# Patient Record
Sex: Female | Born: 2000
Health system: Southern US, Community
[De-identification: ages and names within clinical notes are randomized; demographics above are authoritative.]

## PROBLEM LIST (undated history)

## (undated) DIAGNOSIS — E119 Type 2 diabetes mellitus without complications: Secondary | ICD-10-CM

## (undated) DIAGNOSIS — E079 Disorder of thyroid, unspecified: Secondary | ICD-10-CM

## (undated) DIAGNOSIS — R011 Cardiac murmur, unspecified: Secondary | ICD-10-CM

## (undated) DIAGNOSIS — D649 Anemia, unspecified: Secondary | ICD-10-CM

## (undated) HISTORY — DX: Cardiac murmur, unspecified: R01.1

## (undated) HISTORY — DX: Type 2 diabetes mellitus without complications: E11.9

## (undated) HISTORY — DX: Disorder of thyroid, unspecified: E07.9

## (undated) HISTORY — DX: Anemia, unspecified: D64.9

---
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
## 2018-01-18 ENCOUNTER — Other Ambulatory Visit: Payer: Self-pay | Admitting: Internal Medicine

## 2018-01-18 MED FILL — FIASP 100 UNIT/ML SOLN: 100 | 28 days supply | Qty: 20 | Fill #11

## 2018-01-20 MED FILL — TRI FEMYNOR 28 TABLET: 0.18/0.215/ | 84 days supply | Qty: 84 | Fill #0

## 2018-02-02 DIAGNOSIS — E1065 Type 1 diabetes mellitus with hyperglycemia: Secondary | ICD-10-CM | POA: Diagnosis not present

## 2018-02-02 DIAGNOSIS — E1042 Type 1 diabetes mellitus with diabetic polyneuropathy: Secondary | ICD-10-CM | POA: Diagnosis not present

## 2018-02-16 DIAGNOSIS — E1065 Type 1 diabetes mellitus with hyperglycemia: Secondary | ICD-10-CM | POA: Diagnosis not present

## 2018-02-16 DIAGNOSIS — E1042 Type 1 diabetes mellitus with diabetic polyneuropathy: Secondary | ICD-10-CM | POA: Diagnosis not present

## 2018-02-16 MED FILL — FIASP 100 UNIT/ML SOLN: 100 | 28 days supply | Qty: 20 | Fill #12

## 2018-03-14 ENCOUNTER — Encounter: Payer: Self-pay | Admitting: Internal Medicine

## 2018-03-14 ENCOUNTER — Ambulatory Visit (INDEPENDENT_AMBULATORY_CARE_PROVIDER_SITE_OTHER): Payer: 59 | Admitting: Internal Medicine

## 2018-03-14 ENCOUNTER — Encounter

## 2018-03-14 VITALS — BP 118/60 | HR 68 | Ht 63.0 in | Wt 170.0 lb

## 2018-03-14 DIAGNOSIS — E039 Hypothyroidism, unspecified: Secondary | ICD-10-CM

## 2018-03-14 DIAGNOSIS — E1042 Type 1 diabetes mellitus with diabetic polyneuropathy: Secondary | ICD-10-CM

## 2018-03-14 LAB — TSH: TSH: 5.29 u[IU]/mL — AB (ref 0.40–5.00)

## 2018-03-14 LAB — POCT GLYCOSYLATED HEMOGLOBIN (HGB A1C): Hemoglobin A1C: 8 % — AB (ref 4.0–5.6)

## 2018-03-14 LAB — T4, FREE: Free T4: 0.95 ng/dL (ref 0.60–1.60)

## 2018-03-14 MED ORDER — GLUCAGON 3 MG/DOSE NA POWD: 3.0000 mg | Freq: Once | NASAL | 11 refills | Status: DC | PRN

## 2018-03-14 MED FILL — BAQSIMI TWO PACK 3 MG/DOSE: 3 | 30 days supply | Qty: 2 | Fill #0

## 2018-03-14 NOTE — Progress Notes (Signed)
Patient ID: [name], female   DOB: 2000-04-29, 18 y.o.   MRN: [id number]   HPI: Melissa Reese is a 18 y.o.-year-old female, referred by her PCP, Dr. Juleen Reese, for management of DM1  Dx 2003, uncontrolled, with complications (PN). Prev. seen at [facility], but has not followed with them for a longer period of time. Last visit with me 11 months ago.     She is now in the second year of college and living out of campus.  Schedule is still very busy, but at least she now has time to do her own grocery and occasionally cook meals.  She is on an insulin pump - Started at 18 years old - OneTouch ping - started 01/2015 - Currently on 670G Medtronic + CGM - started 09/2016 (band aid to keep the CGM in place: Simplatch). She had a crck in her pump - new pump 01/2018.  She was off the CGM before and over the holidays, now back on the CGM starting 03/2018.   She uses Humalog in the pump.  DM1:  Last hemoglobin A1c was: Lab Results  Component Value Date   HGBA1C 8.7 (H) 08/24/2017   HGBA1C 8.9 04/09/2017   HGBA1C 8.4 12/22/2016  Prev.  02/20/2016: HbA1c 7.2% 11/21/2015: HbA1c 7.4% 08/21/2015: HbA1c 7.7% 04/24/2015: HbA1c 7.8%  Pump settings: - basal rates: 12 am: 1.4 units/h 3 am: 1.3 6 am: 1.3 11 am: 1.1 5 pm: 1.15 9 pm: 1.25 - ICR:   12 am: 6  5:30 am: 6  10 am: 6  3 pm: 5 - target: I recommended to change these at last visit but she did not use the recommended targets as she forgot to enter them in the new pump: 12 am: 130-150  5:30 am: 115-135 9 pm: 130-150 - ISF:   12 am: 45  5:30: 40  9 pm: 45 - Insulin on Board: 4h - bolus wizard: on TDD from basal insulin: 51% >> 77% (30 units TDD from bolus insulin: 49% >> 23% (9 units) TDD only 40 units now, but she will need to use much more than this if she restarts bolusing consistently - extended bolusing: not using - changes infusion site: q 4 days, however, she only changes the insulin at approximately 7  days  Pt checks her sugars 1.5 times a day and they are:   CGM parameters:  - Average from CGM: 162+/-61 >> 190+/-70 >> 174+/-72 - Average from manual BG checks: 210+/-87 >> 276+/-106 >> 197+/-86  Time in range:  - very low (40-50): 0% >> 0% >> 1% - low (50-70): 4% >> 1% >> 2% - normal range (70-180): 63% >> 49% >> 51% - high sugars (180-250): 23% >> 30% >> 32% - very high sugars (250-400): 10% >> 20% >> 14%  - in auto mode: 13% >> 25% >> 0% - in manual mode: 87% >> 75% >> 100%  Lowest sugar: 55 at night >> 40s >> 40s. She has hypoglycemia awareness at 80. + prev. hypoglycemia visit to ED -but years ago.  She does have a non-expired glucagon kit at home. She used this when younger - 2x, not recently. Highest sugar: 377 >> >400 >> low 300s.  No prev.DKA admissions.  - No CKD, last BUN/creatinine:  Lab Results  Component Value Date   BUN 7 08/24/2017   BUN 9 09/07/2016   CREATININE 0.84 08/24/2017   CREATININE 0.98 09/07/2016  Not on an ACEI/ARB. - No HL; last set of  lipids: Lab Results  Component Value Date   CHOL 210 (H) 08/24/2017   HDL 52.20 08/24/2017   LDLCALC 128 (H) 08/24/2017   TRIG 152.0 (H) 08/24/2017   CHOLHDL 4 08/24/2017  08/21/2015: 145/148/45/76 Not on a statin. - last eye exam was in 07/2017: No DR - + numbness and tingling in her feet.  Off Neurontin.  Hypothyroidism:  Pt is on levothyroxine 150 mcg daily, taken: - in am - fasting - at least 30 min from b'fast - no Ca, Fe, MVI, PPIs - not on Biotin  Latest TSH normal: Lab Results  Component Value Date   TSH 4.16 10/11/2017   TSH 21.48 (H) 08/24/2017   TSH 0.813 04/09/2017   TSH 0.20 (L) 12/22/2016   TSH 44.95 Repeated and verified X2. (H) 09/17/2016   TSH 33.00 (H) 09/07/2016  11/21/2015: TSH 6.397 08/21/2015: TSH 41.268 04/24/2015: TSH 14.788 08/16/2014: TSH 0.407  Pt denies: - feeling nodules in neck - hoarseness - dysphagia - choking - SOB with lying down  Patient started  college in August 2018.  ROS: Constitutional: no weight gain/no weight loss, + fatigue, no subjective hyperthermia, no subjective hypothermia Eyes: no blurry vision, no xerophthalmia ENT: no sore throat, + see HPI Cardiovascular: no CP/no SOB/no palpitations/no leg swelling Respiratory: no cough/no SOB/no wheezing Gastrointestinal: no N/no V/no D/no C/no acid reflux Musculoskeletal: no muscle aches/no joint aches Skin: no rashes, no hair loss Neurological: no tremors/+ numbness/+ tingling/no dizziness  I reviewed pt's medications, allergies, PMH, social hx, family hx, and changes were documented in the history of present illness. Otherwise, unchanged from my initial visit note.  Past Medical History:  Diagnosis Date  . Diabetes mellitus without complication ([place])   . Thyroid disease     Social History   Social History  . Marital status: Single    Spouse name: N/A  . Number of children: 0   Occupational History  . student   Social History Main Topics  . Smoking status: Never Smoker  . Smokeless tobacco: Never Used  . Alcohol use No  . Drug use: No   Current Outpatient Medications on File Prior to Visit  Medication Sig Dispense Refill  . azelastine (ASTELIN) 0.1 % nasal spray     . FIASP 100 UNIT/ML injection Use up to 70 units per day in pump 120 mL 11  . glucose blood (CONTOUR NEXT TEST) test strip Use as instructed to check sugar 4 times daily 400 each 5  . insulin lispro (HUMALOG) 100 UNIT/ML injection Use up to 90 units a day in the pump 30 mL 1  . levothyroxine (SYNTHROID, LEVOTHROID) 150 MCG tablet TAKE 1 TABLET (150 MCG TOTAL) BY MOUTH DAILY. 30 tablet 2  . TRI FEMYNOR 0.18/0.215/0.25 MG-35 MCG tablet Take 1 tablet by mouth daily.  99   No current facility-administered medications on file prior to visit.    No Known Allergies Family History  Problem Relation Age of Onset  . Allergies Father    PE: BP 118/60   Pulse 68   Ht '5\' 3"'  (1.6 m) Comment: measured   Wt 170 lb (77.1 kg)   SpO2 97%   BMI 30.11 kg/m  Wt Readings from Last 3 Encounters:  03/14/18 170 lb (77.1 kg) (92 %, Z= 1.38)*  08/24/17 170 lb 6.4 oz (77.3 kg) (92 %, Z= 1.42)*  04/09/17 164 lb (74.4 kg) (90 %, Z= 1.30)*   * Growth percentiles are based on CDC (Girls, 2-20 Years) data.   Constitutional:  overweight, in NAD Eyes: PERRLA, EOMI, no exophthalmos ENT: moist mucous membranes, no thyromegaly, no cervical lymphadenopathy Cardiovascular: RRR, No MRG Respiratory: CTA B Gastrointestinal: abdomen soft, NT, ND, BS+ Musculoskeletal: no deformities, strength intact in all 4 Skin: moist, warm, no rashes Neurological: no tremor with outstretched hands, DTR normal in all 4  ASSESSMENT: 1. DM1, uncontrolled, with complications - PN  2. Hypothyroidism  PLAN:  1. Patient with longstanding, uncontrolled, type 1 diabetes, on insulin pump therapy, returning after long absence.  She is in college in [place] (approximately 30 minutes away from here) and she feels that she is doing a little better with her meals compared to last visit.  She is usually using low carb meals and she is barely bolusing with him.  We discussed that if she eats a high-protein diet, she will need to enter approximately 50% of the protein amount is carbs.  We reviewed together her pump and CGM downloads (which was scanned) and I demonstrated to her how high her sugars get after a meal if she does not bolus for them.  She has days in which she does not bolus at all and if she does, she only does few units, without using the bolus wizard.  I explained that before every meal she will need the number of carbs, the sugar, and the bolus started at 15 minutes before each meal, ideally.  She will try to do so. -Another possible problem is that she is changing her insulin in the reservoir approximately once a week and I advised her that this is not acceptable as the insulin may be great.  We discussed about how much insulin  to begin the pump if she plans to change the reservoir every 3 days.  -I again advised her to lower the targets as I suggested at last visit but she did not do yet. -It is difficult for me to evaluate her insulin to carb ratio since she is not bolusing correctly with meals. -We will continue the same basal rates for now. -Ideally she will stay in the auto mode.  As of now, she is 100% in the manual mode.  She agrees with this and will try to connected to the auto mode. - We discussed about changes to his insulin regimen, as follows:  Patient Instructions  Please continue - basal rates: 12 am: 1.4 units/h 3 am: 1.3 6 am: 1.3 11 am: 1.1 5 pm: 1.15 9 pm: 1.25 - ICR:   12 am: 6  5:30 am: 6  10 am: 6  3 pm: 5 - target:  12 am: 130-150 >> 130-130 5:30 am: 115-135 >> 115-115 9 pm: 130-150 >> 130-130 - ISF:   12 am: 45  5:30: 40  9 pm: 45 - Insulin on Board: 4h - bolus wizard: on  Please do the following approximately 15 minutes before every meal: - Enter carbs (C) - Enter sugars (S) - Start insulin bolus (I)  If you have a low carb, high protein meal, may need to enter 50% protein quantity as carbs.  - today, HbA1c is 8% (better) - continue checking sugars at different times of the day - check 4x a day, rotating checks - advised for yearly eye exams >> she is UTD -Sent a glucagon prescription to her pharmacy.  We will try the intranasal glucagon. - Return to clinic in 4 mo with sugar log    2. Hypothyroidism Patient with poorly controlled longstanding hypothyroidism, on levothyroxine therapy.  She had very  fluctuating TFTs in the past, likely secondary to lack of compliance with the levothyroxine dose. - latest thyroid labs reviewed with pt >> normal, but with a TSH of the upper limit of normal in 09/2017 - she continues on LT4 150 mcg daily - pt feels good on this dose, but does feel fatigue and she would like the test rechecked today. - we discussed about taking the  thyroid hormone every day, with water, >30 minutes before breakfast, separated by >4 hours from acid reflux medications, calcium, iron, multivitamins. Pt. is taking it correctly. - will check thyroid tests today: TSH and fT4 - If labs are abnormal, she will need to return for repeat TFTs in 1.5 months  - time spent with the patient: 40 min, of which >50% was spent in reviewing her pump and CGM downloads, discussing her hypo- and hyper-glycemic episodes, reviewing previous labs and pump settings and developing a plan to avoid hypo- and hyper-glycemia.  We also addressed her hypothyroidism.  Component     Latest Ref Rng & Units 03/14/2018  TSH     0.40 - 5.00 uIU/mL 5.29 (H)  T4,Free(Direct)     0.60 - 1.60 ng/dL 0.95   TSH slightly high >> will increase LT4 to 175 mcg daily. Needs repeat TFTs in 1.5 mo.  Philemon Kingdom, MD PhD [facility]

## 2018-03-14 NOTE — Patient Instructions (Addendum)
Please continue - basal rates: 12 am: 1.4 units/h 3 am: 1.3 6 am: 1.3 11 am: 1.1 5 pm: 1.15 9 pm: 1.25 - ICR:   12 am: 6  5:30 am: 6  10 am: 6  3 pm: 5 - target:  12 am: 130-150 >> 130-130 5:30 am: 115-135 >> 115-115 9 pm: 130-150 >> 130-130 - ISF:   12 am: 45  5:30: 40  9 pm: 45 - Insulin on Board: 4h - bolus wizard: on  Please do the following approximately 15 minutes before every meal: - Enter carbs (C) - Enter sugars (S) - Start insulin bolus (I)  If you have a low carb, high protein meal, may need to enter 50% protein quantity as carbs.

## 2018-03-15 MED ORDER — LEVOTHYROXINE SODIUM 175 MCG PO TABS: 175.0000 ug | ORAL_TABLET | Freq: Every day | ORAL | 3 refills | Status: DC

## 2018-03-15 MED FILL — LEVOTHYROXINE 175 MCG TABLE: 175 | 45 days supply | Qty: 45 | Fill #0

## 2018-03-15 MED FILL — FIASP 100 UNIT/ML SOLN: 100 | 28 days supply | Qty: 20 | Fill #13

## 2018-04-14 ENCOUNTER — Other Ambulatory Visit: Payer: Self-pay | Admitting: Internal Medicine

## 2018-04-15 ENCOUNTER — Telehealth: Payer: Self-pay | Admitting: Internal Medicine

## 2018-04-15 ENCOUNTER — Telehealth: Payer: Self-pay

## 2018-04-15 ENCOUNTER — Ambulatory Visit (INDEPENDENT_AMBULATORY_CARE_PROVIDER_SITE_OTHER): Payer: Self-pay | Admitting: Nurse Practitioner

## 2018-04-15 VITALS — BP 116/72 | HR 58 | Temp 98.6°F | Resp 20 | Wt 166.0 lb

## 2018-04-15 DIAGNOSIS — B9689 Other specified bacterial agents as the cause of diseases classified elsewhere: Secondary | ICD-10-CM

## 2018-04-15 DIAGNOSIS — J019 Acute sinusitis, unspecified: Secondary | ICD-10-CM

## 2018-04-15 MED ORDER — AMOXICILLIN-POT CLAVULANATE 875-125 MG PO TABS: 1.0000 | ORAL_TABLET | Freq: Two times a day (BID) | ORAL | 0 refills | Status: AC

## 2018-04-15 MED ORDER — IPRATROPIUM BROMIDE 0.03 % NA SOLN: 2.0000 | Freq: Two times a day (BID) | NASAL | 12 refills | Status: DC

## 2018-04-15 MED FILL — FIASP 100 UNIT/ML SOLN: 100 | 28 days supply | Qty: 20 | Fill #0

## 2018-04-15 MED FILL — IPRATROPIUM 0.03% SPRAY: 0.03 | 43 days supply | Qty: 30 | Fill #0

## 2018-04-15 MED FILL — AMOX-CLAV 875-125 MG TABLET: 875-125 | 7 days supply | Qty: 14 | Fill #0

## 2018-04-15 NOTE — Progress Notes (Signed)
Subjective:    Patient ID: Melissa Reese, female    DOB: 2000/09/24, 18 y.o.   MRN: [id number]  The patient is a 18 year old female who presents today for complaints of headache, cough, nasal congestion, and sinus pressure.  Patient informs her symptoms have been going on for about 7 days.  The patient denies fever, ear pain, ear drainage, productive cough, abdominal pain, nausea or vomiting.  Patient also complains of postnasal drip.  The patient does have a long history of type 1 diabetes, since the age of 18, which has been uncontrolled.  The patient informs that this morning she did have some hypoglycemic episodes with her blood sugars running in the 60s.  Patient states that she had to eat to get her blood sugars up, and her blood sugars currently 257 in the office.  Patient also has a history of hypothyroidism for which she takes Synthroid for. Patient's synthroid dose was also adjusted in January, 2020. The patient's last A1c in January, 2020 was 8.  Patient informs that when she gets sick her blood sugars normally tend to trend low instead of trending high compared to most diabetics.  States her blood sugars have been trending low over the past week.  The patient is in college, and stays off campus.  Patient states several of her cheerleader friends have been sick with same or similar symptoms.    Sinusitis  This is a new problem. The current episode started 1 to 4 weeks ago. The problem has been gradually worsening since onset. There has been no fever. Her pain is at a severity of 6/10. The pain is moderate. Associated symptoms include congestion, coughing, headaches, sinus pressure and a sore throat ( worsens with cough, "feels like a tickle in the back of my throat"). Pertinent negatives include no chills, ear pain, hoarse voice or shortness of breath. Treatments tried: Robitussin cough medicine at bedtime. The treatment provided mild relief.   Past Medical History:  Diagnosis Date  .  Diabetes mellitus without complication ([facility])   . Thyroid disease     Review of Systems  Constitutional: Positive for fatigue. Negative for activity change, appetite change, chills and fever.  HENT: Positive for congestion, postnasal drip, rhinorrhea, sinus pressure, sinus pain and sore throat ( worsens with cough, "feels like a tickle in the back of my throat"). Negative for ear pain and hoarse voice.   Eyes: Negative.   Respiratory: Positive for cough. Negative for shortness of breath and wheezing.   Cardiovascular: Negative.   Gastrointestinal: Negative.   Skin: Negative.   Allergic/Immunologic: Negative.   Neurological: Positive for headaches. Negative for dizziness, tremors, seizures, facial asymmetry, weakness, light-headedness and numbness.       Objective:   Physical Exam Vitals signs reviewed.  Constitutional:      General: She is not in acute distress. HENT:     Head: Normocephalic.     Right Ear: Tympanic membrane, ear canal and external ear normal.     Left Ear: Tympanic membrane, ear canal and external ear normal.     Nose: Mucosal edema and congestion (moderate) present. No rhinorrhea.     Right Turbinates: Enlarged and swollen.     Left Turbinates: Enlarged and swollen.     Right Sinus: Maxillary sinus tenderness and frontal sinus tenderness present.     Left Sinus: Maxillary sinus tenderness and frontal sinus tenderness present.     Mouth/Throat:     Lips: Pink.     Mouth: Mucous  membranes are moist.     Pharynx: Posterior oropharyngeal erythema present. No pharyngeal swelling, oropharyngeal exudate or uvula swelling.     Tonsils: No tonsillar exudate or tonsillar abscesses. Swelling: 0 on the right. 0 on the left.  Eyes:     Pupils: Pupils are equal, round, and reactive to light.  Neurological:     Mental Status: She is alert.       Assessment & Plan:   Exam findings, diagnosis etiology and medication use and indications reviewed with patient. Follow- Up  and discharge instructions provided. No emergent/urgent issues found on exam.  Based on the patient's clinical presentation, symptoms, physical assessment, and contributing past medical history, I am going to go ahead and place the patient on antibiotics for her symptoms.  Patient symptoms are likely bacterial, but I would like to cover the patient as she is immunocompromised and do not want to risk any co-infection or secondary infection..  The patient has moderate sinus tenderness and gradual worsening of symptoms. Patient will continue Robitussin for her cough and will utilize symptomatic treatment to include Ibuprofen or Tylenol for pain, fever or general discomfort, increase fluids, get plenty of rest, nasal spray and normal saline nasal spray for congestion.  I have asked the patient to also monitor her blood sugars closely for hypo/hyperglycemic episodes.  If patient does not improve, I would like to her to follow up with her PCP or endocrinologist accordingly.   Patient education was provided. Patient verbalized understanding of information provided and agrees with plan of care (POC), all questions answered. The patient is advised to call or return to clinic if condition does not see an improvement in symptoms, or to seek the care of the closest emergency department if condition worsens with the above plan.   1. Acute bacterial sinusitis  - amoxicillin-clavulanate (AUGMENTIN) 875-125 MG tablet; Take 1 tablet by mouth 2 (two) times daily for 7 days.  Dispense: 14 tablet; Refill: 0 - ipratropium (ATROVENT) 0.03 % nasal spray; Place 2 sprays into both nostrils every 12 (twelve) hours.  Dispense: 30 mL; Refill: 12 -Take medication as prescribed. -Ibuprofen or Tylenol for pain, fever, or general discomfort. -Increase fluids. -Sleep elevated on at least 2 pillows at bedtime to help with cough. -Use a humidifier or vaporizer when at home and during sleep. -May use a teaspoon of honey or over-the-counter  cough drops to help with cough. -May use normal saline nasal spray to help with nasal congestion throughout the day. -Monitor your blood glucose closely for hypo/hyperglycemia.  Follow up with PCP or endocrinologist if symptoms worsen or do not improve.  -Follow-up with your PCP or endocrinologist if symptoms do not improve.

## 2018-04-15 NOTE — Telephone Encounter (Signed)
Notified patient and her father of message from Dr. Elvera Lennox, they both expressed understanding and agreement. No further questions.

## 2018-04-15 NOTE — Telephone Encounter (Signed)
Called and left voicemail for pt's father to return our call.

## 2018-04-15 NOTE — Telephone Encounter (Signed)
Dad has returned the call from [place].    Please call [phone]

## 2018-04-15 NOTE — Telephone Encounter (Signed)
error 

## 2018-04-15 NOTE — Telephone Encounter (Signed)
Called pt's father back after I missed his call, and he stated that he was with the pt now and she is doing better. Pt stated that she has not set up her account yet so she can upload her readings, but she is coming to her PCP in [place] today because she has a "severe" cold. Pt's father stated that he would come here first and allow staff in the office to download her pump for Dr. Elvera Lennox to see the readings. Pt was also scheduled for 04/19/2018 at 0930 to see Dr. Elvera Lennox.

## 2018-04-15 NOTE — Telephone Encounter (Signed)
Can she upload her pump? I would like to look at the patterns before the weekend?  Any possible culprits or particular patternsshe can identify? If she cannot download >> are sugars dropping before or after meals or at night?   If she cannot upload, I would like to see her and download her pump here - 9:30 on Tue? - be here earlier (latest: 9:10 to download her pump)

## 2018-04-15 NOTE — Telephone Encounter (Signed)
Per Dr. Elvera Lennox:  Patient needs to change ALL insulin to carb ratio to 1:7  LM for patient's father to return my call.

## 2018-04-15 NOTE — Telephone Encounter (Signed)
Pt's father stated that his daughter's blood sugars have been running very low the last day or so. He stated that last night, her blood sugars were consistently running 35-40 throughout the night.  This morning and currently, her blood sugars have been running anywhere between 35-65. Her blood sugar now is 65 and the patient is currently drinking Orange Juice.  Pt's father stated that prior to calling, the patient was incoherent and vomiting, but currently experiencing no vomiting as of this time.  Pt's father was advised that calling EMS would be the best option at this time for the patient. Pt's father denied needing EMS at this current time and stated that his daughter was 30 minutes away at college, and he was on his way to her at this present time.  Pt's father did question about the recall on the Medtronic 670 insulin pump, which the patient is currently using. Pt's father stated that he would immediately check the plastic piece that was recalled on the insulin pump and call us and then [employer]. Pt's father questioned whether he should bring the pt here, and he was advised that there is little we can do for her with blood sugars at this level, and EMS would be better equipped to handle her condition as it is currently.  Pt's father verbalized understanding. He was then advised that if he was uncertain of anything at all, and if the pt's condition worsens, if BS drops again, if vomiting resumes, or if he or the pt is concerned in any way further, immediately call EMS at 911 and have the pt taken to the hospital. Pt's father verbalized understanding of this, and once he is with the pt, he will call and update.

## 2018-04-15 NOTE — Telephone Encounter (Signed)
OK, Thank you, Anette Riedel!

## 2018-04-15 NOTE — Patient Instructions (Addendum)
Sinusitis, Adult -Take medication as prescribed. -Ibuprofen or Tylenol for pain, fever, or general discomfort. -Increase fluids. -Sleep elevated on at least 2 pillows at bedtime to help with cough. -Use a humidifier or vaporizer when at home and during sleep. -May use a teaspoon of honey or over-the-counter cough drops to help with cough. -May use normal saline nasal spray to help with nasal congestion throughout the day. -Monitor your blood glucose closely for hypo/hyperglycemia.  Follow up with PCP or endocrinologist if symptoms worsen or do not improve.  -Follow-up with your PCP or endocrinologist if symptoms do not improve.    Sinusitis is inflammation of your sinuses. Sinuses are hollow spaces in the bones around your face. Your sinuses are located:  Around your eyes.  In the middle of your forehead.  Behind your nose.  In your cheekbones. Mucus normally drains out of your sinuses. When your nasal tissues become inflamed or swollen, mucus can become trapped or blocked. This allows bacteria, viruses, and fungi to grow, which leads to infection. Most infections of the sinuses are caused by a virus. Sinusitis can develop quickly. It can last for up to 4 weeks (acute) or for more than 12 weeks (chronic). Sinusitis often develops after a cold. What are the causes? This condition is caused by anything that creates swelling in the sinuses or stops mucus from draining. This includes:  Allergies.  Asthma.  Infection from bacteria or viruses.  Deformities or blockages in your nose or sinuses.  Abnormal growths in the nose (nasal polyps).  Pollutants, such as chemicals or irritants in the air.  Infection from fungi (rare). What increases the risk? You are more likely to develop this condition if you:  Have a weak body defense system (immune system).  Do a lot of swimming or diving.  Overuse nasal sprays.  Smoke. What are the signs or symptoms? The main symptoms of this  condition are pain and a feeling of pressure around the affected sinuses. Other symptoms include:  Stuffy nose or congestion.  Thick drainage from your nose.  Swelling and warmth over the affected sinuses.  Headache.  Upper toothache.  A cough that may get worse at night.  Extra mucus that collects in the throat or the back of the nose (postnasal drip).  Decreased sense of smell and taste.  Fatigue.  A fever.  Sore throat.  Bad breath. How is this diagnosed? This condition is diagnosed based on:  Your symptoms.  Your medical history.  A physical exam.  Tests to find out if your condition is acute or chronic. This may include: ? Checking your nose for nasal polyps. ? Viewing your sinuses using a device that has a light (endoscope). ? Testing for allergies or bacteria. ? Imaging tests, such as an MRI or CT scan. In rare cases, a bone biopsy may be done to rule out more serious types of fungal sinus disease. How is this treated? Treatment for sinusitis depends on the cause and whether your condition is chronic or acute.  If caused by a virus, your symptoms should go away on their own within 10 days. You may be given medicines to relieve symptoms. They include: ? Medicines that shrink swollen nasal passages (topical intranasal decongestants). ? Medicines that treat allergies (antihistamines). ? A spray that eases inflammation of the nostrils (topical intranasal corticosteroids). ? Rinses that help get rid of thick mucus in your nose (nasal saline washes).  If caused by bacteria, your health care provider may recommend waiting  to see if your symptoms improve. Most bacterial infections will get better without antibiotic medicine. You may be given antibiotics if you have: ? A severe infection. ? A weak immune system.  If caused by narrow nasal passages or nasal polyps, you may need to have surgery. Follow these instructions at home: Medicines  Take, use, or apply  over-the-counter and prescription medicines only as told by your health care provider. These may include nasal sprays.  If you were prescribed an antibiotic medicine, take it as told by your health care provider. Do not stop taking the antibiotic even if you start to feel better. Hydrate and humidify   Drink enough fluid to keep your urine pale yellow. Staying hydrated will help to thin your mucus.  Use a cool mist humidifier to keep the humidity level in your home above 50%.  Inhale steam for 10-15 minutes, 3-4 times a day, or as told by your health care provider. You can do this in the bathroom while a hot shower is running.  Limit your exposure to cool or dry air. Rest  Rest as much as possible.  Sleep with your head raised (elevated).  Make sure you get enough sleep each night. General instructions   Apply a warm, moist washcloth to your face 3-4 times a day or as told by your health care provider. This will help with discomfort.  Wash your hands often with soap and water to reduce your exposure to germs. If soap and water are not available, use hand sanitizer.  Do not smoke. Avoid being around people who are smoking (secondhand smoke).  Keep all follow-up visits as told by your health care provider. This is important. Contact a health care provider if:  You have a fever.  Your symptoms get worse.  Your symptoms do not improve within 10 days. Get help right away if:  You have a severe headache.  You have persistent vomiting.  You have severe pain or swelling around your face or eyes.  You have vision problems.  You develop confusion.  Your neck is stiff.  You have trouble breathing. Summary  Sinusitis is soreness and inflammation of your sinuses. Sinuses are hollow spaces in the bones around your face.  This condition is caused by nasal tissues that become inflamed or swollen. The swelling traps or blocks the flow of mucus. This allows bacteria, viruses,  and fungi to grow, which leads to infection.  If you were prescribed an antibiotic medicine, take it as told by your health care provider. Do not stop taking the antibiotic even if you start to feel better.  Keep all follow-up visits as told by your health care provider. This is important. This information is not intended to replace advice given to you by your health care provider. Make sure you discuss any questions you have with your health care provider. Document Released: 02/16/2005 Document Revised: 07/19/2017 Document Reviewed: 07/19/2017 Elsevier Interactive Patient Education  2019 [employer].

## 2018-04-18 MED FILL — TRI FEMYNOR 28 TABLET: 0.18/0.215/ | 84 days supply | Qty: 84 | Fill #0

## 2018-04-19 ENCOUNTER — Ambulatory Visit: Payer: 59 | Admitting: Internal Medicine

## 2018-04-25 DIAGNOSIS — E1042 Type 1 diabetes mellitus with diabetic polyneuropathy: Secondary | ICD-10-CM | POA: Diagnosis not present

## 2018-04-25 DIAGNOSIS — E1065 Type 1 diabetes mellitus with hyperglycemia: Secondary | ICD-10-CM | POA: Diagnosis not present

## 2018-05-02 MED FILL — LEVOTHYROXINE 175 MCG TABLE: 175 | 45 days supply | Qty: 45 | Fill #1 | Status: TO

## 2018-05-11 ENCOUNTER — Other Ambulatory Visit: Payer: Self-pay

## 2018-05-11 ENCOUNTER — Ambulatory Visit (INDEPENDENT_AMBULATORY_CARE_PROVIDER_SITE_OTHER): Payer: Self-pay | Admitting: Nurse Practitioner

## 2018-05-11 ENCOUNTER — Ambulatory Visit: Payer: Self-pay

## 2018-05-11 VITALS — BP 110/70 | HR 100 | Temp 99.0°F | Resp 17 | Wt 164.2 lb

## 2018-05-11 DIAGNOSIS — R6889 Other general symptoms and signs: Secondary | ICD-10-CM

## 2018-05-11 MED ORDER — FLUTICASONE PROPIONATE 50 MCG/ACT NA SUSP: 2.0000 | Freq: Every day | NASAL | 0 refills | Status: DC

## 2018-05-11 MED ORDER — OSELTAMIVIR PHOSPHATE 75 MG PO CAPS: 75.0000 mg | ORAL_CAPSULE | Freq: Two times a day (BID) | ORAL | 0 refills | Status: AC

## 2018-05-11 MED FILL — FLUTICASONE PROP 50 MCG SPR: 50 | 30 days supply | Qty: 16 | Fill #0

## 2018-05-11 MED FILL — OSELTAMIVIR PHOSPHATE 75 MG: 75 | 5 days supply | Qty: 10 | Fill #0

## 2018-05-11 NOTE — Progress Notes (Signed)
MRN: [id number] DOB: Oct 06, 2000  Subjective:   Melissa Reese is a 18 y.o. female presenting for chief complaint of Cough (YESTERDAY); Sore Throat (YESTERDAY); Headache (YESTERDAY); Generalized Body Aches (YESTERDAY); Fatigue (YESTERDAY); Chills (YESTERDAY); Nasal Congestion (YESTERDAY); and Nausea (YESTERDAY) .  Reports 1 day history of sinus headache, sinus congestion , sore throat, dry cough and myalgia, chills, fatigue, malaise and nausea. Has tried Aleve for relief. Denies itchy watery eyes, red eyes, ear fullness, ear drainage, inability to swallow, voice change, dry cough, productive cough, wheezing, shortness of breath and chest tightness, night sweats, weight loss, abdominal pain and diarrhea. 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  Denies have a history of seasonal allergies, but does admit to recurrence of allergy symptoms, denies history of asthma. Patient has had a flu shot this season. Denies smoking. Denies any other aggravating or relieving factors, no other questions or concerns. Patient has a history of Type 1 diabetes, which is uncontrolled.  Patient informs her blood sugars have been "running in the 100s" since her symptoms started.  Review of Systems  Constitutional: Positive for chills, fever and malaise/fatigue.  HENT: Positive for congestion, sinus pain and sore throat. Negative for ear discharge and ear pain.   Eyes: Negative.   Respiratory: Negative.   Cardiovascular: Negative.   Musculoskeletal: Positive for myalgias.  Skin: Negative.   Neurological: Positive for headaches. Negative for dizziness, tingling, tremors, sensory change, speech change and focal weakness.  Endo/Heme/Allergies: Negative for environmental allergies.    Melissa Reese has a current medication list which includes the following prescription(s): fiasp, glucagon, glucose blood, insulin lispro, levothyroxine, tri femynor, azelastine, and ipratropium. Also has No Known  Allergies.  Melissa Reese  has a past medical history of Diabetes mellitus without complication (HCC) and Thyroid disease. Also  has no past surgical history on file.   Objective:   Vitals: BP 110/70 (BP Location: Right Arm, Patient Position: Sitting, Cuff Size: Normal)   Pulse 100   Temp 99 F (37.2 C) (Oral)   Resp 17   Wt 164 lb 3.2 oz (74.5 kg)   SpO2 98%   BMI 29.09 kg/m   Physical Exam Vitals signs reviewed.  Constitutional:      General: She is not in acute distress. HENT:     Head: Normocephalic.     Right Ear: Tympanic membrane and ear canal normal. No middle ear effusion.     Left Ear: Tympanic membrane and ear canal normal.  No middle ear effusion.     Nose: Congestion (moderate) and rhinorrhea (clear nasal drainage) present.     Mouth/Throat:     Mouth: Mucous membranes are moist.     Pharynx: Uvula midline. Posterior oropharyngeal erythema present. No pharyngeal swelling, oropharyngeal exudate or uvula swelling.  Eyes:     Pupils: Pupils are equal, round, and reactive to light.  Neck:     Musculoskeletal: Normal range of motion and neck supple.  Cardiovascular:     Rate and Rhythm: Normal rate and regular rhythm.     Pulses: Normal pulses.     Heart sounds: Normal heart sounds.  Pulmonary:     Effort: Pulmonary effort is normal. No respiratory distress.     Breath sounds: Normal breath sounds. No stridor. No wheezing, rhonchi or rales.  Abdominal:     General: Bowel sounds are normal.     Palpations: Abdomen is soft.     Tenderness: There is no abdominal tenderness.  Lymphadenopathy:     Cervical: No  cervical adenopathy.  Skin:    General: Skin is warm and dry.     Capillary Refill: Capillary refill takes less than 2 seconds.     Findings: No rash.  Neurological:     General: No focal deficit present.     Mental Status: She is alert and oriented to person, place, and time.     Cranial Nerves: No cranial nerve deficit.  Psychiatric:        Mood and Affect:  Mood normal.        Behavior: Behavior normal.    Assessment and Plan :   Exam findings, diagnosis etiology and medication use and indications reviewed with patient. Follow- Up and discharge instructions provided. No emergent/urgent issues found on exam. Based on the patient's presenting symptoms, physical assessment and clinical presentation, patient's findings are consistent with influenza-like symptoms.  I am going to treat the patient with Tamiflu given her history of uncontrolled DM and thyroid disease as she is "high-risk".  Patient will also be given Flonase for her nasal congestion.  Patient was instructed to get plenty of rest and increase fluids, use Ibuprofen or Tylenol for pain, fever or general discomfort. The patient is well-appearing, is in no acute distress, vitals are stable. Patient education was provided. Patient verbalized understanding of information provided and agrees with plan of care (POC), all questions answered. The patient is advised to call or return to clinic if condition does not see an improvement in symptoms, or to seek the care of the closest emergency department if condition worsens with the above plan.   1. Influenza-like symptoms  - oseltamivir (TAMIFLU) 75 MG capsule; Take 1 capsule (75 mg total) by mouth 2 (two) times daily for 5 days.  Dispense: 10 capsule; Refill: 0 - fluticasone (FLONASE) 50 MCG/ACT nasal spray; Place 2 sprays into both nostrils daily for 10 days.  Dispense: 16 g; Refill: 0 -Take medication as prescribed. -Ibuprofen or Tylenol for pain, fever, or general discomfort. -Increase fluids. -Get plenty of rest. -Sleep elevated on at least 2 pillows at bedtime to help with cough if needed. -Use a humidifier or vaporizer when at home and during sleep to help with nasal congestion. -May use a teaspoon of honey or over-the-counter cough drops to help with cough and sore throat. -May use normal saline nasal spray to help with nasal congestion  throughout the day. -May return to class on Monday, May 16, 2018. -Follow-up if symptoms do not improve.

## 2018-05-11 NOTE — Patient Instructions (Signed)
Influenza-Like Symptoms, Adult -Take medication as prescribed. -Ibuprofen or Tylenol for pain, fever, or general discomfort. -Increase fluids. -Get plenty of rest. -Sleep elevated on at least 2 pillows at bedtime to help with cough if needed. -Use a humidifier or vaporizer when at home and during sleep to help with nasal congestion. -May use a teaspoon of honey or over-the-counter cough drops to help with cough and sore throat. -May use normal saline nasal spray to help with nasal congestion throughout the day. -May return to class on Monday, May 16, 2018. -Follow-up if symptoms do not improve.   Influenza, more commonly known as "the flu," is a viral infection that mainly affects the respiratory tract. The respiratory tract includes organs that help you breathe, such as the lungs, nose, and throat. The flu causes many symptoms similar to the common cold along with high fever and body aches. The flu spreads easily from person to person (is contagious). Getting a flu shot (influenza vaccination) every year is the best way to prevent the flu. What are the causes? This condition is caused by the influenza virus. You can get the virus by:  Breathing in droplets that are in the air from an infected person's cough or sneeze.  Touching something that has been exposed to the virus (has been contaminated) and then touching your mouth, nose, or eyes. What increases the risk? The following factors may make you more likely to get the flu:  Not washing or sanitizing your hands often.  Having close contact with many people during cold and flu season.  Touching your mouth, eyes, or nose without first washing or sanitizing your hands.  Not getting a yearly (annual) flu shot. You may have a higher risk for the flu, including serious problems such as a lung infection (pneumonia), if you:  Are older than 65.  Are pregnant.  Have a weakened disease-fighting system (immune system). You may have a  weakened immune system if you: ? Have HIV or AIDS. ? Are undergoing chemotherapy. ? Are taking medicines that reduce (suppress) the activity of your immune system.  Have a long-term (chronic) illness, such as heart disease, kidney disease, diabetes, or lung disease.  Have a liver disorder.  Are severely overweight (morbidly obese).  Have anemia. This is a condition that affects your red blood cells.  Have asthma. What are the signs or symptoms? Symptoms of this condition usually begin suddenly and last 4-14 days. They may include:  Fever and chills.  Headaches, body aches, or muscle aches.  Sore throat.  Cough.  Runny or stuffy (congested) nose.  Chest discomfort.  Poor appetite.  Weakness or fatigue.  Dizziness.  Nausea or vomiting. How is this diagnosed? This condition may be diagnosed based on:  Your symptoms and medical history.  A physical exam.  Swabbing your nose or throat and testing the fluid for the influenza virus. How is this treated? If the flu is diagnosed early, you can be treated with medicine that can help reduce how severe the illness is and how long it lasts (antiviral medicine). This may be given by mouth (orally) or through an IV. Taking care of yourself at home can help relieve symptoms. Your health care provider may recommend:  Taking over-the-counter medicines.  Drinking plenty of fluids. In many cases, the flu goes away on its own. If you have severe symptoms or complications, you may be treated in a hospital. Follow these instructions at home: Activity  Rest as needed and get plenty of  sleep.  Stay home from work or school as told by your health care provider. Unless you are visiting your health care provider, avoid leaving home until your fever has been gone for 24 hours without taking medicine. Eating and drinking  Take an oral rehydration solution (ORS). This is a drink that is sold at pharmacies and retail stores.  Drink  enough fluid to keep your urine pale yellow.  Drink clear fluids in small amounts as you are able. Clear fluids include water, ice chips, diluted fruit juice, and low-calorie sports drinks.  Eat bland, easy-to-digest foods in small amounts as you are able. These foods include bananas, applesauce, rice, lean meats, toast, and crackers.  Avoid drinking fluids that contain a lot of sugar or caffeine, such as energy drinks, regular sports drinks, and soda.  Avoid alcohol.  Avoid spicy or fatty foods. General instructions      Take over-the-counter and prescription medicines only as told by your health care provider.  Use a cool mist humidifier to add humidity to the air in your home. This can make it easier to breathe.  Cover your mouth and nose when you cough or sneeze.  Wash your hands with soap and water often, especially after you cough or sneeze. If soap and water are not available, use alcohol-based hand sanitizer.  Keep all follow-up visits as told by your health care provider. This is important. How is this prevented?   Get an annual flu shot. You may get the flu shot in late summer, fall, or winter. Ask your health care provider when you should get your flu shot.  Avoid contact with people who are sick during cold and flu season. This is generally fall and winter. Contact a health care provider if:  You develop new symptoms.  You have: ? Chest pain. ? Diarrhea. ? A fever.  Your cough gets worse.  You produce more mucus.  You feel nauseous or you vomit. Get help right away if:  You develop shortness of breath or difficulty breathing.  Your skin or nails turn a bluish color.  You have severe pain or stiffness in your neck.  You develop a sudden headache or sudden pain in your face or ear.  You cannot eat or drink without vomiting. Summary  Influenza, more commonly known as "the flu," is a viral infection that primarily affects your respiratory tract.   Symptoms of the flu usually begin suddenly and last 4-14 days.  Getting an annual flu shot is the best way to prevent getting the flu.  Stay home from work or school as told by your health care provider. Unless you are visiting your health care provider, avoid leaving home until your fever has been gone for 24 hours without taking medicine.  Keep all follow-up visits as told by your health care provider. This is important. This information is not intended to replace advice given to you by your health care provider. Make sure you discuss any questions you have with your health care provider. Document Released: 02/14/2000 Document Revised: 08/04/2017 Document Reviewed: 08/04/2017 Elsevier Interactive Patient Education  2019 [employer].

## 2018-05-12 ENCOUNTER — Ambulatory Visit: Payer: 59 | Admitting: Physician Assistant

## 2018-05-12 DIAGNOSIS — E1065 Type 1 diabetes mellitus with hyperglycemia: Secondary | ICD-10-CM | POA: Diagnosis not present

## 2018-05-12 DIAGNOSIS — E1042 Type 1 diabetes mellitus with diabetic polyneuropathy: Secondary | ICD-10-CM | POA: Diagnosis not present

## 2018-05-13 ENCOUNTER — Telehealth: Payer: Self-pay

## 2018-05-13 NOTE — Telephone Encounter (Signed)
Called and spoke with pt and she states she is feeling better.

## 2018-05-20 MED FILL — FIASP 100 UNIT/ML SOLN: 100 | 28 days supply | Qty: 20 | Fill #1 | Status: TO

## 2018-07-06 MED FILL — LEVOTHYROXINE 175 MCG TAB: 175 | 45 days supply | Qty: 45 | Fill #0

## 2018-07-06 MED FILL — FIASP 100 UNIT/ML SOLN: 100 | 28 days supply | Qty: 20 | Fill #0

## 2018-07-06 MED FILL — TRI FEMYNOR 28 TABLET: 0.18/0.215/ | 28 days supply | Qty: 28 | Fill #0

## 2018-07-15 DIAGNOSIS — E1042 Type 1 diabetes mellitus with diabetic polyneuropathy: Secondary | ICD-10-CM | POA: Diagnosis not present

## 2018-07-15 DIAGNOSIS — E1065 Type 1 diabetes mellitus with hyperglycemia: Secondary | ICD-10-CM | POA: Diagnosis not present

## 2018-07-18 ENCOUNTER — Other Ambulatory Visit: Payer: Self-pay

## 2018-07-19 ENCOUNTER — Ambulatory Visit (INDEPENDENT_AMBULATORY_CARE_PROVIDER_SITE_OTHER): Payer: 59 | Admitting: Internal Medicine

## 2018-07-19 ENCOUNTER — Encounter: Payer: Self-pay | Admitting: Internal Medicine

## 2018-07-19 VITALS — BP 108/70 | HR 85 | Temp 99.0°F | Ht 63.0 in | Wt 159.0 lb

## 2018-07-19 DIAGNOSIS — E1042 Type 1 diabetes mellitus with diabetic polyneuropathy: Secondary | ICD-10-CM

## 2018-07-19 DIAGNOSIS — E039 Hypothyroidism, unspecified: Secondary | ICD-10-CM | POA: Diagnosis not present

## 2018-07-19 LAB — COMPLETE METABOLIC PANEL WITH GFR
AG Ratio: 1.5 (calc) (ref 1.0–2.5)
ALT: 9 U/L (ref 6–29)
AST: 14 U/L (ref 10–30)
Albumin: 4.4 g/dL (ref 3.6–5.1)
Alkaline phosphatase (APISO): 71 U/L (ref 31–125)
BUN: 7 mg/dL (ref 7–25)
CO2: 28 mmol/L (ref 20–32)
Calcium: 9.8 mg/dL (ref 8.6–10.2)
Chloride: 102 mmol/L (ref 98–110)
Creat: 0.83 mg/dL (ref 0.50–1.10)
GFR, Est African American: 118 mL/min/{1.73_m2} (ref >=60)
GFR, Est Non African American: 102 mL/min/{1.73_m2} (ref >=60)
Globulin: 3 g/dL (calc) (ref 1.9–3.7)
Glucose, Bld: 133 mg/dL — ABNORMAL HIGH (ref 65–99)
Potassium: 3.9 mmol/L (ref 3.5–5.3)
Sodium: 137 mmol/L (ref 135–146)
Total Bilirubin: 0.6 mg/dL (ref 0.2–1.2)
Total Protein: 7.4 g/dL (ref 6.1–8.1)

## 2018-07-19 LAB — POCT GLYCOSYLATED HEMOGLOBIN (HGB A1C): Hemoglobin A1C: 8 % — AB (ref 4.0–5.6)

## 2018-07-19 NOTE — Patient Instructions (Addendum)
Please continue - basal rates: 12 am: 1.4 units/h 3 am: 1.3 6 am: 1.3 11 am: 1.1 5 pm: 1.15 9 pm: 1.25 - ICR:   12 am: 1.7 >> 6  5:30 am: 1.7 >> 6  10 am: 1.7 >> 6  3 pm: 1.7 >> 6 - target:  12 am: 130-150 >> 130-130 5:30 am: 115-135 >> 115-115  9 pm: 130-150 >> 130-130 - ISF:   12 am: 45  5:30: 40  9 pm: 45 - Insulin on Board: 4h - bolus wizard: on  Please do the following approximately 15 minutes before every meal: - Enter carbs (C) - Enter sugars (S) - Start insulin bolus (I)  If you have a low carb, high protein meal, may need to enter 50% protein quantity as carbs.

## 2018-07-19 NOTE — Progress Notes (Addendum)
Patient ID: Melissa Reese, female   DOB: 2000-11-27, 18 y.o.   MRN: [id number]   HPI: Melissa Reese is a 18 y.o.-year-old female, initially referred by her PCP, Dr. Juleen China, returning for follow-up for DM1  Dx 2003, uncontrolled, with complications (PN). Prev. seen at [facility], but has not followed with them for a longer period of time. Last visit with me 4 months ago.    She is in college and was really busy before the coronavirus pandemic.  Now at home.  She contacted me 04/2018 for low blood sugars during an upper respiratory infection and not eating well.  At that time, I advised her to increase her insulin to carb ratio from 1:6 to 1:7.  However, she had problems with blood sugars continue to drop significantly and she can now only introduce few carbs into the pump to avoid dropping her sugars.  She is on insulin pump: - Started at 18 years old - OneTouch ping - started 01/2015 - Currently on 670G [employer] + CGM - started 09/2016 (band aid to keep the CGM in place: Simplatch). She had a crck in her pump - new pump 01/2018.  She was off the CGM before and over the holidays, now back on the CGM starting 03/2018.  She did not use this consistently since last visit: Being mostly in the manual mode.  She uses Humalog in the pump  DM1:  Last hemoglobin A1c was: Lab Results  Component Value Date   HGBA1C 8.0 (A) 03/14/2018   HGBA1C 8.7 (H) 08/24/2017   HGBA1C 8.9 04/09/2017  Prev.  02/20/2016: HbA1c 7.2% 11/21/2015: HbA1c 7.4% 08/21/2015: HbA1c 7.7% 04/24/2015: HbA1c 7.8%  Pump settings: - basal rates: 12 am: 1.4 units/h 3 am: 1.3 6 am: 1.3 11 am: 1.1 5 pm: 1.15 9 pm: 1.25 - ICR:   12 am: 6 >> 1.7  5:30 am: 6 >> 1.7  10 am: 6 >> 1.7  3 pm: 5 >> 1.7 - target:  12 am: 130-150  (she did not change these) 5:30 am: 115-135 9 pm: 130-150 - ISF:   12 am: 45  5:30: 40  9 pm: 45 - Insulin on Board: 4h - bolus wizard: on  TDD from basal insulin: 51% >>  77% (30 units) >> 72% (29 units) TDD from bolus insulin: 49% >> 23% (9 units) >> 28% (11 units) TDD 40 units - extended bolusing: not using - changes infusion site: q 4 days, however, she only changes the insulin at approximately 7 days  Pt checks her sugars 1.5 times a day and they are:   CGM parameters:  - Average from CGM: 162+/-61 >> 190+/-70 >> 174+/-72 >> 149+/-42 - Average from manual BG checks: 210+/-87 >> 276+/-106 >> 197+/-86 >> 202+/-106  Time in range:  - very low (40-50): 0% >> 0% >> 1% >> 0% - low (50-70): 4% >> 1% >> 2% >> 3% - normal range (70-180): 63% >> 49% >> 51% >> 74% - high sugars (180-250): 23% >> 30% >> 32% >> 22% - very high sugars (250-400): 10% >> 20% >> 14% >> 1%  - in auto mode: 13% >> 25% >> 0% >> 5% - in manual mode: 87% >> 75% >> 100% >> 95%  Lowest sugar: 55 at night >> 40s >> 40s >> LO. She has hypoglycemia awareness at 78. + prev. hypoglycemia visit to ED -but years ago.  She has intranasal glucagon at home. She used it once since last OV. Highest  sugar: 377 >> >400 >> low 300s >> >400.  No previous DKA admissions.  -No CKD, last BUN/creatinine:  Lab Results  Component Value Date   BUN 7 08/24/2017   BUN 9 09/07/2016   CREATININE 0.84 08/24/2017   CREATININE 0.98 09/07/2016  Not on an ACE inhibitor/ARB. -+ HL; last set of lipids: Lab Results  Component Value Date   CHOL 210 (H) 08/24/2017   HDL 52.20 08/24/2017   LDLCALC 128 (H) 08/24/2017   TRIG 152.0 (H) 08/24/2017   CHOLHDL 4 08/24/2017  08/21/2015: 145/148/45/76 She is not on a statin. - last eye exam was in 07/2017: No DR - + numbness and tingling in her feet.  Off Neurontin.  Hypothyroidism:  Pt is on levothyroxine 175 mcg daily (increased after last check), taken: - in am - fasting - at least 30 min from b'fast - no Ca, Fe, MVI, PPIs - not on Biotin  Latest TSH was slightly high: Lab Results  Component Value Date   TSH 5.29 (H) 03/14/2018   TSH 4.16 10/11/2017    TSH 21.48 (H) 08/24/2017   TSH 0.813 04/09/2017   TSH 0.20 (L) 12/22/2016   TSH 44.95 Repeated and verified X2. (H) 09/17/2016   TSH 33.00 (H) 09/07/2016  11/21/2015: TSH 6.397 08/21/2015: TSH 41.268 04/24/2015: TSH 14.788 08/16/2014: TSH 0.407  Pt denies: - feeling nodules in neck - hoarseness - dysphagia - choking - SOB with lying down  Patient started college in August 2018.  ROS: Constitutional: no weight gain/+ weight loss, no fatigue, no subjective hyperthermia, no subjective hypothermia Eyes: no blurry vision, no xerophthalmia ENT: no sore throat, + see HPI Cardiovascular: no CP/no SOB/no palpitations/no leg swelling Respiratory: no cough/no SOB/no wheezing Gastrointestinal: no N/no V/no D/no C/no acid reflux Musculoskeletal: no muscle aches/no joint aches Skin: no rashes, no hair loss Neurological: no tremors/no numbness/no tingling/no dizziness  I reviewed pt's medications, allergies, PMH, social hx, family hx, and changes were documented in the history of present illness. Otherwise, unchanged from my initial visit note.  Past Medical History:  Diagnosis Date  . Diabetes mellitus without complication ([place])   . Thyroid disease     Social History   Social History  . Marital status: Single    Spouse name: N/A  . Number of children: 0   Occupational History  . student   Social History Main Topics  . Smoking status: Never Smoker  . Smokeless tobacco: Never Used  . Alcohol use No  . Drug use: No   Current Outpatient Medications on File Prior to Visit  Medication Sig Dispense Refill  . azelastine (ASTELIN) 0.1 % nasal spray     . FIASP 100 UNIT/ML SOLN USE UP TO 70 UNITS PER DAY IN PUMP 120 mL 11  . Glucagon (BAQSIMI ONE PACK) 3 MG/DOSE POWD Place 3 mg into the nose once as needed for up to 1 dose. Please replace with Glucagon im 1 kit if the intranasal form not covered 1 each 11  . glucose blood (CONTOUR NEXT TEST) test strip Use as instructed to check  sugar 4 times daily 400 each 5  . insulin lispro (HUMALOG) 100 UNIT/ML injection Use up to 90 units a day in the pump 30 mL 1  . ipratropium (ATROVENT) 0.03 % nasal spray Place 2 sprays into both nostrils every 12 (twelve) hours. 30 mL 12  . levothyroxine (SYNTHROID, LEVOTHROID) 175 MCG tablet Take 1 tablet (175 mcg total) by mouth daily before breakfast. 45 tablet 3  .  TRI FEMYNOR 0.18/0.215/0.25 MG-35 MCG tablet Take 1 tablet by mouth daily.  99  . fluticasone (FLONASE) 50 MCG/ACT nasal spray Place 2 sprays into both nostrils daily for 10 days. 16 g 0   No current facility-administered medications on file prior to visit.    No Known Allergies Family History  Problem Relation Age of Onset  . Allergies Father    PE: BP 108/70   Pulse 85   Temp 99 F (37.2 C)   Ht '5\' 3"'  (1.6 m)   Wt 159 lb (72.1 kg)   SpO2 99%   BMI 28.17 kg/m  Wt Readings from Last 3 Encounters:  07/19/18 159 lb (72.1 kg)  05/11/18 164 lb 3.2 oz (74.5 kg) (89 %, Z= 1.23)*  04/15/18 166 lb (75.3 kg) (90 %, Z= 1.28)*   * Growth percentiles are based on CDC (Girls, 2-20 Years) data.   Constitutional: overweight, in NAD Eyes: PERRLA, EOMI, no exophthalmos ENT: moist mucous membranes, no thyromegaly, no cervical lymphadenopathy Cardiovascular: RRR, No MRG Respiratory: CTA B Gastrointestinal: abdomen soft, NT, ND, BS+ Musculoskeletal: no deformities, strength intact in all 4 Skin: moist, warm, no rashes Neurological: no tremor with outstretched hands, DTR normal in all 4  ASSESSMENT: 1. DM1, uncontrolled, with complications - PN  2. Hypothyroidism  PLAN:  1. Patient with longstanding, uncontrolled, type 1 diabetes, on insulin pump therapy she is usually in college in [place] (approximately 30 minutes away from here) but now at home due to the coronavirus pandemic.  She is usually eating a low-carb diet and we discussed at last visit to enter approximately 50% of the protein amount as carbs.  At last visit,  we decreased her CBG targets.  At that time, she was not changing her insulin in the reservoir frequently, only approximately once a week and I advised her to do so frequently.  She was also not bolusing consistently, and even having days in which she did not bolus at all.  At that time, it was difficult for me to evaluate her insulin to carb ratios since she was not bolusing correctly with meals. -At this visit, reviewing the pump downloads, she is still 95% in the manual mode.  We discussed about the importance of keeping the sensor on and staying in the auto mode as much as possible  -Per review of her CGM data, she stays in the normal range 74%, but she did not use the sensor much in the last 2 weeks and she has hyperglycemia sporadically, as checked per fingersticks.  I believe that this is due to not entering enough carbs into the pump. -She is entering a very low amount of carbs per day so subsequently she is bolusing very low amounts of insulin.  Upon questioning, she has been afraid to bolus based on what the bolus wizard is telling her due to the significant drops that she developed ever since we changed her settings in 04/2018.  At that time, I advised her to increase the insulin to carb ratio from 1:6 to 1:7, so I was surprised to find out that she was dropping her sugars even more after this.  However, reviewing her pump, I noticed that she did not enter the change correctly, instead of entering 7 in the space for the ICR, she entered 1.7!  Therefore, she was getting almost 4 times for insulin than recommended.  At this visit, we discussed how to enter the insulin to carb ratio correctly and I supervised her entering the  correct amount into the pump.  We will go back to the previous ICR of 6.  I advised her from now on to try to introduce the correct amount of carbs into the pump and let me know if the sugars do not improve or if she continues to drop her sugars. -She did not decrease the CBG  targets from last visit since she had so many lows and we can do this now. -I do not feel that she needs other changes in her pump settings for now. -I advised her to:  Patient Instructions  Please continue - basal rates: 12 am: 1.4 units/h 3 am: 1.3 6 am: 1.3 11 am: 1.1 5 pm: 1.15 9 pm: 1.25 - ICR:   12 am: 1.7 >> 6  5:30 am: 1.7 >> 6  10 am: 1.7 >> 6  3 pm: 1.7 >> 6 - target:  12 am: 130-150 >> 130-130 5:30 am: 115-135 >> 115-115  9 pm: 130-150 >> 130-130 - ISF:   12 am: 45  5:30: 40  9 pm: 45 - Insulin on Board: 4h - bolus wizard: on  Please do the following approximately 15 minutes before every meal: - Enter carbs (C) - Enter sugars (S) - Start insulin bolus (I)  If you have a low carb, high protein meal, may need to enter 50% protein quantity as carbs.  - today, HbA1c is 8% (stable) - continue checking sugars at different times of the day - check 4x a day, rotating checks - advised for yearly eye exams >> she is UTD - We will check her annual labs today - Return to clinic in 3 mo with sugar log     2. Hypothyroidism -Patient with poorly controlled, longstanding, hypothyroidism, on levothyroxine therapy.  She had very fluctuating TFTs in the past, likely secondary to lack of compliance with her levothyroxine doses. - latest thyroid labs reviewed with pt >> normal in 09/2017, but TSH slightly elevated, at 5.29, in 03/2018. - We increased her levothyroxine dose to 175 mcg daily - pt feels good on this dose. - we discussed about taking the thyroid hormone every day, with water, >30 minutes before breakfast, separated by >4 hours from acid reflux medications, calcium, iron, multivitamins. Pt. is taking it correctly. - will check thyroid tests today: TSH and fT4 - If labs are abnormal, she will need to return for repeat TFTs in 1.5 months  - time spent with the patient: 40 min, of which >50% was spent in reviewing her pump and CGM downloads, discussing her hypo- and  hyper-glycemic episodes, reviewing previous labs and pump settings and developing a plan to avoid hypo- and hyper-glycemia.   Component     Latest Ref Rng & Units 07/19/2018  Glucose     65 - 99 mg/dL 133 (H)  BUN     7 - 25 mg/dL 7  Creatinine     0.50 - 1.10 mg/dL 0.83  GFR, Est Non African American     > OR = 60 mL/min/1.19m 102  GFR, Est African American     > OR = 60 mL/min/1.781m118  BUN/Creatinine Ratio     6 - 22 (calc) NOT APPLICABLE  Sodium     13116 146 mmol/L 137  Potassium     3.5 - 5.3 mmol/L 3.9  Chloride     98 - 110 mmol/L 102  CO2     20 - 32 mmol/L 28  Calcium     8.6 -  10.2 mg/dL 9.8  Total Protein     6.1 - 8.1 g/dL 7.4  Albumin MSPROF     3.6 - 5.1 g/dL 4.4  Globulin     1.9 - 3.7 g/dL (calc) 3.0  AG Ratio     1.0 - 2.5 (calc) 1.5  Total Bilirubin     0.2 - 1.2 mg/dL 0.6  Alkaline phosphatase (APISO)     31 - 125 U/L 71  AST     10 - 30 U/L 14  ALT     6 - 29 U/L 9  Cholesterol     0 - 200 mg/dL 149  Triglycerides     0.0 - 149.0 mg/dL 159.0 (H)  HDL Cholesterol     >39.00 mg/dL 45.70  VLDL     0.0 - 40.0 mg/dL 31.8  LDL (calc)     0 - 99 mg/dL 72  Total CHOL/HDL Ratio      3  NonHDL      103.57  Microalb, Ur     0.0 - 1.9 mg/dL 2.3 (H)  Creatinine,U     mg/dL 270.5  MICROALB/CREAT RATIO     0.0 - 30.0 mg/g 0.9  Hemoglobin A1C     4.0 - 5.6 % 8.0 (A)  TSH     0.35 - 5.50 uIU/mL 0.47  T4,Free(Direct)     0.60 - 1.60 ng/dL 1.38   Labs at goal with exception of higher HbA1c, triglycerides, and glucose.  Philemon Kingdom, MD PhD [facility] Endocrinology

## 2018-07-20 LAB — LIPID PANEL
Cholesterol: 149 mg/dL (ref 0–200)
HDL: 45.7 mg/dL (ref >39.00)
LDL Cholesterol: 72 mg/dL (ref 0–99)
NonHDL: 103.57
Total CHOL/HDL Ratio: 3
Triglycerides: 159 mg/dL — ABNORMAL HIGH (ref 0.0–149.0)
VLDL: 31.8 mg/dL (ref 0.0–40.0)

## 2018-07-20 LAB — MICROALBUMIN / CREATININE URINE RATIO
Creatinine,U: 270.5 mg/dL
Microalb Creat Ratio: 0.9 mg/g (ref 0.0–30.0)
Microalb, Ur: 2.3 mg/dL — ABNORMAL HIGH (ref 0.0–1.9)

## 2018-07-20 LAB — T4, FREE: Free T4: 1.38 ng/dL (ref 0.60–1.60)

## 2018-07-20 LAB — TSH: TSH: 0.47 u[IU]/mL (ref 0.35–5.50)

## 2018-08-05 DIAGNOSIS — E1042 Type 1 diabetes mellitus with diabetic polyneuropathy: Secondary | ICD-10-CM | POA: Diagnosis not present

## 2018-08-05 DIAGNOSIS — E1065 Type 1 diabetes mellitus with hyperglycemia: Secondary | ICD-10-CM | POA: Diagnosis not present

## 2018-08-15 MED FILL — TRI FEMYNOR 28 TABLET: 0.18/0.215/ | 28 days supply | Qty: 28 | Fill #0

## 2018-08-15 MED FILL — FIASP 100 UNIT/ML SOLN: 100 | 28 days supply | Qty: 20 | Fill #0

## 2018-08-15 MED FILL — LEVOTHYROXINE 175 MCG TABLE: 175 | 45 days supply | Qty: 45 | Fill #0

## 2018-08-26 ENCOUNTER — Telehealth: Payer: Self-pay | Admitting: Family Medicine

## 2018-08-26 ENCOUNTER — Other Ambulatory Visit: Payer: Self-pay | Admitting: Internal Medicine

## 2018-08-26 ENCOUNTER — Telehealth: Payer: Self-pay | Admitting: *Deleted

## 2018-08-26 ENCOUNTER — Other Ambulatory Visit: Payer: Self-pay

## 2018-08-26 DIAGNOSIS — Z20822 Contact with and (suspected) exposure to covid-19: Secondary | ICD-10-CM

## 2018-08-26 DIAGNOSIS — R6889 Other general symptoms and signs: Secondary | ICD-10-CM | POA: Diagnosis not present

## 2018-08-26 NOTE — Telephone Encounter (Signed)
Phone call to patient.  She states she was already tested earlier today.

## 2018-08-26 NOTE — Telephone Encounter (Signed)
-----   Message from Marian Sorrow, LPN sent at 2/40/9735 12:03 PM EDT ----- Regarding: Covid testing Dr. Juleen China would like pt tested.  Name: Melissa Reese DOB: [birth year] MRN:   [id number] Reason: exposure to Covid and having nausea & vomiting Insurance: [name] ID: [id number]

## 2018-08-26 NOTE — Telephone Encounter (Signed)
LDM for patient to call office back.  Patient's information sent to [facility] for testing.

## 2018-08-26 NOTE — Telephone Encounter (Signed)
Pt's father called in to request covid testing for pt. Pt's mother Cecille Rubin has tested positive for Covid.  Pt has been experiencing an upset stomach and nausea.    Please assist pt further  CB: [phone]

## 2018-09-01 LAB — NOVEL CORONAVIRUS, NAA: SARS-CoV-2, NAA: NOT DETECTED

## 2018-09-06 ENCOUNTER — Other Ambulatory Visit: Payer: Self-pay

## 2018-09-06 ENCOUNTER — Other Ambulatory Visit: Payer: Self-pay | Admitting: Internal Medicine

## 2018-09-06 ENCOUNTER — Ambulatory Visit (INDEPENDENT_AMBULATORY_CARE_PROVIDER_SITE_OTHER): Payer: 59 | Admitting: Family Medicine

## 2018-09-06 ENCOUNTER — Encounter: Payer: Self-pay | Admitting: Family Medicine

## 2018-09-06 VITALS — BP 110/76 | HR 85 | Temp 98.2°F | Ht 62.25 in | Wt 142.8 lb

## 2018-09-06 DIAGNOSIS — Z3041 Encounter for surveillance of contraceptive pills: Secondary | ICD-10-CM

## 2018-09-06 DIAGNOSIS — E669 Obesity, unspecified: Secondary | ICD-10-CM | POA: Diagnosis not present

## 2018-09-06 DIAGNOSIS — Z0000 Encounter for general adult medical examination without abnormal findings: Secondary | ICD-10-CM

## 2018-09-06 DIAGNOSIS — Z23 Encounter for immunization: Secondary | ICD-10-CM | POA: Diagnosis not present

## 2018-09-06 MED ORDER — TRI FEMYNOR 0.18/0.215/0.25 MG-35 MCG PO TABS: 1.0000 | ORAL_TABLET | Freq: Every day | ORAL | 99 refills | Status: DC

## 2018-09-06 MED FILL — FIASP 100 UNIT/ML SOLN: 100 | 28 days supply | Qty: 20 | Fill #1

## 2018-09-06 MED FILL — TRI FEMYNOR 28 TABLET: 0.18/0.215/ | 84 days supply | Qty: 84 | Fill #0

## 2018-09-06 NOTE — Progress Notes (Signed)
Subjective:    Melissa Reese is a 18 y.o. female and is here for a comprehensive physical exam.   Current Outpatient Medications:  .  FIASP 100 UNIT/ML SOLN, USE UP TO 70 UNITS PER DAY IN PUMP, Disp: 120 mL, Rfl: 11 .  Glucagon (BAQSIMI ONE PACK) 3 MG/DOSE POWD, Place 3 mg into the nose once as needed for up to 1 dose. Please replace with Glucagon im 1 kit if the intranasal form not covered, Disp: 1 each, Rfl: 11 .  glucose blood (CONTOUR NEXT TEST) test strip, Use as instructed to check sugar 4 times daily, Disp: 400 each, Rfl: 5 .  insulin lispro (HUMALOG) 100 UNIT/ML injection, Use up to 90 units a day in the pump, Disp: 30 mL, Rfl: 1 .  TRI FEMYNOR 0.18/0.215/0.25 MG-35 MCG tablet, Take 1 tablet by mouth daily., Disp: 1 Package, Rfl: 99 .  levothyroxine (SYNTHROID) 175 MCG tablet, TAKE 1 TABLET BY MOUTH DAILY BEFORE BREAKFAST., Disp: 45 tablet, Rfl: 0  Health Maintenance Due  Topic Date Due  . PNEUMOCOCCAL POLYSACCHARIDE VACCINE AGE 76-64 HIGH RISK  05/16/2000  . FOOT EXAM  09/17/2017  . OPHTHALMOLOGY EXAM  08/18/2018    PMHx, SurgHx, SocialHx, Medications, and Allergies were reviewed in the Visit Navigator and updated as appropriate.   Past Medical History:  Diagnosis Date  . Diabetes mellitus without complication ([place])   . Thyroid disease     History reviewed. No pertinent surgical history.   Family History  Problem Relation Age of Onset  . Allergies Father     Social History   Tobacco Use  . Smoking status: Never Smoker  . Smokeless tobacco: Never Used  Substance Use Topics  . Alcohol use: No  . Drug use: No    Review of Systems:   Pertinent items are noted in the HPI. Otherwise, ROS is negative.  Objective:   BP 110/76 (BP Location: Left Arm, Patient Position: Sitting, Cuff Size: Normal)   Pulse 85   Temp 98.2 F (36.8 C) (Oral)   Ht 5' 2.25" (1.581 m)   Wt 142 lb 12.8 oz (64.8 kg)   LMP 09/06/2018 (Exact Date)   SpO2 98%   BMI 25.91 kg/m      General appearance: alert, cooperative and appears stated age. Head: normocephalic, without obvious abnormality, atraumatic. Neck: no adenopathy, supple, symmetrical, trachea midline; thyroid not enlarged, symmetric, no tenderness/mass/nodules. Lungs: clear to auscultation bilaterally. Heart: regular rate and rhythm Abdomen: soft, non-tender; no masses,  no organomegaly. Extremities: extremities normal, atraumatic, no cyanosis or edema. Skin: skin color, texture, turgor normal, no rashes or lesions. Lymph: cervical, supraclavicular, and axillary nodes normal; no abnormal inguinal nodes palpated. Neurologic: grossly normal.  Assessment/Plan:   Melissa Reese was seen today for annual exam.  Diagnoses and all orders for this visit:  Routine physical examination  Need for HPV vaccination -     HPV 9-valent vaccine,Recombinat  Need for prophylactic vaccination with combined diphtheria-tetanus-pertussis (DTP) vaccine -     Tdap vaccine greater than or equal to 7yo IM  Encounter for surveillance of contraceptive pills -     TRI FEMYNOR 0.18/0.215/0.25 MG-35 MCG tablet; Take 1 tablet by mouth daily.  Obesity (BMI 30-39.9)    Patient Counseling:   _0     Nutrition: Stressed importance of moderation in sodium/caffeine intake, saturated fat and cholesterol, caloric balance, sufficient intake of fresh fruits, vegetables, fiber, calcium, iron, and 1 mg of folate supplement per day (for females capable of pregnancy).   [  x]     Stressed the importance of regular exercise.    _0     Substance Abuse: Discussed cessation/primary prevention of tobacco, alcohol, or other drug use; driving or other dangerous activities under the influence; availability of treatment for abuse.    _1      Injury prevention: Discussed safety belts, safety helmets, smoke detector, smoking near bedding or upholstery.    _2      Sexuality: Discussed sexually transmitted diseases, partner selection, use of condoms,  avoidance of unintended pregnancy  and contraceptive alternatives.    _3     Dental health: Discussed importance of regular tooth brushing, flossing, and dental visits.   _4      Health maintenance and immunizations reviewed. Please refer to Health maintenance section.   Briscoe Deutscher, DO [place]

## 2018-09-28 MED FILL — LEVOTHYROXINE 175 MCG TABLE: 175 | 45 days supply | Qty: 45 | Fill #0

## 2018-10-04 DIAGNOSIS — E1065 Type 1 diabetes mellitus with hyperglycemia: Secondary | ICD-10-CM | POA: Diagnosis not present

## 2018-10-04 DIAGNOSIS — E1042 Type 1 diabetes mellitus with diabetic polyneuropathy: Secondary | ICD-10-CM | POA: Diagnosis not present

## 2018-10-10 ENCOUNTER — Other Ambulatory Visit: Payer: Self-pay | Admitting: Internal Medicine

## 2018-10-10 MED FILL — FIASP 100 UNIT/ML SOLN: 100 | 28 days supply | Qty: 20 | Fill #2

## 2018-10-12 MED FILL — CONTOUR NEXT STRIPS: 90 days supply | Qty: 400 | Fill #0

## 2018-10-19 ENCOUNTER — Telehealth: Payer: Self-pay

## 2018-10-19 NOTE — Telephone Encounter (Signed)
Forms for CGM supplies filled out, signed by Dr. Cruzita Lederer and faxed to [employer] with confirmation.

## 2018-10-31 DIAGNOSIS — E1065 Type 1 diabetes mellitus with hyperglycemia: Secondary | ICD-10-CM | POA: Diagnosis not present

## 2018-10-31 DIAGNOSIS — E1042 Type 1 diabetes mellitus with diabetic polyneuropathy: Secondary | ICD-10-CM | POA: Diagnosis not present

## 2018-11-09 ENCOUNTER — Other Ambulatory Visit: Payer: Self-pay | Admitting: Internal Medicine

## 2018-11-09 MED FILL — LEVOTHYROXINE 175 MCG TABLE: 175 | 45 days supply | Qty: 45 | Fill #0

## 2018-11-09 MED FILL — FIASP 100 UNIT/ML SOLN: 100 | 28 days supply | Qty: 20 | Fill #3

## 2018-11-18 ENCOUNTER — Other Ambulatory Visit: Payer: Self-pay

## 2018-11-22 ENCOUNTER — Encounter: Payer: Self-pay | Admitting: Internal Medicine

## 2018-11-22 ENCOUNTER — Ambulatory Visit: Payer: 59 | Admitting: Internal Medicine

## 2018-11-22 ENCOUNTER — Other Ambulatory Visit: Payer: Self-pay

## 2018-11-22 ENCOUNTER — Ambulatory Visit (INDEPENDENT_AMBULATORY_CARE_PROVIDER_SITE_OTHER): Payer: 59

## 2018-11-22 VITALS — BP 110/72 | HR 88 | Ht 62.25 in | Wt 142.0 lb

## 2018-11-22 DIAGNOSIS — E1042 Type 1 diabetes mellitus with diabetic polyneuropathy: Secondary | ICD-10-CM

## 2018-11-22 DIAGNOSIS — E039 Hypothyroidism, unspecified: Secondary | ICD-10-CM

## 2018-11-22 DIAGNOSIS — Z23 Encounter for immunization: Secondary | ICD-10-CM

## 2018-11-22 LAB — POCT GLYCOSYLATED HEMOGLOBIN (HGB A1C): Hemoglobin A1C: 6.7 % — AB (ref 4.0–5.6)

## 2018-11-22 NOTE — Progress Notes (Signed)
Patient ID: Melissa Reese, female   DOB: 09/13/2000, 18 y.o.   MRN: [id number]   HPI: Melissa Reese is a 18 y.o.-year-old female, initially referred by her PCP, Dr. Juleen China, returning for follow-up for DM1  Dx 2003, uncontrolled, with complications (PN). Prev. seen at [facility] clinic. Last visit with me 4 months ago.  At last visit, she returned after she contacted me in 04/2018 for low blood sugars during an upper respiratory infection and not eating well.  At that time, I advised her to increase her insulin to carb ratio from 1:6 to 1:7.  However, by mistake, she introduced 1:1.7 ICRC in the pump!  She is on an insulin pump: - Started at 18 years old - OneTouch ping - started 01/2015 -Currently on the Medtronic 670 G insulin pump + guardian CGM-started 09/2016 (band aid to keep the CGM in place: Simplatch). She had a crack in her pump - new pump 01/2018.  She was off the CGM before and over the holidays, now back on the CGM starting 03/2018.  She did not use this consistently since last visit: Being mostly in the manual mode.  She uses Humalog in the pump.  DM1:  Latest HbA1c levels: Lab Results  Component Value Date   HGBA1C 8.0 (A) 07/19/2018   HGBA1C 8.0 (A) 03/14/2018   HGBA1C 8.7 (H) 08/24/2017   HGBA1C 8.9 04/09/2017   HGBA1C 8.4 12/22/2016   HGBA1C 9.7 (H) 09/07/2016  Prev.  02/20/2016: HbA1c 7.2% 11/21/2015: HbA1c 7.4% 08/21/2015: HbA1c 7.7% 04/24/2015: HbA1c 7.8%  Pump settings: - basal rates: 12 am: 1.4 units/h 3 am: 1.3 6 am: 1.3 11 am: 1.1 5 pm: 1.15 9 pm: 1.25 - ICR:   12 am: 1.7 >> 6 >> 7 - target:  12 am: 130-150 >> 130-130 5:30 am: 115-135 >> 115-115  9 pm: 130-150 >> 130-130 - ISF:   12 am: 45  5:30: 40  9 pm: 45 - Insulin on Board: 4h - bolus wizard: on  TDD from basal insulin: 51% >> 77% (30 units) >> 72% (29 units) >> 67% (24 units) TDD from bolus insulin: 49% >> 23% (9 units) >> 28% (11 units) >> 33% (12 units) TDD 40 units -  extended bolusing: not using - changes infusion site: Every 4.3 days  She checks her sugars more than 4 times a day.  CGM parameters:  - Average from CGM:  174+/-72 >> 149+/-42 >> 150+/-44 - Average from manual BG checks: 197+/-86 >> 202+/-106 >> 171+/-53  Time in range:  - very low (40-50): 0% >> 0% >> 1% >> 0% >> 0% - low (50-70): 4% >> 1% >> 2% >> 3% >> 0% - normal range (70-180): 63% >> 49% >> 51% >> 74% >> 77% - high sugars (180-250): 23% >> 30% >> 32% >> 22% >> 19% - very high sugars (250-400): 10% >> 20% >> 14% >> 1% >> 3%  - in auto mode: 13% >> 25% >> 0% >> 5% >> 74% of the time - in manual mode: 87% >> 75% >> 100% >> 95% >> 26% of the time    Lowest sugar: 40s >> LO >> 60. She has hypoglycemia awareness at 80.  She had previous ED visits for hypoglycemia years ago.  She has intranasal glucagon at home. Highest sugar: >400 >> 300.  No previous DKA admissions.  -No CKD, last BUN/creatinine:  Lab Results  Component Value Date   BUN 7 07/19/2018   BUN 7 08/24/2017  CREATININE 0.83 07/19/2018   CREATININE 0.84 08/24/2017  She is not on an ACE inhibitor/ARB. -+HL; last set of lipids: Lab Results  Component Value Date   CHOL 149 07/19/2018   HDL 45.70 07/19/2018   LDLCALC 72 07/19/2018   TRIG 159.0 (H) 07/19/2018   CHOLHDL 3 07/19/2018  08/21/2015: 145/148/45/76 She is not on a statin. - latest eye exam:  07/2017: No DR. - + numbness and tingling in her feet.  Off Neurontin.  Hypothyroidism:  Pt is on levothyroxine 175 mcg daily, taken: - in am - fasting - at least 30 min from b'fast - no Ca, Fe, MVI, PPIs - not on Biotin  Reviewed her TFTs: Lab Results  Component Value Date   TSH 0.47 07/19/2018   TSH 5.29 (H) 03/14/2018   TSH 4.16 10/11/2017   TSH 21.48 (H) 08/24/2017   TSH 0.813 04/09/2017   TSH 0.20 (L) 12/22/2016   TSH 44.95 Repeated and verified X2. (H) 09/17/2016   TSH 33.00 (H) 09/07/2016  11/21/2015: TSH 6.397 08/21/2015: TSH 41.268  04/24/2015: TSH 14.788 08/16/2014: TSH 0.407  Pt denies: - feeling nodules in neck - hoarseness - dysphagia - choking - SOB with lying down  She started college in August 2018.  ROS: Constitutional: no weight gain/+ weight loss, no fatigue, no subjective hyperthermia, no subjective hypothermia Eyes: no blurry vision, no xerophthalmia ENT: no sore throat, + see HPI Cardiovascular: no CP/no SOB/no palpitations/no leg swelling Respiratory: no cough/no SOB/no wheezing Gastrointestinal: no N/no V/no D/no C/no acid reflux Musculoskeletal: no muscle aches/no joint aches Skin: no rashes, no hair loss Neurological: no tremors/no numbness/no tingling/no dizziness  I reviewed pt's medications, allergies, PMH, social hx, family hx, and changes were documented in the history of present illness. Otherwise, unchanged from my initial visit note.  Past Medical History:  Diagnosis Date  . Diabetes mellitus without complication ([place])   . Thyroid disease     Social History   Social History  . Marital status: Single    Spouse name: N/A  . Number of children: 0   Occupational History  . student   Social History Main Topics  . Smoking status: Never Smoker  . Smokeless tobacco: Never Used  . Alcohol use No  . Drug use: No   Current Outpatient Medications on File Prior to Visit  Medication Sig Dispense Refill  . CONTOUR NEXT TEST test strip USE AS INSTRUCTED TO CHECK SUGAR 4 TIMES DAILY 400 strip 5  . FIASP 100 UNIT/ML SOLN USE UP TO 70 UNITS PER DAY IN PUMP 120 mL 11  . Glucagon (BAQSIMI ONE PACK) 3 MG/DOSE POWD Place 3 mg into the nose once as needed for up to 1 dose. Please replace with Glucagon im 1 kit if the intranasal form not covered 1 each 11  . insulin lispro (HUMALOG) 100 UNIT/ML injection Use up to 90 units a day in the pump 30 mL 1  . levothyroxine (SYNTHROID) 175 MCG tablet TAKE 1 TABLET BY MOUTH DAILY BEFORE BREAKFAST. 45 tablet 0  . TRI FEMYNOR 0.18/0.215/0.25 MG-35 MCG  tablet Take 1 tablet by mouth daily. 1 Package 99   No current facility-administered medications on file prior to visit.    No Known Allergies Family History  Problem Relation Age of Onset  . Allergies Father    PE: BP 110/72   Pulse 88   Ht 5' 2.25" (1.581 m) Comment: measured without shoes  Wt 142 lb (64.4 kg)   SpO2 97%   BMI  25.76 kg/m  Wt Readings from Last 3 Encounters:  11/22/18 142 lb (64.4 kg)  09/06/18 142 lb 12.8 oz (64.8 kg)  07/19/18 159 lb (72.1 kg)   Constitutional: overweight, in NAD Eyes: PERRLA, EOMI, no exophthalmos ENT: moist mucous membranes, no thyromegaly, no cervical lymphadenopathy Cardiovascular: RRR, No MRG Respiratory: CTA B Gastrointestinal: abdomen soft, NT, ND, BS+ Musculoskeletal: no deformities, strength intact in all 4 Skin: moist, warm, no rashes Neurological: no tremor with outstretched hands, DTR normal in all 4  ASSESSMENT: 1. DM1, uncontrolled, with complications - PN  2. Hypothyroidism  PLAN:  1. Patient with longstanding, uncontrolled, type 1 diabetes, on insulin pump therapy.  At last visit, she presented with low blood sugars after she introduced incorrectly her insulin to carb ratios in her pump.  Since then, after we changed her pump settings, her sugars improved significantly and she is also able to stay in the automatic mode a good amount of the time.  At today's visit, we reviewed together her insulin pump and CGM downloads (we will scan these) and she is staying in the normal range 77% of the time, which is excellent. -Reviewing the tracings, her sugars still appear to be above target at night and she tells me that she sometimes has to correct by introducing phantom carbs into the pump.  Therefore, we will lower  her CBGs target at night and also will decrease her insulin sensitivity factors then. -Regarding her insulin to carb ratios, I feel that these are correct for now.  He is still not bolusing 15 minutes before each meal,  but she is working towards this goal. -He is doing a better job at reducing carbs into the pump but she is not bolusing for coffee and we discussed that most people will need to introduce a certain amount of carbs in the form black coffee.  I advised her to start with 10 g.  She will try this. -There are no significant low blood sugars since last visit and it does not seem that her basal rates are excessive. -She is changing her infusion sets every approximately 4 days, which is -She has more variability in her blood sugars and craves more carbs in the week before her menstrual cycles and we discussed that this is a.  With higher insulin resistance and will need to be vigilant with her blood sugars.  Fortunately, he is using the automatic mode, which can adjust the basal rates for this. -I advised her to:  Patient Instructions  Please change: - basal rates: 12 am: 1.4 units/h 3 am: 1.3 6 am: 1.3 11 am: 1.1 5 pm: 1.15 9 pm: 1.25 - ICR:   12 am: 7 - target:  12 am: 130-130 >> 120-120 5:30 am: 115-115 9 pm: 130-130 >> 120-120 - ISF:   12 am: 45 >> 35  5:30: 40 >> 35  9 pm: 45 >> 35 - Insulin on Board: 4h - bolus wizard: on  Please bolus for coffee - start at 10 g carbs.  Please do the following approximately 15 minutes before every meal: - Enter carbs (C) - Enter sugars (S) - Start insulin bolus (I)  - we checked her HbA1c: 6.9% (best in the long while) - advised to check sugars at different times of the day - 4x a day, rotating check times - advised for yearly eye exams >> she is UTD - return to clinic in 3-4 months    2. Hypothyroidism -Patient with history of poorly controlled,  longstanding, hypothyroidism.  She is on levothyroxine therapy.  She has very fluctuating TFTs in the past, likely secondary to lack of compliance with her levothyroxine doses. - latest thyroid labs reviewed with pt >> normal 07/2018 - she continues on LT4 175 mcg daily - pt feels good on this dose.  - we discussed about taking the thyroid hormone every day, with water, >30 minutes before breakfast, separated by >4 hours from acid reflux medications, calcium, iron, multivitamins. Pt. is taking it correctly. - At this visit, we will recheck the TFTs due to her significant weight loss  - time spent with the patient: 30 min, of which >50% was spent in reviewing her pump and CGM downloads, discussing her hypo- and hyper-glycemic episodes, reviewing previous labs and pump settings and developing a plan to avoid hypo- and hyper-glycemia.  We also addressed her hypothyroidism.  Component     Latest Ref Rng & Units 11/22/2018  Hemoglobin A1C     4.0 - 5.6 % 6.7 (A)  TSH     0.35 - 5.50 uIU/mL 0.01 (L)  T4,Free(Direct)     0.60 - 1.60 ng/dL 1.43  TSH suppressed.  We will decrease the dose of levothyroxine to 150 mcg daily and recheck in 1.5 months.  Philemon Kingdom, MD PhD [facility] Endocrinology

## 2018-11-22 NOTE — Patient Instructions (Signed)
Health Maintenance Due  Topic Date Due  . PNEUMOCOCCAL POLYSACCHARIDE VACCINE AGE 20-64 HIGH RISK  05/16/2000  . FOOT EXAM  09/17/2017  . OPHTHALMOLOGY EXAM  08/18/2018  . INFLUENZA VACCINE  10/01/2018    Depression screen PHQ 2/9 09/03/2016  Decreased Interest 1  Down, Depressed, Hopeless 0  PHQ - 2 Score 1

## 2018-11-22 NOTE — Progress Notes (Signed)
Per orders of Dr. Juleen China, injection of Gardasil 9 given by Brendell L Tyus in left deltoid. Patient tolerated injection well.

## 2018-11-22 NOTE — Patient Instructions (Signed)
Please change: - basal rates: 12 am: 1.4 units/h 3 am: 1.3 6 am: 1.3 11 am: 1.1 5 pm: 1.15 9 pm: 1.25 - ICR:   12 am: 7 - target:  12 am: 130-130 >> 120-120 5:30 am: 115-115 9 pm: 130-130 >> 120-120 - ISF:   12 am: 45 >> 35  5:30: 40 >> 35  9 pm: 45 >> 35 - Insulin on Board: 4h - bolus wizard: on  Please bolus for coffee - start at 10 g carbs.  Please do the following approximately 15 minutes before every meal: - Enter carbs (C) - Enter sugars (S) - Start insulin bolus (I)

## 2018-11-23 LAB — TSH: TSH: 0.01 u[IU]/mL — ABNORMAL LOW (ref 0.35–5.50)

## 2018-11-23 LAB — T4, FREE: Free T4: 1.43 ng/dL (ref 0.60–1.60)

## 2018-11-23 MED FILL — TRI FEMYNOR 28 TABLET: 0.18/0.215/ | 84 days supply | Qty: 84 | Fill #1

## 2018-11-24 MED ORDER — LEVOTHYROXINE SODIUM 150 MCG PO TABS: 150.0000 ug | ORAL_TABLET | Freq: Every day | ORAL | 3 refills | Status: DC

## 2018-11-24 MED FILL — LEVOTHYROXINE 150 MCG TAB: 150 | 30 days supply | Qty: 30 | Fill #0

## 2018-12-12 MED FILL — FIASP 100 UNIT/ML SOLN: 100 | 28 days supply | Qty: 20 | Fill #4

## 2018-12-26 DIAGNOSIS — E1042 Type 1 diabetes mellitus with diabetic polyneuropathy: Secondary | ICD-10-CM | POA: Diagnosis not present

## 2018-12-26 DIAGNOSIS — E1065 Type 1 diabetes mellitus with hyperglycemia: Secondary | ICD-10-CM | POA: Diagnosis not present

## 2018-12-29 MED FILL — LEVOTHYROXINE 150 MCG TAB: 150 | 30 days supply | Qty: 30 | Fill #1

## 2019-01-02 ENCOUNTER — Other Ambulatory Visit: Payer: 59

## 2019-01-03 ENCOUNTER — Other Ambulatory Visit: Payer: Self-pay

## 2019-01-03 ENCOUNTER — Other Ambulatory Visit (INDEPENDENT_AMBULATORY_CARE_PROVIDER_SITE_OTHER): Payer: 59

## 2019-01-03 DIAGNOSIS — E039 Hypothyroidism, unspecified: Secondary | ICD-10-CM | POA: Diagnosis not present

## 2019-01-03 LAB — T4, FREE: Free T4: 1.56 ng/dL (ref 0.60–1.60)

## 2019-01-03 LAB — TSH: TSH: 0.02 u[IU]/mL — ABNORMAL LOW (ref 0.35–5.50)

## 2019-01-04 ENCOUNTER — Other Ambulatory Visit: Payer: Self-pay | Admitting: Internal Medicine

## 2019-01-04 MED ORDER — LEVOTHYROXINE SODIUM 125 MCG PO TABS: 125.0000 ug | ORAL_TABLET | Freq: Every day | ORAL | 3 refills | Status: DC

## 2019-01-04 MED FILL — LEVOTHYROXINE 125 MCG TAB: 125 | 90 days supply | Qty: 90 | Fill #0

## 2019-01-05 ENCOUNTER — Telehealth: Payer: Self-pay

## 2019-01-05 NOTE — Telephone Encounter (Signed)
Forms for CMN DM supplies filled out, signed by Dr. Cruzita Lederer and faxed to [employer] with confirmation.

## 2019-01-11 MED FILL — FIASP 100 UNIT/ML SOLN: 100 | 28 days supply | Qty: 20 | Fill #5

## 2019-01-25 DIAGNOSIS — E1065 Type 1 diabetes mellitus with hyperglycemia: Secondary | ICD-10-CM | POA: Diagnosis not present

## 2019-01-25 DIAGNOSIS — E1042 Type 1 diabetes mellitus with diabetic polyneuropathy: Secondary | ICD-10-CM | POA: Diagnosis not present

## 2019-01-30 MED FILL — TRI FEMYNOR 28 TABLET: 0.18/0.215/ | 84 days supply | Qty: 84 | Fill #2

## 2019-02-02 MED FILL — FIASP 100 UNIT/ML SOLN: 100 | 28 days supply | Qty: 20 | Fill #6

## 2019-02-09 ENCOUNTER — Telehealth: Payer: Self-pay | Admitting: Internal Medicine

## 2019-02-09 NOTE — Telephone Encounter (Signed)
[employer] called to advise that they faxed to [phone] a CMN for a new pump on 02/07/2019

## 2019-02-09 NOTE — Telephone Encounter (Signed)
I have faxed it back twice but as always, it takes faxing it several times before it will go through. I will continue to keep faxing and eventually it will go through.

## 2019-02-10 DIAGNOSIS — E1065 Type 1 diabetes mellitus with hyperglycemia: Secondary | ICD-10-CM | POA: Diagnosis not present

## 2019-02-10 DIAGNOSIS — E1042 Type 1 diabetes mellitus with diabetic polyneuropathy: Secondary | ICD-10-CM | POA: Diagnosis not present

## 2019-02-13 ENCOUNTER — Encounter: Payer: Self-pay | Admitting: Internal Medicine

## 2019-02-14 MED ORDER — ACCU-CHEK GUIDE VI STRP: ORAL_STRIP | 12 refills | Status: DC

## 2019-02-14 MED FILL — ACCU-CHEK GUIDE STRP: 90 days supply | Qty: 400 | Fill #0

## 2019-03-01 DIAGNOSIS — H5213 Myopia, bilateral: Secondary | ICD-10-CM | POA: Diagnosis not present

## 2019-03-01 MED FILL — FIASP 100 UNIT/ML SOLN: 100 | 28 days supply | Qty: 20 | Fill #7

## 2019-03-06 ENCOUNTER — Encounter: Payer: Self-pay | Admitting: Internal Medicine

## 2019-03-07 ENCOUNTER — Encounter: Payer: Self-pay | Admitting: Internal Medicine

## 2019-03-08 ENCOUNTER — Ambulatory Visit: Payer: 59 | Attending: Internal Medicine

## 2019-03-08 ENCOUNTER — Other Ambulatory Visit: Payer: Self-pay

## 2019-03-08 DIAGNOSIS — Z20822 Contact with and (suspected) exposure to covid-19: Secondary | ICD-10-CM | POA: Diagnosis not present

## 2019-03-09 ENCOUNTER — Encounter: Payer: Self-pay | Admitting: Internal Medicine

## 2019-03-10 LAB — NOVEL CORONAVIRUS, NAA: SARS-CoV-2, NAA: NOT DETECTED

## 2019-03-17 DIAGNOSIS — E1042 Type 1 diabetes mellitus with diabetic polyneuropathy: Secondary | ICD-10-CM | POA: Diagnosis not present

## 2019-03-17 DIAGNOSIS — E1065 Type 1 diabetes mellitus with hyperglycemia: Secondary | ICD-10-CM | POA: Diagnosis not present

## 2019-03-23 ENCOUNTER — Encounter: Payer: Self-pay | Admitting: Internal Medicine

## 2019-03-24 ENCOUNTER — Other Ambulatory Visit: Payer: Self-pay

## 2019-03-24 ENCOUNTER — Encounter: Payer: Self-pay | Admitting: Internal Medicine

## 2019-03-24 ENCOUNTER — Ambulatory Visit (INDEPENDENT_AMBULATORY_CARE_PROVIDER_SITE_OTHER): Payer: 59 | Admitting: Internal Medicine

## 2019-03-24 VITALS — BP 110/80 | HR 70 | Ht 62.25 in | Wt 145.0 lb

## 2019-03-24 DIAGNOSIS — E039 Hypothyroidism, unspecified: Secondary | ICD-10-CM | POA: Diagnosis not present

## 2019-03-24 DIAGNOSIS — E1065 Type 1 diabetes mellitus with hyperglycemia: Secondary | ICD-10-CM

## 2019-03-24 DIAGNOSIS — IMO0002 Reserved for concepts with insufficient information to code with codable children: Secondary | ICD-10-CM

## 2019-03-24 DIAGNOSIS — E1040 Type 1 diabetes mellitus with diabetic neuropathy, unspecified: Secondary | ICD-10-CM

## 2019-03-24 LAB — POCT GLYCOSYLATED HEMOGLOBIN (HGB A1C): Hemoglobin A1C: 6.9 % — AB (ref 4.0–5.6)

## 2019-03-24 NOTE — Progress Notes (Signed)
Patient ID: Melissa Reese, female   DOB: [birth year], 19 y.o.   MRN: [id number]   This visit occurred during the SARS-CoV-2 public health emergency.  Safety protocols were in place, including screening questions prior to the visit, additional usage of staff PPE, and extensive cleaning of exam room while observing appropriate contact time as indicated for disinfecting solutions.   HPI: Melissa Reese is a 19 y.o.-year-old female, initially referred by her PCP, Dr. Juleen China, returning for follow-up for DM1  Dx 2003, uncontrolled, with complications (PN). Prev. seen at [facility] clinic. Last visit with me 4 months ago.  Since last visit, she had a low blood sugar at 48 in the morning on 03/07/2019.  She continues to have sugars in the 40s, usually after corrections of high blood sugars.  She is on insulin pump - Started at 19 years old - OneTouch ping - started 01/2015 - Medtronic 670 G insulin pump + guardian CGM-started 09/2016 (band aid to keep the CGM in place: Simplatch). She had a crack in her pump -received another pump 01/2018.  She was off the CGM, then back on the CGM starting 03/2018.   - Medtronic 770 G -started 01/2019.  Meter: Accu-Chek guide link She uses FiAsp in the pump.  DM1:  Reviewed HbA1c levels: Lab Results  Component Value Date   HGBA1C 6.7 (A) 11/22/2018   HGBA1C 8.0 (A) 07/19/2018   HGBA1C 8.0 (A) 03/14/2018   HGBA1C 8.7 (H) 08/24/2017   HGBA1C 8.9 04/09/2017   HGBA1C 8.4 12/22/2016   HGBA1C 9.7 (H) 09/07/2016  Prev.  02/20/2016: HbA1c 7.2% 11/21/2015: HbA1c 7.4% 08/21/2015: HbA1c 7.7% 04/24/2015: HbA1c 7.8%  Pump settings: - basal rates: 12 am: 1.4 units/h 3 am: 1.3 6 am: 1.3 11 am: 1.1 5 pm: 1.15 9 pm: 1.25 - ICR:   12 am: 7 - target:  12 am: 130-130 >> 120-120 5:30 am: 115-115 9 pm: 130-130 >> 120-120 - ISF:   12 am: 45 >> 35  5:30: 40 >> 35  9 pm: 45 >> 35 - Insulin on Board: 4h  TDD from basal insulin: 72% (29 units) >> 67% (24  units) >> 70% (23 units) TDD from bolus insulin: 28% (11 units) >> 33% (12 units) >> 30% (10 units) TDD 40 units - extended bolusing: not using - changes infusion site: Every every 4 to 5 days  She checks her sugars more than 4 times a day with her CGM.  CGM parameters:  - Average from CGM:  174+/-72 >> 149+/-42 >> 150+/-44 >> 161+/-57 - Average from manual BG checks: 197+/-86 >> 202+/-106 >> 120 +/-53  Time in range:  - very low (40-50): 0% >> 0% >> 1% >> 0% >> 0% >> 1% - low (50-70): 4% >> 1% >> 2% >> 3% >> 0% >> 1% - normal range (70-180): 63% >> 49% >> 51% >> 74% >> 77% >> 63% - high sugars (180-250): 23% >> 30% >> 32% >> 22% >> 19% >> 26% - very high sugars (250-400): 10% >> 20% >> 14% >> 1% >> 3% >> 7%  - in auto mode: 13% >> 25% >> 0% >> 5% >> 74 % of the time - in manual mode: 87% >> 75% >> 100% >> 95% >> 26 % of the time   Previously:   Lowest sugar: 40s >> LO >> 60 >> 48. She has hypoglycemia awareness at 80.  She had previous ED visits for hypoglycemia years ago.  She has an intranasal glucagon  kit at home Highest sugar: >400 >> 300 >> 300.  No previous DKA admissions.  -No CKD, last BUN/creatinine:  Lab Results  Component Value Date   BUN 7 07/19/2018   BUN 7 08/24/2017   CREATININE 0.83 07/19/2018   CREATININE 0.84 08/24/2017  She is not on ACE inhibitor/ARB. -+ HL; last set of lipids: Lab Results  Component Value Date   CHOL 149 07/19/2018   HDL 45.70 07/19/2018   LDLCALC 72 07/19/2018   TRIG 159.0 (H) 07/19/2018   CHOLHDL 3 07/19/2018  08/21/2015: 145/148/45/76 She is not on a statin. - latest eye exam: 03/2019: No DR; eyesight improved - + numbness and tingling in her feet.  Off Neurontin  Hypothyroidism:  Pt is on levothyroxine 125 mcg daily (last dose decreased 01/2019), taken: - in am - fasting - at least 30 min from b'fast - no Ca, Fe, MVI, PPIs - not on Biotin  Reviewed her TFTs: Lab Results  Component Value Date   TSH 0.02 (L)  01/03/2019   TSH 0.01 (L) 11/22/2018   TSH 0.47 07/19/2018   TSH 5.29 (H) 03/14/2018   TSH 4.16 10/11/2017   TSH 21.48 (H) 08/24/2017   TSH 0.813 04/09/2017   TSH 0.20 (L) 12/22/2016   TSH 44.95 Repeated and verified X2. (H) 09/17/2016   TSH 33.00 (H) 09/07/2016  11/21/2015: TSH 6.397 08/21/2015: TSH 41.268 04/24/2015: TSH 14.788 08/16/2014: TSH 0.407  Pt denies: - feeling nodules in neck - hoarseness - dysphagia - choking - SOB with lying down  She started college in August 2018.  ROS: Constitutional: no weight gain/no weight loss, no fatigue, no subjective hyperthermia, no subjective hypothermia Eyes: no blurry vision, no xerophthalmia ENT: no sore throat, + see HPI Cardiovascular: no CP/no SOB/no palpitations/no leg swelling Respiratory: no cough/no SOB/no wheezing Gastrointestinal: no N/no V/no D/no C/no acid reflux Musculoskeletal: no muscle aches/no joint aches Skin: no rashes, no hair loss Neurological: no tremors/+ numbness/+ tingling/no dizziness  I reviewed pt's medications, allergies, PMH, social hx, family hx, and changes were documented in the history of present illness. Otherwise, unchanged from my initial visit note.  Past Medical History:  Diagnosis Date  . Diabetes mellitus without complication ([place])   . Thyroid disease     Social History   Social History  . Marital status: Single    Spouse name: N/A  . Number of children: 0   Occupational History  . student   Social History Main Topics  . Smoking status: Never Smoker  . Smokeless tobacco: Never Used  . Alcohol use No  . Drug use: No   Current Outpatient Medications on File Prior to Visit  Medication Sig Dispense Refill  . FIASP 100 UNIT/ML SOLN USE UP TO 70 UNITS PER DAY IN PUMP 120 mL 11  . Glucagon (BAQSIMI ONE PACK) 3 MG/DOSE POWD Place 3 mg into the nose once as needed for up to 1 dose. Please replace with Glucagon im 1 kit if the intranasal form not covered 1 each 11  . glucose  blood (ACCU-CHEK GUIDE) test strip For use with insulin pump to check blood sugar 4 times a day. 400 each 12  . levothyroxine (SYNTHROID) 125 MCG tablet Take 1 tablet (125 mcg total) by mouth daily. 90 tablet 3  . TRI FEMYNOR 0.18/0.215/0.25 MG-35 MCG tablet Take 1 tablet by mouth daily. 1 Package 99   No current facility-administered medications on file prior to visit.   No Known Allergies Family History  Problem Relation Age  of Onset  . Allergies Father    PE: BP 110/80   Pulse 70   Ht 5' 2.25" (1.581 m)   Wt 145 lb (65.8 kg)   SpO2 99%   BMI 26.31 kg/m  Wt Readings from Last 3 Encounters:  03/24/19 145 lb (65.8 kg)  11/22/18 142 lb (64.4 kg)  09/06/18 142 lb 12.8 oz (64.8 kg)   Constitutional: Normal weight, in NAD Eyes: PERRLA, EOMI, no exophthalmos ENT: moist mucous membranes, no thyromegaly, no cervical lymphadenopathy Cardiovascular: RRR, No MRG Respiratory: CTA B Gastrointestinal: abdomen soft, NT, ND, BS+ Musculoskeletal: no deformities, strength intact in all 4 Skin: moist, warm, no rashes Neurological: no tremor with outstretched hands, DTR normal in all 4  ASSESSMENT: 1. DM1, uncontrolled, with complications - PN  2. Hypothyroidism  PLAN:  1. Patient with longstanding uncontrolled, type 1 diabetes, on insulin pump therapy.  Since last visit, she switched her pump to a 770 G [employer].  She likes the new pump.  She contacted me at the beginning of the month with a low blood sugar during the night but we could not download her pump and she could not tell me more details about the sugars so we ended up not changing her regimen. -At last visit, her sugars were above target at night and she was corrected by introducing random carbs into the pump.  I advised her to strengthen her insulin sensitivity factor.  We also lowered her targets at night.  I feel that her insulin to carb ratios were correct at that time.  She was still not bolusing 15 minutes before each meal  but was working towards this goal.  She was doing a better job with introducing carbs into the pump but she was not bolusing with coffee and I again advised her to do so.  I advised her to start with 10 g of carbs.  I did not change her basal rates then. -At this visit, her sugars are more fluctuating and she feels that this is because she is eating more carbs.  She is planning to start reducing these again.  She had more flatter glucose profiles when she was eating less carbs in the past.   -We reviewed together her pump and CGM downloads (will be scanned) and it appears that at this visit, she is not introducing enough carbs into the pump, only a fixed amount of usually 20 and occasionally 30 and only once or twice a day.  She is also bolusing after the sugars are already high after the meal, which favors large swings in her blood sugars.  We discussed about the importance of using Fiasp before the meals, sometimes even 10 minutes before but definitely not waiting until sugars are high after meals to bolus for that particular meal.  Also, I advised her to try to answer the entire amount of carbs that she is eating into the pump before the meal so the pump can calculate the bolus for the meal. -She tells me she is not bolusing consistently for coffee and I advised her to start doing so -She feels that her sugars are dropping at night so we made the following changes to prevent this: We decrease the basal rate from 12 AM to 3 AM, we relaxed her insulin to carb ratio from 12 AM to 5 AM, and I also relaxed her insulin sensitivity factor from 12 AM to 5:30 AM. -Another problem identified that she is not calibrating the sensor and so she  is kicked out of the automatic mode frequently.  I advised her to do this at least twice a day in the morning, and before going to bed but ideally 4 times a day. -I advised her to:  Patient Instructions  Please continue: - basal rates: 12 am: 1.4 >> 1.3 units/h 3 am: 1.3 6  am: 1.3 11 am: 1.1 5 pm: 1.15 9 pm: 1.25 - ICR:   12 am: 7 >> 9  5 am: 7  - target:  12 am: 120-120 5:30 am: 115-115 9 pm: 120-120 - ISF:   12 am: 35 >> 40  5:30: 35  9 pm: 35 - Insulin on Board: 4h - bolus wizard: on  Please bolus for coffee.  Please calibrate the sensor 2-4x a day, always at bedtime.  Enter all the carbs before the meal and start the bolus for them.  Please do the following approximately 10 minutes before every meal: - Enter carbs (C) - Enter sugars (S) - Start insulin bolus (I)   - we checked her HbA1c: 6.9% (slightly higher) - advised to check sugars at different times of the day - 4x a day, rotating check times - advised for yearly eye exams >> she is UTD - return to clinic in 3-4 months    2. Hypothyroidism -Patient with history of poorly controlled, longstanding, hypothyroidism..  She had very fluctuating TFTs in the past, likely secondary to lack of compliance with her levothyroxine doses. - latest thyroid labs reviewed with pt >> TSH was suppressed and we decreased the dose of her levothyroxine Lab Results  Component Value Date   TSH 0.02 (L) 01/03/2019   - she continues on LT4 125 mcg daily (dose decreased 11/29) - pt feels good on this dose. - we discussed about taking the thyroid hormone every day, with water, >30 minutes before breakfast, separated by >4 hours from acid reflux medications, calcium, iron, multivitamins. Pt. is taking it correctly. - will check thyroid tests today: TSH and fT4  - time spent with the patient: 35 min, of which >50% was spent in reviewing her pump and CGM downloads, discussing her hypo- and hyper-glycemic episodes, reviewing previous labs and pump settings and developing a plan to avoid hypo- and hyper-glycemia.   Office Visit on 03/24/2019  Component Date Value Ref Range Status  . Free T4 03/24/2019 1.6* 0.8 - 1.4 ng/dL Final  . TSH 03/24/2019 0.27* mIU/L Final   Comment:           Reference Range .            > or = 20 Years  0.40-4.50 .                Pregnancy Ranges           First trimester    0.26-2.66           Second trimester   0.55-2.73           Third trimester    0.43-2.91   . Hemoglobin A1C 03/24/2019 6.9* 4.0 - 5.6 % Final   TSH still low >> will decrease the LT4 to 112 mcg daily and recheck the TFTs at next OV  Philemon Kingdom, MD PhD [facility] Endocrinology

## 2019-03-24 NOTE — Patient Instructions (Signed)
Please continue: - basal rates: 12 am: 1.4 >> 1.3 units/h 3 am: 1.3 6 am: 1.3 11 am: 1.1 5 pm: 1.15 9 pm: 1.25 - ICR:   12 am: 7 >> 9  5 am: 7  - target:  12 am: 120-120 5:30 am: 115-115 9 pm: 120-120 - ISF:   12 am: 35 >> 40  5:30: 35  9 pm: 35 - Insulin on Board: 4h - bolus wizard: on  Please bolus for coffee.  Please calibrate the sensor 2-4x a day, always at bedtime.  Enter all the carbs before the meal and start the bolus for them.  Please do the following approximately 10 minutes before every meal: - Enter carbs (C) - Enter sugars (S) - Start insulin bolus (I)

## 2019-03-25 LAB — T4, FREE: Free T4: 1.6 ng/dL — ABNORMAL HIGH (ref 0.8–1.4)

## 2019-03-25 LAB — TSH: TSH: 0.27 mIU/L — ABNORMAL LOW

## 2019-03-26 MED ORDER — LEVOTHYROXINE SODIUM 112 MCG PO TABS: 112.0000 ug | ORAL_TABLET | Freq: Every day | ORAL | 3 refills | Status: DC

## 2019-03-27 MED FILL — LEVOTHYROXINE SODIUM 112 MC: 112 | 90 days supply | Qty: 90 | Fill #0

## 2019-04-12 MED FILL — FIASP 100 UNIT/ML SOLN: 100 | 28 days supply | Qty: 20 | Fill #8

## 2019-04-22 DIAGNOSIS — E1042 Type 1 diabetes mellitus with diabetic polyneuropathy: Secondary | ICD-10-CM | POA: Diagnosis not present

## 2019-04-22 DIAGNOSIS — E1065 Type 1 diabetes mellitus with hyperglycemia: Secondary | ICD-10-CM | POA: Diagnosis not present

## 2019-05-02 MED FILL — TRI FEMYNOR 28 TABLET: 0.18/0.215/ | 84 days supply | Qty: 84 | Fill #3

## 2019-06-06 DIAGNOSIS — E1065 Type 1 diabetes mellitus with hyperglycemia: Secondary | ICD-10-CM | POA: Diagnosis not present

## 2019-06-06 DIAGNOSIS — E1042 Type 1 diabetes mellitus with diabetic polyneuropathy: Secondary | ICD-10-CM | POA: Diagnosis not present

## 2019-06-16 ENCOUNTER — Other Ambulatory Visit: Payer: Self-pay | Admitting: Internal Medicine

## 2019-06-16 MED FILL — FIASP 100 UNIT/ML SOLN: 100 | 84 days supply | Qty: 60 | Fill #0

## 2019-06-20 MED FILL — LEVOTHYROXINE SODIUM 112 MC: 112 | 90 days supply | Qty: 90 | Fill #1

## 2019-06-23 ENCOUNTER — Ambulatory Visit: Payer: 59 | Attending: Internal Medicine

## 2019-06-23 ENCOUNTER — Other Ambulatory Visit: Payer: Self-pay

## 2019-06-23 DIAGNOSIS — Z20822 Contact with and (suspected) exposure to covid-19: Secondary | ICD-10-CM

## 2019-06-24 LAB — NOVEL CORONAVIRUS, NAA: SARS-CoV-2, NAA: NOT DETECTED

## 2019-06-24 LAB — SARS-COV-2, NAA 2 DAY TAT

## 2019-06-26 ENCOUNTER — Ambulatory Visit: Payer: 59 | Admitting: Internal Medicine

## 2019-07-17 DIAGNOSIS — E1065 Type 1 diabetes mellitus with hyperglycemia: Secondary | ICD-10-CM | POA: Diagnosis not present

## 2019-07-17 DIAGNOSIS — E1042 Type 1 diabetes mellitus with diabetic polyneuropathy: Secondary | ICD-10-CM | POA: Diagnosis not present

## 2019-07-26 MED FILL — TRI FEMYNOR 28 TABLET: 0.18/0.215/ | 84 days supply | Qty: 84 | Fill #4

## 2019-08-28 DIAGNOSIS — E1042 Type 1 diabetes mellitus with diabetic polyneuropathy: Secondary | ICD-10-CM | POA: Diagnosis not present

## 2019-08-28 DIAGNOSIS — E1065 Type 1 diabetes mellitus with hyperglycemia: Secondary | ICD-10-CM | POA: Diagnosis not present

## 2019-08-31 ENCOUNTER — Ambulatory Visit (INDEPENDENT_AMBULATORY_CARE_PROVIDER_SITE_OTHER): Payer: 59 | Admitting: Internal Medicine

## 2019-08-31 ENCOUNTER — Other Ambulatory Visit: Payer: Self-pay

## 2019-08-31 ENCOUNTER — Encounter: Payer: Self-pay | Admitting: Internal Medicine

## 2019-08-31 ENCOUNTER — Other Ambulatory Visit: Payer: Self-pay | Admitting: Internal Medicine

## 2019-08-31 VITALS — BP 110/78 | HR 75 | Ht 62.25 in | Wt 147.0 lb

## 2019-08-31 DIAGNOSIS — E1065 Type 1 diabetes mellitus with hyperglycemia: Secondary | ICD-10-CM | POA: Diagnosis not present

## 2019-08-31 DIAGNOSIS — E039 Hypothyroidism, unspecified: Secondary | ICD-10-CM

## 2019-08-31 DIAGNOSIS — E1040 Type 1 diabetes mellitus with diabetic neuropathy, unspecified: Secondary | ICD-10-CM | POA: Diagnosis not present

## 2019-08-31 DIAGNOSIS — IMO0002 Reserved for concepts with insufficient information to code with codable children: Secondary | ICD-10-CM

## 2019-08-31 LAB — POCT GLYCOSYLATED HEMOGLOBIN (HGB A1C): Hemoglobin A1C: 6.8 % — AB (ref 4.0–5.6)

## 2019-08-31 LAB — COMPREHENSIVE METABOLIC PANEL
ALT: 9 U/L (ref 0–35)
AST: 14 U/L (ref 0–37)
Albumin: 4.5 g/dL (ref 3.5–5.2)
Alkaline Phosphatase: 53 U/L (ref 39–117)
BUN: 9 mg/dL (ref 6–23)
CO2: 28 mEq/L (ref 19–32)
Calcium: 9.7 mg/dL (ref 8.4–10.5)
Chloride: 103 mEq/L (ref 96–112)
Creatinine, Ser: 1.03 mg/dL (ref 0.40–1.20)
GFR: 67.45 mL/min (ref >60.00)
Glucose, Bld: 105 mg/dL — ABNORMAL HIGH (ref 70–99)
Potassium: 3.8 mEq/L (ref 3.5–5.1)
Sodium: 138 mEq/L (ref 135–145)
Total Bilirubin: 0.6 mg/dL (ref 0.2–1.2)
Total Protein: 7.4 g/dL (ref 6.0–8.3)

## 2019-08-31 LAB — T4, FREE: Free T4: 1.04 ng/dL (ref 0.60–1.60)

## 2019-08-31 LAB — MICROALBUMIN / CREATININE URINE RATIO
Creatinine,U: 100.6 mg/dL
Microalb Creat Ratio: 1 mg/g (ref 0.0–30.0)
Microalb, Ur: 1 mg/dL (ref 0.0–1.9)

## 2019-08-31 LAB — LIPID PANEL
Cholesterol: 166 mg/dL (ref 0–200)
HDL: 54.3 mg/dL (ref >39.00)
LDL Cholesterol: 93 mg/dL (ref 0–99)
NonHDL: 111.75
Total CHOL/HDL Ratio: 3
Triglycerides: 93 mg/dL (ref 0.0–149.0)
VLDL: 18.6 mg/dL (ref 0.0–40.0)

## 2019-08-31 LAB — TSH: TSH: 7.24 u[IU]/mL — ABNORMAL HIGH (ref 0.35–4.50)

## 2019-08-31 NOTE — Progress Notes (Addendum)
Patient ID: MARAKI MACQUARRIE, female   DOB: [birth year], 19 y.o.   MRN: [id number]   This visit occurred during the SARS-CoV-2 public health emergency.  Safety protocols were in place, including screening questions prior to the visit, additional usage of staff PPE, and extensive cleaning of exam room while observing appropriate contact time as indicated for disinfecting solutions.   HPI: Melissa Reese is a 19 y.o.-year-old female, initially referred by her PCP, Dr. Juleen China, returning for follow-up for DM1  Dx 2003, uncontrolled, with complications (PN). Prev. seen at [facility]. Last visit with me 5 months ago.  Before last visit, she had low blood sugars in the morning and after correction of high blood sugars.  We adjusted her insulin pump settings at that time.  Insulin pump: - Started at 19 years old - OneTouch ping - started 01/2015 - Medtronic 670 G insulin pump + guardian CGM-started 09/2016 (band aid to keep the CGM in place: Simplatch). She had a crack in her pump -received another pump 01/2018.   - Most recent: Medtronic 770 G -started 01/2019.  Meter: Accu-Chek guide link  CGM: -Medtronic Guardian  Insulin: -She uses Fiasp in the pump  DM1:  Reviewed HbA1c levels: Lab Results  Component Value Date   HGBA1C 6.9 (A) 03/24/2019   HGBA1C 6.7 (A) 11/22/2018   HGBA1C 8.0 (A) 07/19/2018   HGBA1C 8.0 (A) 03/14/2018   HGBA1C 8.7 (H) 08/24/2017   HGBA1C 8.9 04/09/2017   HGBA1C 8.4 12/22/2016   HGBA1C 9.7 (H) 09/07/2016  Prev.  02/20/2016: HbA1c 7.2% 11/21/2015: HbA1c 7.4% 08/21/2015: HbA1c 7.7% 04/24/2015: HbA1c 7.8%  Pump settings: - basal rates: 12 am: 1.4 >> 1.3 units/h 3 am: 1.3 6 am: 1.3 11 am: 1.1 5 pm: 1.15 9 pm: 1.25 - ICR:   12 am: 7 >> 9  5 am: 7  - target:  12 am: 120-120 5:30 am: 115-115 9 pm: 120-120 - ISF:   12 am: 35 >> 40  5:30: 35  9 pm: 35 - Insulin on Board: 4h - bolus wizard: on  TDD from basal insulin: 72% (29 units) >>  67% (24 units) >> 70% (23 units) >> 69% TDD from bolus insulin: 28% (11 units) >> 33% (12 units) >> 30% (10 units) >> 31% TDD 32-50 units - extended bolusing: not using - changes infusion site: Every every 4 to 5 days >> every 6-7 days  She checks her sugars more than 4 times a day with her CGM, calibrates the sensor 1.6 times a day  CGM parameters:  - Average from CGM:  174+/-72 >> 149+/-42 >> 150+/-44 >> 161+/-57 >> 151 +/-58 - Average from manual BG checks: 197+/-86 >> 202+/-106 >> 120 +/-53 >> 173+/-82  Time in range:  - very low (40-50): 0% >> 0% >> 1% >> 0% >> 0% >> 1% >> 1% - low (50-70): 4% >> 1% >> 2% >> 3% >> 0% >> 1% >> 4% - normal range (70-180): 63% >> 49% >> 51% >> 74% >> 77% >> 63% >> 70% - high sugars (180-250): 23% >> 30% >> 32% >> 22% >> 19% >> 26% >> 20% - very high sugars (250-400): 10% >> 20% >> 14% >> 1% >> 3% >> 7% >>   - in auto mode: 13% >> 25% >> 0% >> 5% >> 74 % >> 57% of the time - in manual mode: 87% >> 75% >> 100% >> 95% >> 26 % >> 43% of the time   Previously:  Previously:   Lowest sugar:  LO >> 60 >> 48 >> 50s. She has hypoglycemia awareness at 80.  She had previous visits to the ED for hypoglycemia years ago.  She has intranasal one at home. Highest sugar: >400 >> 300 >> 300 >> 300s.  No previous DKA admissions.  -No CKD, last BUN/creatinine:  Lab Results  Component Value Date   BUN 7 07/19/2018   BUN 7 08/24/2017   CREATININE 0.83 07/19/2018   CREATININE 0.84 08/24/2017  Not on ACE inhibitor/ARB. -+ HL; last set of lipids: Lab Results  Component Value Date   CHOL 149 07/19/2018   HDL 45.70 07/19/2018   LDLCALC 72 07/19/2018   TRIG 159.0 (H) 07/19/2018   CHOLHDL 3 07/19/2018  08/21/2015: 145/148/45/76 Not on a statin. - latest eye exam: 03/2019: No DR; eyesight improved - + numbness and tingling in her feet.   Hypothyroidism:  Pt is on levothyroxine 112 mcg daily (decreased at last OV), taken: - missed few doses in last month  (maybe 2...) as she sometimes needs to be at work at 5 in the morning - [employer]. - in am - fasting - at least 30 min from b'fast - no Ca, Fe, MVI, PPIs - not on Biotin  Reviewed her TFTs: Lab Results  Component Value Date   TSH 0.27 (L) 03/24/2019   TSH 0.02 (L) 01/03/2019   TSH 0.01 (L) 11/22/2018   TSH 0.47 07/19/2018   TSH 5.29 (H) 03/14/2018   TSH 4.16 10/11/2017   TSH 21.48 (H) 08/24/2017   TSH 0.813 04/09/2017   TSH 0.20 (L) 12/22/2016   TSH 44.95 Repeated and verified X2. (H) 09/17/2016  11/21/2015: TSH 6.397 08/21/2015: TSH 41.268 04/24/2015: TSH 14.788 08/16/2014: TSH 0.407  Pt denies: - feeling nodules in neck - hoarseness - dysphagia - choking - SOB with lying down  She started college in August 2018.  ROS: Constitutional: no weight gain/no weight loss, no fatigue, no subjective hyperthermia, no subjective hypothermia Eyes: no blurry vision, no xerophthalmia ENT: no sore throat, + see HPI Cardiovascular: no CP/no SOB/no palpitations/no leg swelling Respiratory: no cough/no SOB/no wheezing Gastrointestinal: no N/no V/no D/no C/no acid reflux Musculoskeletal: no muscle aches/no joint aches Skin: no rashes, no hair loss Neurological: no tremors/+ numbness/+ tingling/no dizziness  I reviewed pt's medications, allergies, PMH, social hx, family hx, and changes were documented in the history of present illness. Otherwise, unchanged from my initial visit note.  Past Medical History:  Diagnosis Date  . Diabetes mellitus without complication ([place])   . Thyroid disease     Social History   Social History  . Marital status: Single    Spouse name: N/A  . Number of children: 0   Occupational History  . student   Social History Main Topics  . Smoking status: Never Smoker  . Smokeless tobacco: Never Used  . Alcohol use No  . Drug use: No   Current Outpatient Medications on File Prior to Visit  Medication Sig Dispense Refill  . FIASP 100 UNIT/ML SOLN  USE UP TO 70 UNITS PER DAY IN PUMP 63 mL 3  . Glucagon (BAQSIMI ONE PACK) 3 MG/DOSE POWD Place 3 mg into the nose once as needed for up to 1 dose. Please replace with Glucagon im 1 kit if the intranasal form not covered 1 each 11  . glucose blood (ACCU-CHEK GUIDE) test strip For use with insulin pump to check blood sugar 4 times a day. 400 each 12  . levothyroxine (  SYNTHROID) 112 MCG tablet Take 1 tablet (112 mcg total) by mouth daily. 90 tablet 3  . TRI FEMYNOR 0.18/0.215/0.25 MG-35 MCG tablet Take 1 tablet by mouth daily. 1 Package 99   No current facility-administered medications on file prior to visit.   No Known Allergies Family History  Problem Relation Age of Onset  . Allergies Father    PE: BP 110/78   Pulse 75   Ht 5' 2.25" (1.581 m)   Wt 147 lb (66.7 kg)   SpO2 98%   BMI 26.67 kg/m  Wt Readings from Last 3 Encounters:  08/31/19 147 lb (66.7 kg)  03/24/19 145 lb (65.8 kg)  11/22/18 142 lb (64.4 kg)   Constitutional: normal weight, in NAD Eyes: PERRLA, EOMI, no exophthalmos ENT: moist mucous membranes, no thyromegaly, no cervical lymphadenopathy Cardiovascular: RRR, No MRG Respiratory: CTA B Gastrointestinal: abdomen soft, NT, ND, BS+ Musculoskeletal: no deformities, strength intact in all 4 Skin: moist, warm, no rashes Neurological: no tremor with outstretched hands, DTR normal in all 4  ASSESSMENT: 1. DM1, uncontrolled, with complications - PN  2. Hypothyroidism  PLAN:  1. Patient with longstanding, uncontrolled diabetes, on insulin pump therapy.  Before last visit, she switched to a [place].  She likes the new pump.  She had low blood sugars at night before last visit so at that time we decreased her 12 AM to 3 AM basal rates, weight relaxed her insulin to carb ratios and sensitivity factor overnight.  At that time, an HbA1c was 6.9%, still at goal, but higher than before.  Sugars were more fluctuating and she felt that this was because she was eating  more carbs.  She was planning to start a lower carb diet.  She had a lot of glucose profiles when she was eating less carbs in the past.  Reviewing her pump download at that time she was introducing only takes the months of carbs in her pump, 20-30 only once or twice a day.  She was also bolusing after that sugars were already high after a meal, which they were not large swings in her blood sugars.  I advised to inject BS before the meal even 10 minutes before, and also, to introduce the right amount of carbs into the pump.  At that time she was also not bolusing consistent coffee and I advised her to do so. -At this visit, she is still not entering carbs and bolusing before every meal.  She has days during which she only corrected high blood sugars, without bolusing we discussed that without entering carbs bolusing before every meal, -Also, she is changing her reservoir and infusion set every 7 days or so, we discussed that ideally this is done every 3 to 4 days to avoid degradation of his scarring, and fluctuating blood sugars -She also describes dropping her blood sugars occasionally after she is not using temporary basal rates.  Exercising with these are about doing so for the duration of exercise and sometimes even 2hours.  She started a gym membership at [employer] and she is going there every day around 6 PM. -She does bolus for coffee now. -She is still not calibrating the sensors at bedtime so she is kicked out of the automatic mode overnight.  We discussed that it is very important to calibrate 2-4 times a day but especially before going to bed at night. -She gets too much insulin after correcting high blood sugars so for now I advised her to relax  her insulin sensitivity factor -I do not feel we need to make other changes in her regimen -At today's visit, she with a low blood sugar in the office, and 64 she was given juice and Coca-Cola.  It appears that she did not bolus before lunch but  corrected post lunch hyperglycemia.  We discussed that this practice in this is not only postprandial hyperglycemia hypoglycemia following the high CBG peak underlined the importance to always bolus before the meal -I advised her to:  Patient Instructions  Please continue: - basal rates: 12 am: 1.3 units/h 3 am: 1.3 6 am: 1.3 11 am: 1.1 5 pm: 1.15 9 pm: 1.25 - ICR:   12 am: 9  5 am: 7  - target:  12 am: 120-120 5:30 am: 115-115 9 pm: 120-120 - ISF:   12 am: 40 >> 45  5:30: 35 >> 45  9 pm: 35 >> 45 - Insulin on Board: 4h - bolus wizard: on . Please calibrate the sensor 2-4x a day, always at bedtime.  May need to do a temporary basal rate of 80% during and eve 2h after exercise.  Change the site/tubing/insulin every 3-4 days: Sun and Wed  Please do the following approximately before every meal: - Enter carbs (C) - Enter sugars (S) - Start insulin bolus (I)  Please continue Levothyroxine 112 mcg daily.  Take the thyroid hormone every day, with water, at least 30 minutes before breakfast, separated by at least 4 hours from: - acid reflux medications - calcium - iron - multivitamins  Please return in 4 months.  - we checked her HbA1c: 6.8% (slightly better) - advised to check sugars at different times of the day - 4x a day, rotating check times - advised for yearly eye exams >> she is UTD -We will check annual labs today - return to clinic in 4 months   2. Hypothyroidism -Patient with history of poorly controlled, longstanding, hypothyroidism.  She had very fluctuating TFTs in the past, likely secondary to lack of compliance with her levothyroxine doses. - latest thyroid labs reviewed with pt >> low TSH, after which we decreased the dose of Lab Results  Component Value Date   TSH 0.27 (L) 03/24/2019   - she continues on LT4 112 mcg daily - pt feels good on this dose. - we discussed about taking the thyroid hormone every day, with water, >30 minutes before  breakfast, separated by >4 hours from acid reflux medications, calcium, iron, multivitamins. Pt. is taking it correctly. - will check thyroid tests today: TSH and fT4 - If labs are abnormal, she will need to return for repeat TFTs in 1.5 months  - time spent with the patient: 40 min, of which >50% was spent in reviewing her pump and CGM downloads, discussing her hypo- and hyper-glycemic episodes, reviewing previous labs and pump settings and developing a plan to avoid hypo- and hyper-glycemia.   Component     Latest Ref Rng & Units 08/31/2019  Sodium     135 - 145 mEq/L 138  Potassium     3.5 - 5.1 mEq/L 3.8  Chloride     96 - 112 mEq/L 103  CO2     19 - 32 mEq/L 28  Glucose     70 - 99 mg/dL 105 (H)  BUN     6 - 23 mg/dL 9  Creatinine     0.40 - 1.20 mg/dL 1.03  Total Bilirubin     0.2 - 1.2 mg/dL  0.6  Alkaline Phosphatase     39 - 117 U/L 53  AST     0 - 37 U/L 14  ALT     0 - 35 U/L 9  Total Protein     6.0 - 8.3 g/dL 7.4  Albumin     3.5 - 5.2 g/dL 4.5  Calcium     8.4 - 10.5 mg/dL 9.7  GFR     >60.00 mL/min 67.45  Cholesterol     0 - 200 mg/dL 166  Triglycerides     0 - 149 mg/dL 93.0  HDL Cholesterol     >39.00 mg/dL 54.30  VLDL     0.0 - 40.0 mg/dL 18.6  LDL (calc)     0 - 99 mg/dL 93  Total CHOL/HDL Ratio      3  NonHDL      111.75  Microalb, Ur     0.0 - 1.9 mg/dL 1.0  Creatinine,U     mg/dL 100.6  MICROALB/CREAT RATIO     0.0 - 30.0 mg/g 1.0   Component     Latest Ref Rng & Units 08/31/2019  TSH     0.35 - 4.50 uIU/mL 7.24 (H)  T4,Free(Direct)     0.60 - 1.60 ng/dL 1.04   TFTs abnormal: TSH elevated, possibly due to missed levothyroxine doses.  We will continue the same dose of levothyroxine and recheck her tests at next visit.  Philemon Kingdom, MD PhD [facility]

## 2019-08-31 NOTE — Addendum Note (Signed)
Addended by: Adline Mango I on: 08/31/2019 04:39 PM   Modules accepted: Orders

## 2019-08-31 NOTE — Patient Instructions (Addendum)
Please continue: - basal rates: 12 am: 1.3 units/h 3 am: 1.3 6 am: 1.3 11 am: 1.1 5 pm: 1.15 9 pm: 1.25 - ICR:   12 am: 9  5 am: 7  - target:  12 am: 120-120 5:30 am: 115-115 9 pm: 120-120 - ISF:   12 am: 40 >> 45  5:30: 35 >> 45  9 pm: 35 >> 45 - Insulin on Board: 4h - bolus wizard: on .  Please calibrate the sensor 2-4x a day, always at bedtime.  May need to do a temporary basal rate of 80% during and eve 2h after exercise.  Change the site/tubing/insulin every 3-4 days: Sun and Wed  Please do the following approximately before every meal: - Enter carbs (C) - Enter sugars (S) - Start insulin bolus (I)  Please continue Levothyroxine 112 mcg daily.  Take the thyroid hormone every day, with water, at least 30 minutes before breakfast, separated by at least 4 hours from: - acid reflux medications - calcium - iron - multivitamins  Please return in 4 months.

## 2019-09-14 MED FILL — LEVOTHYROXINE SODIUM 112 MC: 112 | 90 days supply | Qty: 90 | Fill #2

## 2019-09-14 MED FILL — FIASP 100 UNIT/ML SOLN: 100 | 84 days supply | Qty: 60 | Fill #1

## 2019-09-18 MED FILL — ACCU-CHEK GUIDE STRP: 50 days supply | Qty: 200 | Fill #2

## 2019-09-23 ENCOUNTER — Encounter: Payer: Self-pay | Admitting: Internal Medicine

## 2019-10-11 DIAGNOSIS — E1065 Type 1 diabetes mellitus with hyperglycemia: Secondary | ICD-10-CM | POA: Diagnosis not present

## 2019-10-11 DIAGNOSIS — E1042 Type 1 diabetes mellitus with diabetic polyneuropathy: Secondary | ICD-10-CM | POA: Diagnosis not present

## 2019-10-12 ENCOUNTER — Other Ambulatory Visit: Payer: 59

## 2019-10-12 ENCOUNTER — Other Ambulatory Visit: Payer: Self-pay

## 2019-10-12 DIAGNOSIS — Z20822 Contact with and (suspected) exposure to covid-19: Secondary | ICD-10-CM

## 2019-10-13 DIAGNOSIS — E1042 Type 1 diabetes mellitus with diabetic polyneuropathy: Secondary | ICD-10-CM | POA: Diagnosis not present

## 2019-10-13 DIAGNOSIS — E1065 Type 1 diabetes mellitus with hyperglycemia: Secondary | ICD-10-CM | POA: Diagnosis not present

## 2019-10-14 LAB — SARS-COV-2, NAA 2 DAY TAT

## 2019-10-14 LAB — NOVEL CORONAVIRUS, NAA: SARS-CoV-2, NAA: NOT DETECTED

## 2019-10-16 ENCOUNTER — Telehealth: Payer: Self-pay | Admitting: Family Medicine

## 2019-10-16 NOTE — Telephone Encounter (Signed)
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 Fax number is [phone]. Attn Athletic director Justin Mend.

## 2019-10-16 NOTE — Telephone Encounter (Signed)
Immunizations has been faxed. Called pt to lvm, but VM is full. I was not able to leave vm.

## 2019-11-17 DIAGNOSIS — E1065 Type 1 diabetes mellitus with hyperglycemia: Secondary | ICD-10-CM | POA: Diagnosis not present

## 2019-11-17 DIAGNOSIS — E1042 Type 1 diabetes mellitus with diabetic polyneuropathy: Secondary | ICD-10-CM | POA: Diagnosis not present

## 2019-11-22 ENCOUNTER — Encounter: Payer: 59 | Admitting: Family Medicine

## 2019-11-23 ENCOUNTER — Other Ambulatory Visit: Payer: Self-pay | Admitting: Family Medicine

## 2019-11-23 ENCOUNTER — Other Ambulatory Visit: Payer: Self-pay

## 2019-11-23 ENCOUNTER — Encounter: Payer: Self-pay | Admitting: Family Medicine

## 2019-11-23 DIAGNOSIS — Z3041 Encounter for surveillance of contraceptive pills: Secondary | ICD-10-CM

## 2019-11-23 MED FILL — TRI FEMYNOR 28 TABLET: 0.18/0.215/ | 84 days supply | Qty: 84 | Fill #0

## 2020-01-02 DIAGNOSIS — E1065 Type 1 diabetes mellitus with hyperglycemia: Secondary | ICD-10-CM | POA: Diagnosis not present

## 2020-01-02 DIAGNOSIS — E1042 Type 1 diabetes mellitus with diabetic polyneuropathy: Secondary | ICD-10-CM | POA: Diagnosis not present

## 2020-01-02 MED FILL — ACCU-CHEK GUIDE STRP: 50 days supply | Qty: 200 | Fill #3

## 2020-01-04 ENCOUNTER — Telehealth: Payer: Self-pay

## 2020-01-04 ENCOUNTER — Other Ambulatory Visit: Payer: Self-pay | Admitting: Internal Medicine

## 2020-01-04 DIAGNOSIS — E1065 Type 1 diabetes mellitus with hyperglycemia: Secondary | ICD-10-CM | POA: Diagnosis not present

## 2020-01-04 DIAGNOSIS — E1042 Type 1 diabetes mellitus with diabetic polyneuropathy: Secondary | ICD-10-CM | POA: Diagnosis not present

## 2020-01-04 MED ORDER — FIASP 100 UNIT/ML ~~LOC~~ SOLN: SUBCUTANEOUS | 3 refills | Status: DC

## 2020-01-04 MED FILL — FIASP 100 UNIT/ML SOLN: 100 | 84 days supply | Qty: 60 | Fill #0

## 2020-01-04 NOTE — Telephone Encounter (Signed)
Received fax from [facility] that [place] needed a prior [place]. Completed- approved:  The request has been approved. The authorization is effective for a maximum of 12 fills from 01/03/2020 to 01/01/2021, as long as the member is enrolled in their current health plan. The request was approved as submitted. A written notification letter will follow with additional details.  Will resend to pharmacy

## 2020-01-10 ENCOUNTER — Encounter: Payer: Self-pay | Admitting: Internal Medicine

## 2020-01-10 ENCOUNTER — Ambulatory Visit: Payer: 59 | Admitting: Internal Medicine

## 2020-01-10 ENCOUNTER — Other Ambulatory Visit: Payer: Self-pay

## 2020-01-10 VITALS — BP 122/82 | HR 73 | Ht 62.0 in | Wt 145.0 lb

## 2020-01-10 DIAGNOSIS — E1065 Type 1 diabetes mellitus with hyperglycemia: Secondary | ICD-10-CM

## 2020-01-10 DIAGNOSIS — IMO0002 Reserved for concepts with insufficient information to code with codable children: Secondary | ICD-10-CM

## 2020-01-10 DIAGNOSIS — E039 Hypothyroidism, unspecified: Secondary | ICD-10-CM | POA: Diagnosis not present

## 2020-01-10 DIAGNOSIS — E1040 Type 1 diabetes mellitus with diabetic neuropathy, unspecified: Secondary | ICD-10-CM | POA: Diagnosis not present

## 2020-01-10 LAB — POCT GLYCOSYLATED HEMOGLOBIN (HGB A1C): Hemoglobin A1C: 7.2 % — AB (ref 4.0–5.6)

## 2020-01-10 LAB — TSH: TSH: 4.88 u[IU]/mL — ABNORMAL HIGH (ref 0.35–4.50)

## 2020-01-10 LAB — T4, FREE: Free T4: 1.01 ng/dL (ref 0.60–1.60)

## 2020-01-10 NOTE — Patient Instructions (Addendum)
Please use the following pump settings: - basal rates: 12 am: 1.3 units/h >> 1.15 3 am: 1.3 >> 1.15 6 am: 1.3 (may need to decrease to 1.2) 11 am: 1.1 5 pm: 1.15 9 pm: 1.25 - ICR:   12 am: 9  5 am: 7  - target:  12 am: 120-120 5:30 am: 115-115 9 pm: 120-120 - ISF:  12 am: 45  5:30 am: 45  9 pm: 45 - Insulin on Board: 4h - bolus wizard: on  Calibrate the sensor 2-4 times a day.  You may need a temporary basal rate of 80% for the night after exercise.  Change the site/tubing/insulin every 3-4 days: Sun and Wed  Please do the following approximately before every meal: - Enter carbs (C) - Enter sugars (S) - Start insulin bolus (I)  Please continue Levothyroxine 112 mcg daily.  Take the thyroid hormone every day, with water, at least 30 minutes before breakfast, separated by at least 4 hours from: - acid reflux medications - calcium - iron - multivitamins  Please return in 4 months.

## 2020-01-10 NOTE — Progress Notes (Signed)
Patient ID: Melissa Reese, female   DOB: Jul 14, 2000, 19 y.o.   MRN: [id number]   This visit occurred during the SARS-CoV-2 public health emergency.  Safety protocols were in place, including screening questions prior to the visit, additional usage of staff PPE, and extensive cleaning of exam room while observing appropriate contact time as indicated for disinfecting solutions.   HPI: Melissa Reese is a 19 y.o.-year-old female, initially referred by her PCP, Dr. Juleen China, returning for follow-up for DM1  Dx 2003, uncontrolled, with complications (PN). Prev. seen at [facility]. Last visit with me 4 months ago.  She is in college, working full time and also Journalist, newspaper.  She is dropping her sugars overnight and waking with hypoglycemia >> she then tries to correct it but gets nauseated when she eats and sugars increase significantly.  Insulin pump: - Started at 19 years old - OneTouch ping - started 01/2015 - Medtronic 670 G insulin pump + guardian CGM-started 09/2016 (band aid to keep the CGM in place: Simplatch). She had a crack in her pump -received another pump 01/2018.   - Most recent: Medtronic 770 G started 01/2019.  Meter: Accu-Chek guide link  CGM: -Medtronic Guardian  Insulin: -FiAsp ([place] approved 01/04/2020)  DM1:  Reviewed HbA1c levels: Lab Results  Component Value Date   HGBA1C 6.8 (A) 08/31/2019   HGBA1C 6.9 (A) 03/24/2019   HGBA1C 6.7 (A) 11/22/2018   HGBA1C 8.0 (A) 07/19/2018   HGBA1C 8.0 (A) 03/14/2018   HGBA1C 8.7 (H) 08/24/2017   HGBA1C 8.9 04/09/2017   HGBA1C 8.4 12/22/2016   HGBA1C 9.7 (H) 09/07/2016  Prev.  02/20/2016: HbA1c 7.2% 11/21/2015: HbA1c 7.4% 08/21/2015: HbA1c 7.7% 04/24/2015: HbA1c 7.8%  Pump settings: - basal rates: 12 am: 1.3 units/h 3 am: 1.3 6 am: 1.3 11 am: 1.1 5 pm: 1.15 9 pm: 1.25 - ICR:   12 am: 9  5 am: 7  - target:  12 am: 120-120 5:30 am: 115-115 9 pm: 120-120 - ISF:   12 am: 40 >> 45  5:30: 35 >> 45  9  pm: 35 >> 45 - Insulin on Board: 4h - bolus wizard: on  TDD from basal insulin: 70% (23 units) >> 69% TDD from bolus insulin: 30% (10 units) >> 31% TDD 32 to 60 units. - extended bolusing: not using - changes infusion site: Every every 4 to 5 days >> every 6-7 days >> still every 7 days.   She checks her sugars more than 4 times a day with her CGM. She calibrates the sensor 1.6 times a day.  CGM parameters:  - Average from CGM:  161+/-57 >> 151 +/-58 >> 177+/-78 - Average from manual BG checks: 120 +/-53 >> 173+/-82 >> 174 +/- 102  Time in range:  - very low (40-50): 1% >> 1% >> 1% - low (50-70): 1% >> 4% >> 6% - normal range (70-180): 63% >> 70% >> 50% - high sugars (180-250): 26% >> 20% >> 25% - very high sugars (250-400):  3% >> 7% >> 18%  - in auto mode: 5% >> 74 % >> 57% >> 59% of the time - in manual mode: 95% >> 26 % >> 43% >> 41% of the time    Previously:   Previously:   Lowest sugar:  LO>> .Marland Kitchen.50s >> 44 mostly in am. She has hypoglycemia awareness at 80.  She had previous visits to the ED for hypoglycemia years ago. She ran out of intranasal glucagon kit at home. Highest  sugar: >400 ... >> 300s. No previous DKA admissions.  -No CKD, last BUN/creatinine:  Lab Results  Component Value Date   BUN 9 08/31/2019   BUN 7 07/19/2018   CREATININE 1.03 08/31/2019   CREATININE 0.83 07/19/2018  Not on ACE inhibitor/ARB. -+ HL; last set of lipids: Lab Results  Component Value Date   CHOL 166 08/31/2019   HDL 54.30 08/31/2019   LDLCALC 93 08/31/2019   TRIG 93.0 08/31/2019   CHOLHDL 3 08/31/2019  08/21/2015: 145/148/45/76 Not on a statin. - latest eye exam: 03/2019: No DR; eyesight improved - she has numbness and tingling in her feet.   Hypothyroidism:  Pt is on levothyroxine 112 mcg daily, taken: - Daily (at last visit she was missing doses when she had to be at work at 5 AM at [employer]) - in am - fasting - at least 30 min from b'fast - no calcium - no  iron - no multivitamins - no PPIs - not on Biotin  Reviewed her TFTs: Lab Results  Component Value Date   TSH 7.24 (H) 08/31/2019   TSH 0.27 (L) 03/24/2019   TSH 0.02 (L) 01/03/2019   TSH 0.01 (L) 11/22/2018   TSH 0.47 07/19/2018   TSH 5.29 (H) 03/14/2018   TSH 4.16 10/11/2017   TSH 21.48 (H) 08/24/2017   TSH 0.813 04/09/2017   TSH 0.20 (L) 12/22/2016  11/21/2015: TSH 6.397 08/21/2015: TSH 41.268 04/24/2015: TSH 14.788 08/16/2014: TSH 0.407  Pt denies: - feeling nodules in neck - hoarseness - dysphagia - choking - SOB with lying down  She started college in August 2018 >> May 2022. Sociology major.   ROS: Constitutional: no weight gain/no weight loss, no fatigue, no subjective hyperthermia, no subjective hypothermia Eyes: no blurry vision, no xerophthalmia ENT: no sore throat, + see HPI Cardiovascular: no CP/no SOB/no palpitations/no leg swelling Respiratory: no cough/no SOB/no wheezing Gastrointestinal: + N/no V/no D/no C/no acid reflux Musculoskeletal: no muscle aches/no joint aches Skin: no rashes, no hair loss Neurological: no tremors/+ numbness/+ tingling/no dizziness + Irregular menses after running out of her OCPs.  I reviewed pt's medications, allergies, PMH, social hx, family hx, and changes were documented in the history of present illness. Otherwise, unchanged from my initial visit note.  Past Medical History:  Diagnosis Date  . Diabetes mellitus without complication ([place])   . Thyroid disease     Social History   Social History  . Marital status: Single    Spouse name: N/A  . Number of children: 0   Occupational History  . student   Social History Main Topics  . Smoking status: Never Smoker  . Smokeless tobacco: Never Used  . Alcohol use No  . Drug use: No   Current Outpatient Medications on File Prior to Visit  Medication Sig Dispense Refill  . FIASP 100 UNIT/ML SOLN Use up to 70 units per day in the pump 63 mL 3  . Glucagon (BAQSIMI  ONE PACK) 3 MG/DOSE POWD Place 3 mg into the nose once as needed for up to 1 dose. Please replace with Glucagon im 1 kit if the intranasal form not covered 1 each 11  . glucose blood (ACCU-CHEK GUIDE) test strip For use with insulin pump to check blood sugar 4 times a day. 400 each 12  . levothyroxine (SYNTHROID) 112 MCG tablet Take 1 tablet (112 mcg total) by mouth daily. 90 tablet 3  . TRI FEMYNOR 0.18/0.215/0.25 MG-35 MCG tablet TAKE 1 TABLET BY MOUTH DAILY. 84 tablet  PRN   No current facility-administered medications on file prior to visit.   No Known Allergies Family History  Problem Relation Age of Onset  . Allergies Father    PE: BP 122/82   Pulse 73   Ht '5\' 2"'  (1.575 m)   Wt 145 lb (65.8 kg)   SpO2 97%   BMI 26.52 kg/m  Wt Readings from Last 3 Encounters:  01/10/20 145 lb (65.8 kg)  08/31/19 147 lb (66.7 kg)  03/24/19 145 lb (65.8 kg)   Constitutional: normal weight, in NAD Eyes: PERRLA, EOMI, no exophthalmos ENT: moist mucous membranes, no thyromegaly, no cervical lymphadenopathy Cardiovascular: RRR, No MRG Respiratory: CTA B Gastrointestinal: abdomen soft, NT, ND, BS+ Musculoskeletal: no deformities, strength intact in all 4 Skin: moist, warm, no rashes Neurological: no tremor with outstretched hands, DTR normal in all 4  ASSESSMENT: 1. DM1, uncontrolled, with complications - PN  2. Hypothyroidism  PLAN:  1. Patient with longstanding, uncontrolled, type 1 diabetes, on insulin pump therapy. She switched to a 770 Medtronic insulin pump in 01/2019, which she likes. Her HbA1c was 6.8% at last visit, slightly improved. At that time, she was not entering carbs and bolusing before every meal and we discussed about the importance of doing so. She was also not changing her reservoir and infusion sets frequently now for longer hours only changing them every 7 days and we discussed about the importance of doing this every 3 to 4 days to avoid scarring and fluctuations in  blood sugars. I also recommended temporary basal rates after exercise as she was exercising in the afternoon and dropping her blood sugars at night. I encouraged her to continue to monitor her coughing. She was not calibrating the sensor as frequently enough and she was kicked out of the automatic mode overnight. I encouraged her to calibrate at least 2, but ideally 4 times a day with especially before going to bed at night. We also relaxed her insulin sensitivity factor since she was getting too much insulin with correction. CGM interpretation: -At today's visit, we reviewed together her CGM parameters and tracings.  Her diabetes control worsened significantly since last visit.  Only 50% of her blood sugars are in target range compared to 70% at last visit.  Also, at today's visit 18% of her blood sugars are higher than 250, also a significant increase since last visit with only 7% of the blood sugars were in this range.  Her glucose averages also higher compared to before, at 177 per CGM evaluation.  She is frequently kicked out of the automatic mode due to no calibration and we discussed that she needs to calibrate at least 2 but ideally 4 times a day including at bedtime.  She is very tired at that time as she has an extremely busy schedule as she forgets to do so.  She is being kicked out of the automatic mode throughout the night.  We discussed that she absolutely needs to try to calibrate before going to bed to avoid auto mode exits during the night. -Reviewing the actual patterns, it appears that her sugars decrease overnight even to the 40s between 3-5 AM, and they increase as the day went by, after every meal.  -Upon reviewing her pump downloads, it appears she is not entering many carbs in the pump and not bolusing for meals consistently, in some days, she does not have any boluses for the entire day (12/29/2019), or in 7 days, she only has one bolus a  day.  We discussed that this is not conducive to  good control and she absolutely needs to enter all of the carbs that she eats and bolus for them.  For now, we will focus on compliance with entering these into the pump, rather than changing her bolus parameters. -Also, she is not changing her infusion site frequently now.  In fact, she does this only once a week approximately.  I explained again the reasons why she needs to change it every 3 at maximum 4 days, for example on Sundays and Wednesdays -Her blood sugars occasionally dropped overnight after her cheer practice on Tuesday and Thursdays.  During exercise and right after, her sugars may be higher so for now I did not suggest to change her blood sugar target to 150 during exercise, but we discussed that she may need a temporary basal rate of 80% in the nights following practice. -I advised her to:  Patient Instructions  Please use the following pump settings: - basal rates: 12 am: 1.3 units/h >> 1.15 3 am: 1.3 >> 1.15 6 am: 1.3 (may need to decrease to 1.2) 11 am: 1.1 5 pm: 1.15 9 pm: 1.25 - ICR:   12 am: 9  5 am: 7  - target:  12 am: 120-120 5:30 am: 115-115 9 pm: 120-120 - ISF:  12 am: 45  5:30 am: 45  9 pm: 45 - Insulin on Board: 4h - bolus wizard: on  Calibrate the sensor 2-4 times a day.  You may need a temporary basal rate of 80% for the night after exercise.  Change the site/tubing/insulin every 3-4 days: Sun and Wed  Please do the following approximately before every meal: - Enter carbs (C) - Enter sugars (S) - Start insulin bolus (I)  Please continue Levothyroxine 112 mcg daily.  Take the thyroid hormone every day, with water, at least 30 minutes before breakfast, separated by at least 4 hours from: - acid reflux medications - calcium - iron - multivitamins  Please return in 4 months.  - we checked her HbA1c: 7.2% (higher) - advised to check sugars at different times of the day - 4x a day, rotating check times - advised for yearly eye exams >> she  is UTD - return to clinic in 4 months  2. Hypothyroidism -Patient with history of poorly controlled, longstanding hypothyroidism. She has fluctuating TFTs likely due to incomplete compliance with levothyroxine - latest thyroid labs reviewed with pt >> elevated: Lab Results  Component Value Date   TSH 7.24 (H) 08/31/2019   - she continues on LT4 112 mcg daily -we did not change the dose after last results return, due to missed doses of levothyroxine - pt feels good on this dose. - we discussed about taking the thyroid hormone every day, with water, >30 minutes before breakfast, separated by >4 hours from acid reflux medications, calcium, iron, multivitamins. Pt. is taking it correctly.  She still skips doses but takes them the next day. - will check thyroid tests today: TSH and fT4 - If labs are abnormal, she will need to return for repeat TFTs in 1.5 months  Total time spent for the visit: 40 min, in reviewing her pump downloads, discussing her hypo- and hyper-glycemic episodes, reviewing previous labs and pump settings and developing a plan to avoid hypo- and hyper-glycemia.    Component     Latest Ref Rng & Units 01/10/2020  TSH     0.35 - 4.50 uIU/mL 4.88 (H)  T4,Free(Direct)  0.60 - 1.60 ng/dL 1.01  TSH improved with improved compliance, but still slightly high.  I would suggest to increase the levothyroxine to 125 mcg daily and we will recheck the test at next visit.  Philemon Kingdom, MD PhD [facility] Endocrinology

## 2020-01-11 ENCOUNTER — Other Ambulatory Visit: Payer: Self-pay | Admitting: Internal Medicine

## 2020-01-11 MED ORDER — LEVOTHYROXINE SODIUM 125 MCG PO TABS
125.0000 ug | ORAL_TABLET | Freq: Every day | ORAL | 3 refills | Status: DC
Start: 2020-01-11 — End: 2020-01-11

## 2020-01-11 MED FILL — LEVOTHYROXINE SODIUM 125 MC: 125 | 90 days supply | Qty: 90 | Fill #0

## 2020-02-06 DIAGNOSIS — E1042 Type 1 diabetes mellitus with diabetic polyneuropathy: Secondary | ICD-10-CM | POA: Diagnosis not present

## 2020-02-06 DIAGNOSIS — E1065 Type 1 diabetes mellitus with hyperglycemia: Secondary | ICD-10-CM | POA: Diagnosis not present

## 2020-02-12 ENCOUNTER — Ambulatory Visit (INDEPENDENT_AMBULATORY_CARE_PROVIDER_SITE_OTHER): Payer: 59 | Admitting: Family Medicine

## 2020-02-12 ENCOUNTER — Other Ambulatory Visit: Payer: Self-pay

## 2020-02-12 ENCOUNTER — Encounter: Payer: Self-pay | Admitting: Family Medicine

## 2020-02-12 VITALS — BP 118/70 | HR 68 | Temp 98.7°F | Ht 62.0 in | Wt 147.8 lb

## 2020-02-12 DIAGNOSIS — Z3041 Encounter for surveillance of contraceptive pills: Secondary | ICD-10-CM

## 2020-02-12 DIAGNOSIS — Z0000 Encounter for general adult medical examination without abnormal findings: Secondary | ICD-10-CM

## 2020-02-12 DIAGNOSIS — Z1159 Encounter for screening for other viral diseases: Secondary | ICD-10-CM

## 2020-02-12 DIAGNOSIS — Z23 Encounter for immunization: Secondary | ICD-10-CM

## 2020-02-12 MED FILL — TRI FEMYNOR 28 TABLET: 0.18/0.215/ | 84 days supply | Qty: 84 | Fill #1

## 2020-02-12 MED FILL — ACCU-CHEK GUIDE STRP: 50 days supply | Qty: 200 | Fill #4

## 2020-02-12 NOTE — Progress Notes (Signed)
Patient: Melissa Reese MRN: [id number] DOB: January 10, 2001 PCP: Orland Mustard, MD     Subjective:  Chief Complaint  Patient presents with  . Annual Exam  . Transitions Of Care    HPI: The patient is a 19 y.o. female who presents today for annual exam. She denies any changes to past medical history. There have been no recent hospitalizations. They are not following a well balanced diet and exercise plan. Weight has been stable. No complaints today. Pt is requesting flu shot. She is followed by endocrinology for type I diabetes and hypothyroidism.   No family hx of breast or colon cancer.   She is 20 years of age. Needs a pap smear, but is on her period. Currently sexually active and uses condoms. In monogamous relationship.  Hx of chlamydia 2 years ago.   Immunization History  Administered Date(s) Administered  . H1N1 03/30/2008  . HPV 9-valent 09/06/2018, 11/22/2018  . Influenza Inj Mdck Quad Pf 12/28/2017  . Influenza,inj,Quad PF,6+ Mos 11/22/2012, 12/07/2016  . Influenza,trivalent, recombinat, inj, PF 12/28/2003  . PFIZER SARS-COV-2 Vaccination 11/18/2019, 12/12/2019  . Tdap 09/06/2018    Pap smear: due, will come back for this.    Review of Systems  Constitutional: Negative for chills, fatigue and fever.  HENT: Negative for dental problem, ear pain, hearing loss and trouble swallowing.   Eyes: Negative for visual disturbance.  Respiratory: Negative for cough, chest tightness and shortness of breath.   Cardiovascular: Negative for chest pain, palpitations and leg swelling.  Gastrointestinal: Negative for abdominal pain, blood in stool, diarrhea and nausea.  Endocrine: Negative for cold intolerance, polydipsia, polyphagia and polyuria.  Genitourinary: Negative for dysuria, flank pain, hematuria and pelvic pain.  Musculoskeletal: Negative for arthralgias.  Skin: Negative for rash.  Neurological: Negative for dizziness and headaches.  Psychiatric/Behavioral: Negative for  dysphoric mood and sleep disturbance. The patient is not nervous/anxious.     Allergies Patient has No Known Allergies.  Past Medical History Patient  has a past medical history of Diabetes mellitus without complication (H[facility]) and Thyroid disease.  Surgical History Patient  has no past surgical history on file.  Family History Pateint's family history includes Allergies in her father.  Social History Patient  reports that she has never smoked. She has never used smokeless tobacco. She reports that she does not drink alcohol and does not use drugs.    Objective: Vitals:   02/12/20 1042  BP: 118/70  Pulse: 68  Temp: 98.7 F (37.1 C)  TempSrc: Temporal  SpO2: 94%  Weight: 147 lb 12.8 oz (67 kg)  Height: 5\' 2"  (1.575 m)    Body mass index is 27.03 kg/m.  Physical Exam Vitals reviewed.  Constitutional:      Appearance: Normal appearance. She is well-developed, normal weight and well-nourished.  HENT:     Head: Normocephalic and atraumatic.     Right Ear: Tympanic membrane, ear canal and external ear normal.     Left Ear: Tympanic membrane, ear canal and external ear normal.     Nose: Nose normal.     Mouth/Throat:     Mouth: Oropharynx is clear and moist. Mucous membranes are moist.  Eyes:     Extraocular Movements: Extraocular movements intact and EOM normal.     Conjunctiva/sclera: Conjunctivae normal.     Pupils: Pupils are equal, round, and reactive to light.  Neck:     Thyroid: No thyromegaly.  Cardiovascular:     Rate and Rhythm: Normal rate and regular rhythm.  Pulses: Normal pulses and intact distal pulses.     Heart sounds: Normal heart sounds. No murmur heard.   Pulmonary:     Effort: Pulmonary effort is normal.     Breath sounds: Normal breath sounds.  Abdominal:     General: Abdomen is flat. Bowel sounds are normal. There is no distension.     Palpations: Abdomen is soft.     Tenderness: There is no abdominal tenderness.  Musculoskeletal:      Cervical back: Normal range of motion and neck supple.  Lymphadenopathy:     Cervical: No cervical adenopathy.  Skin:    General: Skin is warm and dry.     Findings: No rash.  Neurological:     General: No focal deficit present.     Mental Status: She is alert and oriented to person, place, and time.     Cranial Nerves: No cranial nerve deficit.     Coordination: Coordination normal.     Deep Tendon Reflexes: Reflexes normal.  Psychiatric:        Mood and Affect: Mood and affect and mood normal.        Behavior: Behavior normal.        Flowsheet Row Office Visit from 02/12/2020 in [facility]  PHQ-2 Total Score 0      Assessment/plan: 1. Annual physical exam Hm reviewed. Will come back for pap smear and STD screen. utd on HIV screen. Discussed need for pnueumovax, she will look into this. Encouraged healthy eating/exercise, safe sex. F/u in 2 weeks for pap smear.  Patient counseling [x]    Nutrition: Stressed importance of moderation in sodium/caffeine intake, saturated fat and cholesterol, caloric balance, sufficient intake of fresh fruits, vegetables, fiber, calcium, iron, and 1 mg of folate supplement per day (for females capable of pregnancy).  [x]    Stressed the importance of regular exercise.   []    Substance Abuse: Discussed cessation/primary prevention of tobacco, alcohol, or other drug use; driving or other dangerous activities under the influence; availability of treatment for abuse.   [x]    Injury prevention: Discussed safety belts, safety helmets, smoke detector, smoking near bedding or upholstery.   [x]    Sexuality: Discussed sexually transmitted diseases, partner selection, use of condoms, avoidance of unintended pregnancy  and contraceptive alternatives.  [x]    Dental health: Discussed importance of regular tooth brushing, flossing, and dental visits.  [x]    Health maintenance and immunizations reviewed. Please refer to Health maintenance  section.    - CBC with Differential/Platelet; Future - COMPLETE METABOLIC PANEL WITH GFR; Future  2. Encounter for hepatitis C screening test for low risk patient  - Hepatitis C antibody; Future  3. Family planning, BCP (birth control pills) maintenance -return for pap smear in 2 weeks. Takes as prescribed. Had a few abnormal periods as she missed her pills for a month, this month has been normal. Continue to use condoms.    This visit occurred during the SARS-CoV-2 public health emergency.  Safety protocols were in place, including screening questions prior to the visit, additional usage of staff PPE, and extensive cleaning of exam room while observing appropriate contact time as indicated for disinfecting solutions.     Return in about 2 weeks (around 02/26/2020) for pap smear .     , MD [place] [facility]  02/12/2020

## 2020-02-12 NOTE — Patient Instructions (Addendum)
-need pnumovax 23 since you are a diabetic.   -flu shot today.   -routine labs. Need to come back for pap smear when NOT on period. ( I put 2 weeks)    Preventive Care 21-21 Years Old, Female Preventive care refers to lifestyle choices and visits with your health care provider that can promote health and wellness. At this stage in your life, you may start seeing a primary care physician instead of a pediatrician. Your health care is now your responsibility. Preventive care for young adults includes:  A yearly physical exam. This is also called an annual wellness visit.  Regular dental and eye exams.  Immunizations.  Screening for certain conditions.  Healthy lifestyle choices, such as diet and exercise. What can I expect for my preventive care visit? Physical exam Your health care provider may check:  Height and weight. These may be used to calculate body mass index (BMI), which is a measurement that tells if you are at a healthy weight.  Heart rate and blood pressure.  Body temperature. Counseling Your health care provider may ask you questions about:  Past medical problems and family medical history.  Alcohol, tobacco, and drug use.  Home and relationship well-being.  Access to firearms.  Emotional well-being.  Diet, exercise, and sleep habits.  Sexual activity and sexual health.  Method of birth control.  Menstrual cycle.  Pregnancy history. What immunizations do I need?  Influenza (flu) vaccine  This is recommended every year. Tetanus, diphtheria, and pertussis (Tdap) vaccine  You may need a Td booster every 10 years. Varicella (chickenpox) vaccine  You may need this vaccine if you have not already been vaccinated. Human papillomavirus (HPV) vaccine  If recommended by your health care provider, you may need three doses over 6 months. Measles, mumps, and rubella (MMR) vaccine  You may need at least one dose of MMR. You may also need a second  dose. Meningococcal conjugate (MenACWY) vaccine  One dose is recommended if you are 38-78 years old and a Market researcher living in a residence hall, or if you have one of several medical conditions. You may also need additional booster doses. Pneumococcal conjugate (PCV13) vaccine  You may need this if you have certain conditions and were not previously vaccinated. Pneumococcal polysaccharide (PPSV23) vaccine  You may need one or two doses if you smoke cigarettes or if you have certain conditions. Hepatitis A vaccine  You may need this if you have certain conditions or if you travel or work in places where you may be exposed to hepatitis A. Hepatitis B vaccine  You may need this if you have certain conditions or if you travel or work in places where you may be exposed to hepatitis B. Haemophilus influenzae type b (Hib) vaccine  You may need this if you have certain risk factors. You may receive vaccines as individual doses or as more than one vaccine together in one shot (combination vaccines). Talk with your health care provider about the risks and benefits of combination vaccines. What tests do I need? Blood tests  Lipid and cholesterol levels. These may be checked every 5 years starting at age 21.  Hepatitis C test.  Hepatitis B test. Screening  Pelvic exam and Pap test. This may be done every 3 years starting at age 53.  Sexually transmitted disease (STD) testing, if you are at risk.  BRCA-related cancer screening. This may be done if you have a family history of breast, ovarian, tubal, or peritoneal  cancers. Other tests  Tuberculosis skin test.  Vision and hearing tests.  Skin exam.  Breast exam. Follow these instructions at home: Eating and drinking   Eat a diet that includes fresh fruits and vegetables, whole grains, lean protein, and low-fat dairy products.  Drink enough fluid to keep your urine pale yellow.  Do not drink alcohol if: ? Your  health care provider tells you not to drink. ? You are pregnant, may be pregnant, or are planning to become pregnant. ? You are under the legal drinking age. In the [place] the legal drinking age is 59.  If you drink alcohol: ? Limit how much you have to 0-1 drink a day. ? Be aware of how much alcohol is in your drink. In the [place] one drink equals one 12 oz bottle of beer (355 mL), one 5 oz glass of wine (148 mL), or one 1 oz glass of hard liquor (44 mL). Lifestyle  Take daily care of your teeth and gums.  Stay active. Exercise at least 30 minutes 5 or more days of the week.  Do not use any products that contain nicotine or tobacco, such as cigarettes, e-cigarettes, and chewing tobacco. If you need help quitting, ask your health care provider.  Do not use drugs.  If you are sexually active, practice safe sex. Use a condom or other form of birth control (contraception) in order to prevent pregnancy and STIs (sexually transmitted infections). If you plan to become pregnant, see your health care provider for a pre-conception visit.  Find healthy ways to cope with stress, such as: ? Meditation, yoga, or listening to music. ? Journaling. ? Talking to a trusted person. ? Spending time with friends and family. Safety  Always wear your seat belt while driving or riding in a vehicle.  Do not drive if you have been drinking alcohol. Do not ride with someone who has been drinking.  Do not drive when you are tired or distracted. Do not text while driving.  Wear a helmet and other protective equipment during sports activities.  If you have firearms in your house, make sure you follow all gun safety procedures.  Seek help if you have been bullied, physically abused, or sexually abused.  Use the Internet responsibly to avoid dangers such as online bullying and online sex predators. What's next?  Go to your health care provider once a year for a well check visit.  Ask your health care  provider how often you should have your eyes and teeth checked.  Stay up to date on all vaccines. This information is not intended to replace advice given to you by your health care provider. Make sure you discuss any questions you have with your health care provider. Document Revised: 02/10/2018 Document Reviewed: 02/10/2018 Elsevier Patient Education  2020 [employer].

## 2020-02-21 DIAGNOSIS — E1042 Type 1 diabetes mellitus with diabetic polyneuropathy: Secondary | ICD-10-CM | POA: Diagnosis not present

## 2020-02-21 DIAGNOSIS — E1065 Type 1 diabetes mellitus with hyperglycemia: Secondary | ICD-10-CM | POA: Diagnosis not present

## 2020-02-28 ENCOUNTER — Ambulatory Visit: Payer: 59 | Admitting: Family Medicine

## 2020-02-28 ENCOUNTER — Other Ambulatory Visit (HOSPITAL_COMMUNITY)
Admission: RE | Admit: 2020-02-28 | Discharge: 2020-02-28 | Disposition: A | Discharge status: Home / Self Care | Payer: 59 | Source: Ambulatory Visit | Attending: Family Medicine | Admitting: Family Medicine

## 2020-02-28 ENCOUNTER — Other Ambulatory Visit: Payer: Self-pay

## 2020-02-28 ENCOUNTER — Encounter: Payer: Self-pay | Admitting: Family Medicine

## 2020-02-28 VITALS — BP 128/86 | HR 81 | Temp 98.1°F | Ht 62.0 in | Wt 144.8 lb

## 2020-02-28 DIAGNOSIS — Z01419 Encounter for gynecological examination (general) (routine) without abnormal findings: Secondary | ICD-10-CM

## 2020-02-28 NOTE — Patient Instructions (Signed)
Pap smear today and std screen. If normal you can go 3 years for repeat.  Good seeing you! Happy new year! Dr. Artis Flock

## 2020-02-28 NOTE — Progress Notes (Signed)
SUBJECTIVE:  19 y.o. female for annual routine Pap and checkup.  Menarche around age 36 years. Normal periods every 28 days. Typically last 7 days. Her period was normal flow. She was put on OCP when she was 16 years for her acne. Her periods are now only 4-5 days and lighter. Takes her OCP as prescribed. No hx of auras, blood clot or migraines. This is her first pap smear. She had a STD 2 years ago and had chlamydia. None since that time. She is sexually active with her boyfriend. They use condoms, but have had times they do not. No pain with sex, no urinary complaints and no vaginal discharge.   No family hx of breast cancer or colon cancer.   Current Outpatient Medications  Medication Sig Dispense Refill  . FIASP 100 UNIT/ML SOLN Use up to 70 units per day in the pump 63 mL 3  . Glucagon (BAQSIMI ONE PACK) 3 MG/DOSE POWD Place 3 mg into the nose once as needed for up to 1 dose. Please replace with Glucagon im 1 kit if the intranasal form not covered 1 each 11  . glucose blood (ACCU-CHEK GUIDE) test strip For use with insulin pump to check blood sugar 4 times a day. 400 each 12  . insulin detemir (LEVEMIR) 100 UNIT/ML FlexPen Inject into the skin.    Marland Kitchen levothyroxine (SYNTHROID) 125 MCG tablet Take 1 tablet (125 mcg total) by mouth daily. 90 tablet 3  . TRI FEMYNOR 0.18/0.215/0.25 MG-35 MCG tablet TAKE 1 TABLET BY MOUTH DAILY. 84 tablet PRN   No current facility-administered medications for this visit.   Allergies: Patient has no known allergies.  No LMP recorded.  ROS:  Feeling well. No dyspnea or chest pain on exertion.  No abdominal pain, change in bowel habits, black or bloody stools.  No urinary tract symptoms. GYN ROS: normal menses, no abnormal bleeding, pelvic pain or discharge, no breast pain or new or enlarging lumps on self exam. No neurological complaints. Chaperone present OBJECTIVE:  The patient appears well, alert, oriented x 3, in no distress. BP 128/86   Pulse 81   Temp  98.1 F (36.7 C) (Temporal)   Ht '5\' 2"'  (1.575 m)   Wt 144 lb 12.8 oz (65.7 kg)   SpO2 97%   BMI 26.48 kg/m  ENT normal.  Neck supple. No adenopathy or thyromegaly. PERLA. Lungs are clear, good air entry, no wheezes, rhonchi or rales. S1 and S2 normal, no murmurs, regular rate and rhythm. Abdomen soft without tenderness, guarding, mass or organomegaly. Extremities show no edema, normal peripheral pulses. Neurological is normal, no focal findings.  BREAST EXAM: breasts appear normal, no suspicious masses, no skin or nipple changes or axillary nodes  PELVIC EXAM: normal external genitalia, vulva, vagina, cervix, uterus and adnexa  ASSESSMENT:  well woman no contraindication to continue use of oral contraceptives  PLAN:  pap smear return annually or prn  This visit occurred during the SARS-CoV-2 public health emergency.  Safety protocols were in place, including screening questions prior to the visit, additional usage of staff PPE, and extensive cleaning of exam room while observing appropriate contact time as indicated for disinfecting solutions.   Orma Flaming, MD [place]

## 2020-02-28 NOTE — Progress Notes (Deleted)
Subjective:    Melissa Reese is a 19 y.o. female and is here for a comprehensive physical exam.  Pertinent Gynecological History: No LMP recorded. Sexually active: {CHL AMB SEXUALLY ACTIVE:210950101} Menses: {menses:16152} Bleeding: {uterine bleeding:32112} Contraception: {contraception:5051} DES exposure: {denies/unknown:32108} Blood transfusions: {none:33079} Sexually transmitted diseases: {std risk:32110} Previous GYN Procedures: {previous procedures:3041388}  Last mammogram: {normal/abnormal***:32111} Date: *** Last pap: {normal/abnormal***:32111} Date: *** HPV vaccines:   OB History   No obstetric history on file.     Health Maintenance Due  Topic Date Due  . PNEUMOCOCCAL POLYSACCHARIDE VACCINE AGE 70-64 HIGH RISK  Never done  . FOOT EXAM  09/17/2017  . OPHTHALMOLOGY EXAM  08/18/2018  . PAP-Cervical Cytology Screening  Never done  . PAP SMEAR-Modifier  Never done    PMHx, SurgHx, SocialHx, Medications, and Allergies were reviewed in the Visit Navigator and updated as appropriate.   Past Medical History:  Diagnosis Date  . Diabetes mellitus without complication (HCC)   . Thyroid disease    No past surgical history on file. Family History  Problem Relation Age of Onset  . Allergies Father    Social History   Tobacco Use  . Smoking status: Never Smoker  . Smokeless tobacco: Never Used  Substance Use Topics  . Alcohol use: No  . Drug use: No    Review of Systems:   Review of Systems  Gastrointestinal: Negative for abdominal pain and nausea.  Genitourinary: Negative for flank pain and urgency.    Pertinent items are noted in the HPI. Otherwise, ROS is negative.  Objective:   There were no vitals taken for this visit.  Wt Readings from Last 3 Encounters:  02/12/20 147 lb 12.8 oz (67 kg)  01/10/20 145 lb (65.8 kg)  08/31/19 147 lb (66.7 kg)     Ht Readings from Last 3 Encounters:  02/12/20 5\' 2"  (1.575 m)  01/10/20 5\' 2"  (1.575 m)   08/31/19 5' 2.25" (1.581 m)    General appearance: alert, cooperative and appears stated age. Head: normocephalic, without obvious abnormality, atraumatic. Neck: no adenopathy, supple, symmetrical, trachea midline; thyroid not enlarged, symmetric, no tenderness/mass/nodules. Lungs: clear to auscultation bilaterally. Breasts: inspection negative, no nipple retraction or dimpling, no nipple discharge or bleeding, no axillary or supraclavicular adenopathy, normal to palpation without dominant masses. Heart: regular rate and rhythm Abdomen: soft, non-tender; no masses,  no organomegaly. Extremities: extremities normal, atraumatic, no cyanosis or edema. Skin: skin color, texture, turgor normal, no rashes or lesions. Lymph: cervical, supraclavicular, and axillary nodes normal; no abnormal inguinal nodes palpated. Neurologic: grossly normal.  Pelvic:  External genitalia: no lesions.              Urethra: normal appearing urethra with no masses, tenderness or lesions.              Bartholins and Skenes: normal.               Vagina: normal appearing vagina with normal color and discharge, no lesions.              Cervix: normal appearance.              Pap and high risk HPV testing done: {yes no:314532}.        Bimanual Exam:    Uterus: uterus is normal size, shape, consistency and nontender.  Adnexa: normal adnexa in size, nontender and no masses.                                      Rectovaginal: {yes no:314532}.                                      Confirms above.                                      Anus: normal sphincter tone, no lesions.   Assessment/Plan:   There are no diagnoses linked to this encounter.  Patient Counseling: [x]    Nutrition: Stressed importance of moderation in sodium/caffeine intake, saturated fat and cholesterol, caloric balance, sufficient intake of fresh fruits, vegetables, fiber, calcium, iron, and 1 mg of folate supplement  per day (for females capable of pregnancy).  [x]    Stressed the importance of regular exercise.   [x]    Substance Abuse: Discussed cessation/primary prevention of tobacco, alcohol, or other drug use; driving or other dangerous activities under the influence; availability of treatment for abuse.   [x]    Injury prevention: Discussed safety belts, safety helmets, smoke detector, smoking near bedding or upholstery.   [x]    Sexuality: Discussed sexually transmitted diseases, partner selection, use of condoms, avoidance of unintended pregnancy  and contraceptive alternatives.  [x]    Dental health: Discussed importance of regular tooth brushing, flossing, and dental visits.  [x]    Health maintenance and immunizations reviewed. Please refer to Health maintenance section.   , MD [place] [facility] [place]  *** Take ownership of note and .scribe

## 2020-03-04 LAB — CYTOLOGY - PAP
Chlamydia: NEGATIVE
Comment: NEGATIVE
Comment: NEGATIVE
Comment: NORMAL
Diagnosis: NEGATIVE
Diagnosis: REACTIVE
Neisseria Gonorrhea: NEGATIVE
Trichomonas: NEGATIVE

## 2020-03-08 DIAGNOSIS — Z20822 Contact with and (suspected) exposure to covid-19: Secondary | ICD-10-CM | POA: Diagnosis not present

## 2020-03-22 ENCOUNTER — Telehealth: Payer: Self-pay

## 2020-03-22 ENCOUNTER — Encounter: Payer: Self-pay | Admitting: Family Medicine

## 2020-03-22 NOTE — Telephone Encounter (Signed)
Pt would like to pick up her immunization records for school

## 2020-03-24 DIAGNOSIS — E1042 Type 1 diabetes mellitus with diabetic polyneuropathy: Secondary | ICD-10-CM | POA: Diagnosis not present

## 2020-03-24 DIAGNOSIS — E1065 Type 1 diabetes mellitus with hyperglycemia: Secondary | ICD-10-CM | POA: Diagnosis not present

## 2020-03-24 DIAGNOSIS — E1040 Type 1 diabetes mellitus with diabetic neuropathy, unspecified: Secondary | ICD-10-CM | POA: Diagnosis not present

## 2020-03-25 NOTE — Telephone Encounter (Signed)
Immunizations are at the front for pick up.

## 2020-03-29 DIAGNOSIS — E1065 Type 1 diabetes mellitus with hyperglycemia: Secondary | ICD-10-CM | POA: Diagnosis not present

## 2020-03-29 DIAGNOSIS — E1040 Type 1 diabetes mellitus with diabetic neuropathy, unspecified: Secondary | ICD-10-CM | POA: Diagnosis not present

## 2020-03-29 DIAGNOSIS — E1042 Type 1 diabetes mellitus with diabetic polyneuropathy: Secondary | ICD-10-CM | POA: Diagnosis not present

## 2020-04-03 MED FILL — LEVOTHYROXINE SODIUM 125 MC: 125 | 90 days supply | Qty: 90 | Fill #1

## 2020-04-03 MED FILL — FIASP 100 UNIT/ML SOLN: 100 | 84 days supply | Qty: 60 | Fill #1

## 2020-04-29 DIAGNOSIS — E1040 Type 1 diabetes mellitus with diabetic neuropathy, unspecified: Secondary | ICD-10-CM | POA: Diagnosis not present

## 2020-04-29 DIAGNOSIS — E1065 Type 1 diabetes mellitus with hyperglycemia: Secondary | ICD-10-CM | POA: Diagnosis not present

## 2020-04-29 DIAGNOSIS — E1042 Type 1 diabetes mellitus with diabetic polyneuropathy: Secondary | ICD-10-CM | POA: Diagnosis not present

## 2020-05-10 ENCOUNTER — Encounter: Payer: Self-pay | Admitting: Internal Medicine

## 2020-05-10 ENCOUNTER — Other Ambulatory Visit: Payer: Self-pay

## 2020-05-10 ENCOUNTER — Other Ambulatory Visit: Payer: Self-pay | Admitting: Internal Medicine

## 2020-05-10 ENCOUNTER — Ambulatory Visit: Payer: 59 | Admitting: Internal Medicine

## 2020-05-10 VITALS — BP 120/82 | HR 81 | Ht 62.0 in | Wt 148.8 lb

## 2020-05-10 DIAGNOSIS — IMO0002 Reserved for concepts with insufficient information to code with codable children: Secondary | ICD-10-CM

## 2020-05-10 DIAGNOSIS — E1040 Type 1 diabetes mellitus with diabetic neuropathy, unspecified: Secondary | ICD-10-CM

## 2020-05-10 DIAGNOSIS — E039 Hypothyroidism, unspecified: Secondary | ICD-10-CM | POA: Diagnosis not present

## 2020-05-10 DIAGNOSIS — E1065 Type 1 diabetes mellitus with hyperglycemia: Secondary | ICD-10-CM | POA: Diagnosis not present

## 2020-05-10 LAB — POCT GLYCOSYLATED HEMOGLOBIN (HGB A1C): Hemoglobin A1C: 7.5 % — AB (ref 4.0–5.6)

## 2020-05-10 LAB — TSH: TSH: 25.79 u[IU]/mL — ABNORMAL HIGH (ref 0.35–4.50)

## 2020-05-10 LAB — T4, FREE: Free T4: 0.87 ng/dL (ref 0.60–1.60)

## 2020-05-10 MED ORDER — BAQSIMI ONE PACK 3 MG/DOSE NA POWD: 3.0000 mg | Freq: Once | NASAL | 11 refills | Status: DC | PRN

## 2020-05-10 MED FILL — BAQSIMI ONE PACK 3 MG/DOSE: 3 | 1 days supply | Qty: 1 | Fill #0

## 2020-05-10 MED FILL — TRI FEMYNOR 28 TABLET: 0.18/0.215/ | 84 days supply | Qty: 84 | Fill #2

## 2020-05-10 NOTE — Patient Instructions (Addendum)
Please use the following pump settings: - basal rates: 12 am: 1.15 3 am: 1.15 6 am: 1.3 (may need to decrease to 1.2) 11 am: 1.1 5 pm: 1.15 9 pm: 1.25 - ICR:   12 am: 9  5 am: 7  - target:  12 am: 120-120 5:30 am: 115-115 9 pm: 120-120 - ISF:  12 am: 45  5:30 am: 45  9 pm: 45 - Insulin on Board: 4h - bolus wizard: on  Calibrate the sensor 2-4 times a day.  You may need a temporary basal rate of 80% for the night after exercise.  Change the site/tubing/insulin every 3-4 days: Sun and Wed  Please do the following approximately before every meal: - Enter carbs (C) - Enter sugars (S) - Start insulin bolus (I)  Please continue Levothyroxine 112 mcg daily.  Take the thyroid hormone every day, with water, at least 30 minutes before breakfast, separated by at least 4 hours from: - acid reflux medications - calcium - iron - multivitamins  Please return in 4 months.

## 2020-05-10 NOTE — Progress Notes (Signed)
Patient ID: Melissa Reese, female   DOB: 12-15-2000, 20 y.o.   MRN: [id number]   This visit occurred during the SARS-CoV-2 public health emergency.  Safety protocols were in place, including screening questions prior to the visit, additional usage of staff PPE, and extensive cleaning of exam room while observing appropriate contact time as indicated for disinfecting solutions.   HPI: Melissa Reese is a 20 y.o.-year-old female, initially referred by her PCP, Dr. Juleen China, returning for follow-up for DM1  Dx 2003, uncontrolled, with complications (PN). Prev. seen at [facility]. Last visit with me 4 months ago..  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  Insulin pump: - Started at 20 years old - OneTouch ping - started 01/2015 - Medtronic 670 G insulin pump + guardian CGM-started 09/2016 (band aid to keep the CGM in place: Simplatch). She had a crack in her pump -received another pump 01/2018.   - Most recent: Medtronic 770 G started 01/2019.  Meter: Accu-Chek guide link  CGM: -Medtronic Guardian  Insulin: -FiAsp ([place] approved 01/04/2020)  DM1:  Reviewed HbA1c levels: Lab Results  Component Value Date   HGBA1C 7.2 (A) 01/10/2020   HGBA1C 6.8 (A) 08/31/2019   HGBA1C 6.9 (A) 03/24/2019   HGBA1C 6.7 (A) 11/22/2018   HGBA1C 8.0 (A) 07/19/2018   HGBA1C 8.0 (A) 03/14/2018   HGBA1C 8.7 (H) 08/24/2017   HGBA1C 8.9 04/09/2017   HGBA1C 8.4 12/22/2016   HGBA1C 9.7 (H) 09/07/2016  Prev.  02/20/2016: HbA1c 7.2% 11/21/2015: HbA1c 7.4% 08/21/2015: HbA1c 7.7% 04/24/2015: HbA1c 7.8%  Pump settings: - basal rates: 12 am: 1.3 units/h >> 1.15 3 am: 1.3 >> 1.15 6 am: 1.3 (may need to decrease to 1.2) 11 am: 1.1 5 pm: 1.15 9 pm: 1.25 - ICR:   12 am: 9  5 am: 7  - target:  12 am: 120-120 5:30 am: 115-115 9 pm: 120-120 - ISF:  12 am: 45  5:30 am: 45  9 pm: 45 - Insulin on Board: 4h - bolus wizard:  on  Calibrate the sensor 2-4 times a day. You may need a temporary basal rate of 80% for the night after exercise.  TDD from basal insulin: 70% (23 units) >> 69% >> 68% TDD from bolus insulin: 30% (10 units) >> 31% >> 32% TDD 39 to 60 units units. - extended bolusing: not using - changes infusion site: Every every 4 to 5 days >> every 6-7 days >> still every 3 days.   She checks her sugars more than 4 times a day with her CGM  CGM parameters:  - Average from CGM:  161+/-57 >> 151 +/-58 >> 177+/-78 >> 176+/-75 - Average from manual BG checks: 120 +/-53 >> 173+/-82 >> 174 +/- 102 >> 207+/-131  Time in range:  - very low (40-50): 1% >> 1% >> 1% >> 2% - low (50-70): 1% >> 4% >> 6% >> 3% - normal range (70-180): 63% >> 70% >> 50% >> 53% - high sugars (180-250): 26% >> 20% >> 25% >> 27% - very high sugars (250-400):  3% >> 7% >> 18% >> 50%  - in auto mode: 5% >> 74 % >> 57% >> 59% >> 62% of the time - in manual mode: 95% >> 26 % >> 43% >> 41% >> 38% of the time  She calibrates the sensor 1.6 times a day     Previously:   Previously:  Lowest sugar:  LO>> .Marland Kitchen.50s >> 44 mostly in am >> LO. She has hypoglycemia awareness at 47s.  She had previous visits to the ED for hypoglycemia years ago, not recently. Highest sugar: >400 ... >> 300s >> 400s. No previous DKA admissions.  No CKD, last BUN/creatinine:  Lab Results  Component Value Date   BUN 9 08/31/2019   BUN 7 07/19/2018   CREATININE 1.03 08/31/2019   CREATININE 0.83 07/19/2018  Not on ACE inhibitor/ARB. + HL; last set of lipids: Lab Results  Component Value Date   CHOL 166 08/31/2019   HDL 54.30 08/31/2019   LDLCALC 93 08/31/2019   TRIG 93.0 08/31/2019   CHOLHDL 3 08/31/2019  08/21/2015: 145/148/45/76 Not on a statin. - latest eye exam: 03/2019: No DR;  - + numbness and tingling in her feet.   Hypothyroidism:  Pt is on levothyroxine 125 mcg daily (increased at last visit), taken: - in am - fasting - at  least 30 min from b'fast - no calcium - no iron - no multivitamins - no PPIs - not on Biotin  Reviewed her TFTs: Lab Results  Component Value Date   TSH 4.88 (H) 01/10/2020   TSH 7.24 (H) 08/31/2019   TSH 0.27 (L) 03/24/2019   TSH 0.02 (L) 01/03/2019   TSH 0.01 (L) 11/22/2018   TSH 0.47 07/19/2018   TSH 5.29 (H) 03/14/2018   TSH 4.16 10/11/2017   TSH 21.48 (H) 08/24/2017   TSH 0.813 04/09/2017  11/21/2015: TSH 6.397 08/21/2015: TSH 41.268 04/24/2015: TSH 14.788 08/16/2014: TSH 0.407  Pt denies: - feeling nodules in neck - hoarseness - dysphagia - choking - SOB with lying down  She started college in August 2018 >> May 2022. Sociology major.   ROS: Constitutional: no weight gain/no weight loss, no fatigue, no subjective hyperthermia, no subjective hypothermia Eyes: no blurry vision, no xerophthalmia ENT: no sore throat, + see HPI Cardiovascular: no CP/no SOB/no palpitations/no leg swelling Respiratory: no cough/no SOB/no wheezing Gastrointestinal: no N/no V/no D/no C/no acid reflux Musculoskeletal: no muscle aches/no joint aches Skin: no rashes, no hair loss Neurological: no tremors/+ numbness/+ tingling/no dizziness  I reviewed pt's medications, allergies, PMH, social hx, family hx, and changes were documented in the history of present illness. Otherwise, unchanged from my initial visit note.  Past Medical History:  Diagnosis Date  . Diabetes mellitus without complication ([place])   . Thyroid disease     Social History   Social History  . Marital status: Single    Spouse name: N/A  . Number of children: 0   Occupational History  . student   Social History Main Topics  . Smoking status: Never Smoker  . Smokeless tobacco: Never Used  . Alcohol use No  . Drug use: No   Current Outpatient Medications on File Prior to Visit  Medication Sig Dispense Refill  . FIASP 100 UNIT/ML SOLN Use up to 70 units per day in the pump 63 mL 3  . Glucagon (BAQSIMI ONE  PACK) 3 MG/DOSE POWD Place 3 mg into the nose once as needed for up to 1 dose. Please replace with Glucagon im 1 kit if the intranasal form not covered 1 each 11  . glucose blood (ACCU-CHEK GUIDE) test strip For use with insulin pump to check blood sugar 4 times a day. 400 each 12  . insulin detemir (LEVEMIR) 100 UNIT/ML FlexPen Inject into the skin.    Marland Kitchen levothyroxine (SYNTHROID) 125 MCG tablet Take 1 tablet (125 mcg total) by  mouth daily. 90 tablet 3  . TRI FEMYNOR 0.18/0.215/0.25 MG-35 MCG tablet TAKE 1 TABLET BY MOUTH DAILY. 84 tablet PRN   No current facility-administered medications on file prior to visit.   No Known Allergies Family History  Problem Relation Age of Onset  . Allergies Father    PE: There were no vitals taken for this visit. Wt Readings from Last 3 Encounters:  02/28/20 144 lb 12.8 oz (65.7 kg)  02/12/20 147 lb 12.8 oz (67 kg)  01/10/20 145 lb (65.8 kg)   Constitutional: normal weight, in NAD Eyes: PERRLA, EOMI, no exophthalmos ENT: moist mucous membranes, no thyromegaly, no cervical lymphadenopathy Cardiovascular: RRR, No MRG Respiratory: CTA B Gastrointestinal: abdomen soft, NT, ND, BS+ Musculoskeletal: no deformities, strength intact in all 4 Skin: moist, warm, no rashes Neurological: no tremor with outstretched hands, DTR normal in all 4   ASSESSMENT: 1. DM1, uncontrolled, with complications - PN  2. Hypothyroidism  PLAN:  1. Patient with longstanding, uncontrolled, type 1 diabetes, on insulin pump therapy.  She switched to a Medtronic 770 G insulin pump in 01/2019, and she likes this pump.  At last visit, HbA1c was slightly higher, at 7.2%, previously 6.8%, but she was very busy and was also not staying in the automatic mode enough.  Only 50% of her blood sugars were in range at that time.  She was not calibrating the CGM enough and we discussed about doing it at least twice a day but ideally 4 times a day especially before going to bed at night.   Sugars were decreasing overnight even to the 40s at that time, and they increase as the day went by, after meals.  Therefore, we decreased her basal rates during the night and we discussed about entering the entire amount of carbs for all meals (she was not entering these correctly) before changing insulin to carb ratios.  She has occasional days before last visit in which she was not entering any carbs.  We discussed that this is not conducive to good control.  She was also dropping her sugars overnight after cheer practice on Tuesday and Thursdays and we discussed about the fact that she may need a temporary basal rate of 80% in the night following practice.  I did not suggest to change the blood sugar target during exercise as sugars were occasionally higher right after breakfast CGM interpretation: -At today's visit, we reviewed her CGM downloads: It appears that only 53% of values are in target range (goal >70%), while 42% are higher than 180 (goal <25%), and 5% are lower than 70 (goal <4%).  The calculated average blood sugar is 176. -Reviewing the CGM trends, the sugars are very fluctuating, more than before.  Consistent trends are increases in blood sugars after breakfast and then after dinner.  Upon questioning, she was eating on the go until her cheer season ended, but now she is preparing to have more consistent, regular, meals and is considering a lower carb diet.  We discussed about decreasing the carbs in processed foods, but not the whole foods.  She is interested in a referral to nutrition, which I placed today.  She would definitely benefit from help with carb counting.  She still does not enter enough carbs into the pump.  She also has days in which she does not enter carbs at all.  We discussed that she absolutely needs to enter all the carbs that she is eating and covering them with insulin. -Most of the steep  hyperglycemic excursions after meals are due to bolusing too late for the meal.   She usually boluses when the sugars are teaching and not before the meal.  I again strongly advised her to bolus at the start of the meal if the sugars are normal or even after 10 minutes before the meal if the sugars are high.  Today she bolused in advance but her blood sugars were normal and she dropped her sugars right after the meal and had to take orange juice and crackers. -Her blood sugars are occasionally lower in the morning and she needs to treat these with juice.  She does not have an unexpired glucagon at home so I sent one to the pharmacy.  Now that the cheering season is over, I doubt that she will continue to have low blood sugars in the morning but this is the reason why I would not increase her basal rate overnight although on the 2-week CGM profile, blood sugars appear to be slightly higher than target.  I did advise her that if she continues to see low blood sugars in the morning, she may even need to decrease the basal rate in the morning, from 6 AM to 11 AM (see below). -I advised her to:  Patient Instructions  Please use the following pump settings: - basal rates: 12 am: 1.15 3 am: 1.15 6 am: 1.3 (may need to decrease to 1.2) 11 am: 1.1 5 pm: 1.15 9 pm: 1.25 - ICR:   12 am: 9  5 am: 7  - target:  12 am: 120-120 5:30 am: 115-115 9 pm: 120-120 - ISF:  12 am: 45  5:30 am: 45  9 pm: 45 - Insulin on Board: 4h - bolus wizard: on  Calibrate the sensor 2-4 times a day.  You may need a temporary basal rate of 80% for the night after exercise.  Change the site/tubing/insulin every 3-4 days: Sun and Wed  Please do the following approximately before every meal: - Enter carbs (C) - Enter sugars (S) - Start insulin bolus (I)  Please continue Levothyroxine 112 mcg daily.  Take the thyroid hormone every day, with water, at least 30 minutes before breakfast, separated by at least 4 hours from: - acid reflux medications - calcium - iron - multivitamins  Please return  in 4 months.  - we checked her HbA1c: 7.5% (higher) - advised to check sugars at different times of the day - 4x a day, rotating check times - advised for yearly eye exams >> she is not UTD - return to clinic in 4 months  2. Hypothyroidism -Patient with history of poorly controlled, longstanding hypothyroidism. She has fluctuating TFTs likely due to incomplete compliance with levothyroxine - latest thyroid labs reviewed with pt >> TSH was still elevated: Lab Results  Component Value Date   TSH 4.88 (H) 01/10/2020   - she continues on LT4 125 mcg daily - pt feels good on this dose. - we discussed about taking the thyroid hormone every day, with water, >30 minutes before breakfast, separated by >4 hours from acid reflux medications, calcium, iron, multivitamins. Pt. is taking it correctly, however, she sometimes has low blood sugars in the morning and has to drink juice before taking her levothyroxine - will check thyroid tests today: TSH and fT4 - If labs are abnormal, she will need to return for repeat TFTs in 1.5 months  Total time spent for the visit: 40 min, in reviewing her pump downloads, discussing her hypo-  and hyper-glycemic episodes, reviewing previous labs and pump settings and developing a plan to avoid hypo- and hyper-glycemia.   Office Visit on 05/10/2020  Component Date Value Ref Range Status  . Hemoglobin A1C 05/10/2020 7.5* 4.0 - 5.6 % Final  . Free T4 05/10/2020 0.87  0.60 - 1.60 ng/dL Final   Comment: Specimens from patients who are undergoing biotin therapy and /or ingesting biotin supplements may contain high levels of biotin.  The higher biotin concentration in these specimens interferes with this Free T4 assay.  Specimens that contain high levels  of biotin may cause false high results for this Free T4 assay.  Please interpret results in light of the total clinical presentation of the patient.    Marland Kitchen TSH 05/10/2020 25.79* 0.35 - 4.50 uIU/mL Final   Message  sent: Dear Lilia Pro, The thyroid tests are now significantly abnormal.  The TSH is quite high.  From what you are describing, I think this is related to not taking the levothyroxine on empty stomach.  Please try your best to take it before drinking any juice to bring the sugars up.  Another option would be to raise her blood sugars with glucose tablets but first taking the levothyroxine.  Let us recheck the tests in 1.5 months. Please call our main office number [phone]) to schedule a lab appointment.  Sincerely, Philemon Kingdom MD Philemon Kingdom, MD PhD [facility] Endocrinology

## 2020-06-13 DIAGNOSIS — E1065 Type 1 diabetes mellitus with hyperglycemia: Secondary | ICD-10-CM | POA: Diagnosis not present

## 2020-06-13 DIAGNOSIS — E1042 Type 1 diabetes mellitus with diabetic polyneuropathy: Secondary | ICD-10-CM | POA: Diagnosis not present

## 2020-06-13 DIAGNOSIS — E1040 Type 1 diabetes mellitus with diabetic neuropathy, unspecified: Secondary | ICD-10-CM | POA: Diagnosis not present

## 2020-06-23 DIAGNOSIS — E1040 Type 1 diabetes mellitus with diabetic neuropathy, unspecified: Secondary | ICD-10-CM | POA: Diagnosis not present

## 2020-06-23 DIAGNOSIS — E1042 Type 1 diabetes mellitus with diabetic polyneuropathy: Secondary | ICD-10-CM | POA: Diagnosis not present

## 2020-06-23 DIAGNOSIS — E1065 Type 1 diabetes mellitus with hyperglycemia: Secondary | ICD-10-CM | POA: Diagnosis not present

## 2020-07-12 DIAGNOSIS — H5213 Myopia, bilateral: Secondary | ICD-10-CM | POA: Diagnosis not present

## 2020-07-19 DIAGNOSIS — E1040 Type 1 diabetes mellitus with diabetic neuropathy, unspecified: Secondary | ICD-10-CM | POA: Diagnosis not present

## 2020-07-19 DIAGNOSIS — E1065 Type 1 diabetes mellitus with hyperglycemia: Secondary | ICD-10-CM | POA: Diagnosis not present

## 2020-07-19 DIAGNOSIS — E1042 Type 1 diabetes mellitus with diabetic polyneuropathy: Secondary | ICD-10-CM | POA: Diagnosis not present

## 2020-07-30 ENCOUNTER — Telehealth (INDEPENDENT_AMBULATORY_CARE_PROVIDER_SITE_OTHER): Payer: 59 | Admitting: Family Medicine

## 2020-07-30 ENCOUNTER — Other Ambulatory Visit (HOSPITAL_COMMUNITY): Payer: Self-pay

## 2020-07-30 ENCOUNTER — Encounter: Payer: Self-pay | Admitting: Family Medicine

## 2020-07-30 VITALS — Temp 98.8°F

## 2020-07-30 DIAGNOSIS — U071 COVID-19: Secondary | ICD-10-CM

## 2020-07-30 MED ORDER — BENZONATATE 100 MG PO CAPS: 100.0000 mg | ORAL_CAPSULE | Freq: Three times a day (TID) | ORAL | 0 refills | Status: DC | PRN

## 2020-07-30 MED ORDER — MOLNUPIRAVIR EUA 200MG CAPSULE: 4.0000 | ORAL_CAPSULE | Freq: Two times a day (BID) | ORAL | 0 refills | Status: AC

## 2020-07-30 MED FILL — Norgestimate-Eth Estrad Tab 0.18-35/0.215-35/0.25-35 MG-MCG: ORAL | 84 days supply | Qty: 84 | Fill #0 | Status: AC

## 2020-07-30 MED FILL — Insulin Aspart (with Niacinamide) Inj 100 Unit/ML: SUBCUTANEOUS | 85 days supply | Qty: 60 | Fill #0 | Status: AC

## 2020-07-30 MED FILL — Glucagon Nasal Powder 3 MG/DOSE: NASAL | 28 days supply | Qty: 1 | Fill #0 | Status: AC

## 2020-07-30 MED FILL — Levothyroxine Sodium Tab 125 MCG: ORAL | 90 days supply | Qty: 90 | Fill #0 | Status: AC

## 2020-07-30 NOTE — Progress Notes (Signed)
Virtual Visit via Video Note  I connected with Melissa Reese  on 07/30/20 at  3:00 PM EDT by a video enabled telemedicine application and verified that I am speaking with the correct person using two identifiers.  Location patient: home, [place] Location provider:work or home office Persons participating in the virtual visit: patient, provider  I discussed the limitations of evaluation and management by telemedicine and the availability of in person appointments. The patient expressed understanding and agreed to proceed.   HPI:  Acute telemedicine visit for COVID19: -Onset: 2 days ago, home test was positive yesterday -Symptoms include: body aches, feeling tired, sore throat, cough, fever, vomited once yesterday after taking vit C and zinc -feels a little better -Denies: CP, SOB, inability to eat/drink/get out of bed -Has tried: drinking fluids, tylenol, vit c -Pertinent past medical history: DM and per below - reports sugars have been ok, has a pump - BS was 96-170 yesterday; did get in low 200s with drinking ginger ale -Pertinent medication allergies: No Known Allergies  -denies any chance of pregnancy; FDLMP 2 days ago -COVID-19 vaccine status: has had two doses of the covid vaccine -no recent labs -no known sick contacts  ROS: See pertinent positives and negatives per HPI.  Past Medical History:  Diagnosis Date  . Diabetes mellitus without complication ([facility])   . Thyroid disease     History reviewed. No pertinent surgical history.   Current Outpatient Medications:  .  benzonatate (TESSALON PERLES) 100 MG capsule, Take 1 capsule (100 mg total) by mouth 3 (three) times daily as needed., Disp: 20 capsule, Rfl: 0 .  FIASP 100 UNIT/ML SOLN, USE UP TO 70 UNITS PER DAY IN THE PUMP., Disp: 60 mL, Rfl: 3 .  Glucagon 3 MG/DOSE POWD, PLACE 3 MG INTO THE NOSE ONCE AS NEEDED FOR UP TO 1 DOSE., Disp: 1 each, Rfl: 11 .  glucose blood (ACCU-CHEK GUIDE) test strip, For use with insulin pump to check  blood sugar 4 times a day., Disp: 400 each, Rfl: 12 .  insulin detemir (LEVEMIR) 100 UNIT/ML FlexPen, Inject into the skin., Disp: , Rfl:  .  levothyroxine (SYNTHROID) 125 MCG tablet, TAKE 1 TABLET (125 MCG TOTAL) BY MOUTH DAILY., Disp: 90 tablet, Rfl: 3 .  molnupiravir EUA 200 mg CAPS, Take 4 capsules (800 mg total) by mouth 2 (two) times daily for 5 days., Disp: 40 capsule, Rfl: 0 .  TRI FEMYNOR 0.18/0.215/0.25 MG-35 MCG tablet, TAKE 1 TABLET BY MOUTH DAILY., Disp: 84 tablet, Rfl: 98  EXAM:  VITALS per patient if applicable:  GENERAL: alert, oriented, appears well and in no acute distress  HEENT: atraumatic, conjunttiva clear, no obvious abnormalities on inspection of external nose and ears  NECK: normal movements of the head and neck  LUNGS: on inspection no signs of respiratory distress, breathing rate appears normal, no obvious gross SOB, gasping or wheezing  CV: no obvious cyanosis  MS: moves all visible extremities without noticeable abnormality  PSYCH/NEURO: pleasant and cooperative, no obvious depression or anxiety, speech and thought processing grossly intact  ASSESSMENT AND PLAN:  Discussed the following assessment and plan:  COVID-19   Discussed treatment options, ideal treatment window, potential complications, isolation and precautions for COVID-19.   After lengthy discussion, the patient opted for treatment with Molnupiravir due to being higher risk for complications of covid or severe disease and other factors. Discussed EUA status of this drug and the fact that there is preliminary limited knowledge of risks/interactions/side effects per EUA document vs  possible benefits and precautions. Because she chose this drug and is of child bearing age discussed risks with pregnancy at length and the need for use of contraception.This information was shared with patient during the visit and also was provided in patient instructions. Also, advised that patient discuss  risks/interactions and use with pharmacist/treatment team as well. Advised [facility] in stock, but she preferred that I send to [facility] and understands may not be in stock there.  The patient did want a prescription for cough, Tessalon Rx sent.  Other symptomatic care measures summarized in patient instructions.  Work/School slipped offered: provided in patient instructions  Advised to seek prompt in person care if worsening, new symptoms arise, or if is not improving with treatment. Discussed options for inperson care if PCP office not available. Did let this patient know that I only do telemedicine on Tuesdays and Thursdays for [place]. Advised to schedule follow up visit with PCP or [facility] if any further questions or concerns to avoid delays in care.   I discussed the assessment and treatment plan with the patient. The patient was provided an opportunity to ask questions and all were answered. The patient agreed with the plan and demonstrated an understanding of the instructions.     Terressa Koyanagi, DO

## 2020-07-30 NOTE — Patient Instructions (Addendum)
---------------------------------------------------------------------------------------------------------------------------    WORK SLIP:  Patient Melissa Reese,  07-Dec-2000, was seen for a medical visit today, 07/30/20 . Please excuse from work for a COVID like illness. We advise 10 days minimum from the onset of symptoms (07/28/20) PLUS 1 day of no fever and improved symptoms. Will defer to employer for a sooner return to work if symptoms have resolved, it is greater than 5 days since the positive test and the patient can wear a high-quality, tight fitting mask such as N95 or KN95 at all times for an additional 5 days. Would also suggest COVID19 antigen testing is negative prior to return.  Sincerely: E-signature: Dr. Colin Benton, DO [facility] [place] Ph: [phone]   ------------------------------------------------------------------------------------------------------------------------------  HOME CARE TIPS:  -I sent the medication(s) we discussed to your pharmacy: Meds ordered this encounter  Medications  . molnupiravir EUA 200 mg CAPS    Sig: Take 4 capsules (800 mg total) by mouth 2 (two) times daily for 5 days.    Dispense:  40 capsule    Refill:  0  . benzonatate (TESSALON PERLES) 100 MG capsule    Sig: Take 1 capsule (100 mg total) by mouth 3 (three) times daily as needed.    Dispense:  20 capsule    Refill:  0     -I sent in the [facility] treatment or referral you requested per our discussion. Please see the information provided below and discuss further with the pharmacist/treatment team.   -can use tylenol or aleve if needed for fevers, aches and pains per instructions  -can use nasal saline a few times per day if you have nasal congestion; sometimes  a short course of Afrin nasal spray for 3 days can help with symptoms as well  -stay hydrated, drink plenty of fluids and eat small healthy meals - avoid dairy  -can take 1000 IU (59mg) Vit D3  and 100-500 mg of Vit C daily per instructions  -If the Covid test is positive, [facility] for more information on home care, transmission and treatment for COVID19  -follow up with your doctor in 2-3 days unless improving and feeling better  -stay home while sick, except to seek medical care. If you have COVID19, ideally it would be best to stay home for a full 10 days since the onset of symptoms PLUS one day of no fever and feeling better. Wear a good mask that fits snugly (such as N95 or KN95) if around others to reduce the risk of transmission.  It was nice to meet you today, and I really hope you are feeling better soon. I help [facility] out with telemedicine visits on Tuesdays and Thursdays and am available for visits on those days. If you have any concerns or questions following this visit please schedule a follow up visit with your Primary Care doctor or seek care at a local urgent care clinic to avoid delays in care.    Seek in person care or schedule a follow up video visit promptly if your symptoms worsen, new concerns arise or you are not improving with treatment. Call 911 and/or seek emergency care if your symptoms are severe or life threatening.    Fact Sheet for Patients And Caregivers Emergency Use Authorization (EUA) Of LAGEVRIOT (molnupiravir) capsules For Coronavirus Disease 2019 (COVID-19)  What is the most important information I should know about LAGEVRIO? LAGEVRIO may cause serious side effects, including: . LAGEVRIO may cause harm to your  unborn baby. It is not known if LAGEVRIO will harm your baby if you take LAGEVRIO during pregnancy. o LAGEVRIO is not recommended for use in pregnancy. o LAGEVRIO has not been studied in pregnancy. LAGEVRIO was studied in pregnant animals only. When LAGEVRIO was given to pregnant animals, LAGEVRIO caused harm to their unborn babies. o You and your healthcare provider may decide that you should take LAGEVRIO  during pregnancy if there are no other COVID-19 treatment options approved or authorized by the FDA that are accessible or clinically appropriate for you. o If you and your healthcare provider decide that you should take LAGEVRIO during pregnancy, you and your healthcare provider should discuss the known and potential benefits and the potential risks of taking LAGEVRIO during pregnancy. For individuals who are able to become pregnant: . You should use a reliable method of birth control (contraception) consistently and correctly during treatment with LAGEVRIO and for 4 days after the last dose of LAGEVRIO. Talk to your healthcare provider about reliable birth control methods. . Before starting treatment with [facility] your healthcare provider may do a pregnancy test to see if you are pregnant before starting treatment with LAGEVRIO. . Tell your healthcare provider right away if you become pregnant or think you may be pregnant during treatment with LAGEVRIO. Pregnancy Surveillance Program: . There is a pregnancy surveillance program for individuals who take LAGEVRIO during pregnancy. The purpose of this program is to collect information about the health of you and your baby. Talk to your healthcare provider about how to take part in this program. . If you take LAGEVRIO during pregnancy and you agree to participate in the pregnancy surveillance program and allow your healthcare provider to share your information with [place], then your healthcare provider will report your use of Mutual during pregnancy to [place]. by calling [phone] or PeacefulBlog.es. For individuals who are sexually active with partners who are able to become pregnant: . It is not known if LAGEVRIO can affect sperm. While the risk is regarded as low, animal studies to fully assess the potential for LAGEVRIO to affect the babies of males treated with LAGEVRIO have not been  completed. A reliable method of birth control (contraception) should be used consistently and correctly during treatment with LAGEVRIO and for at least 3 months after the last dose. The risk to sperm beyond 3 months is not known. Studies to understand the risk to sperm beyond 3 months are ongoing. Talk to your healthcare provider about reliable birth control methods. Talk to your healthcare provider if you have questions or concerns about how LAGEVRIO may affect sperm. You are being given this fact sheet because your healthcare provider believes it is necessary to provide you with LAGEVRIO for the treatment of adults with mild-to-moderate coronavirus disease 2019 (COVID-19) with positive results of direct SARS-CoV-2 viral testing, and who are at high risk for progression to severe COVID-19 including hospitalization or death, and for whom other COVID-19 treatment options approved or authorized by the FDA are not accessible or clinically appropriate. The U.S. Food and Drug Administration (FDA) has issued an Emergency Use Authorization (EUA) to make LAGEVRIO available during the COVID-19 pandemic (for more details about an EUA please see "What is an Emergency Use Authorization?" at the end of this document). LAGEVRIO is not an FDA-approved medicine in the [place]. Read this Fact Sheet for information about LAGEVRIO. Talk to your healthcare provider about your options if you have any questions. It  is your choice to take LAGEVRIO.  What is COVID-19? COVID-19 is caused by a virus called a coronavirus. You can get COVID-19 through close contact with another person who has the virus. COVID-19 illnesses have ranged from very mild-to-severe, including illness resulting in death. While information so far suggests that most COVID-19 illness is mild, serious illness can happen and may cause some of your other medical conditions to become worse. Older people and people of all ages with severe, long  lasting (chronic) medical conditions like heart disease, lung disease and diabetes, for example seem to be at higher risk of being hospitalized for COVID-19.  What is LAGEVRIO? LAGEVRIO is an investigational medicine used to treat mild-to-moderate COVID-19 in adults: . with positive results of direct SARS-CoV-2 viral testing, and . who are at high risk for progression to severe COVID-19 including hospitalization or death, and for whom other COVID-19 treatment options approved or authorized by the FDA are not accessible or clinically appropriate. The FDA has authorized the emergency use of LAGEVRIO for the treatment of mild-tomoderate COVID-19 in adults under an EUA. For more information on EUA, see the "What is an Emergency Use Authorization (EUA)?" section at the end of this Fact Sheet. LAGEVRIO is not authorized: . for use in people less than 73 years of age. . for prevention of COVID-19. . for people needing hospitalization for COVID-19. . for use for longer than 5 consecutive days.  What should I tell my healthcare provider before I take LAGEVRIO? Tell your healthcare provider if you: . Have any allergies . Are breastfeeding or plan to breastfeed . Have any serious illnesses . Are taking any medicines (prescription, over-the-counter, vitamins, or herbal products).  How do I take LAGEVRIO? Marland Kitchen Take LAGEVRIO exactly as your healthcare provider tells you to take it. . Take 4 capsules of LAGEVRIO every 12 hours (for example, at 8 am and at 8 pm) . Take LAGEVRIO for 5 days. It is important that you complete the full 5 days of treatment with LAGEVRIO. Do not stop taking LAGEVRIO before you complete the full 5 days of treatment, even if you feel better. . Take LAGEVRIO with or without food. . You should stay in isolation for as long as your healthcare provider tells you to. Talk to your healthcare provider if you are not sure about how to properly isolate while you have COVID-19. Marland Kitchen  Swallow LAGEVRIO capsules whole. Do not open, break, or crush the capsules. If you cannot swallow capsules whole, tell your healthcare provider. . What to do if you miss a dose: o If it has been less than 10 hours since the missed dose, take it as soon as you remember o If it has been more than 10 hours since the missed dose, skip the missed dose and take your dose at the next scheduled time. . Do not double the dose of LAGEVRIO to make up for a missed dose.  What are the important possible side effects of LAGEVRIO? . See, "What is the most important information I should know about LAGEVRIO?" . Allergic Reactions. Allergic reactions can happen in people taking LAGEVRIO, even after only 1 dose. Stop taking LAGEVRIO and call your healthcare provider right away if you get any of the following symptoms of an allergic reaction: o hives o rapid heartbeat o trouble swallowing or breathing o swelling of the mouth, lips, or face o throat tightness o hoarseness o skin rash The most common side effects of LAGEVRIO are: . diarrhea .  nausea . dizziness These are not all the possible side effects of LAGEVRIO. Not many people have taken LAGEVRIO. Serious and unexpected side effects may happen. This medicine is still being studied, so it is possible that all of the risks are not known at this time.  What other treatment choices are there?  Veklury (remdesivir) is FDA-approved as an intravenous (IV) infusion for the treatment of mildto-moderate ZOXWR-60 in certain adults and children. Talk with your doctor to see if Marijean Heath is appropriate for you. Like LAGEVRIO, FDA may also allow for the emergency use of other medicines to treat people with COVID-19. Go to [email or web address].[email or web address] for more information. It is your choice to be treated or not to be treated with LAGEVRIO. Should you decide not to take  it, it will not change your standard medical care.  What if I am breastfeeding? Breastfeeding is not recommended during treatment with LAGEVRIO and for 4 days after the last dose of LAGEVRIO. If you are breastfeeding or plan to breastfeed, talk to your healthcare provider about your options and specific situation before taking LAGEVRIO.  How do I report side effects with LAGEVRIO? Contact your healthcare provider if you have any side effects that bother you or do not go away. Report side effects to FDA MedWatch at SmoothHits.hu or call 1-800-FDA-1088 (1- 501-425-3511).  How should I store [place]? Marland Kitchen Store LAGEVRIO capsules at room temperature between 76F to 58F (20C to 25C). Marland Kitchen Keep LAGEVRIO and all medicines out of the reach of children and pets. How can I learn more about COVID-19? Marland Kitchen Ask your healthcare provider. . Visit [email or web address] . Contact your local or state public health department. . Call [place] at [phone] (toll free in the [place] . Visit [email or web address]  What Is an Emergency Use Authorization (EUA)? The [place] FDA has made [place] available under an emergency access mechanism called an Emergency Use Authorization (EUA) The EUA is supported by a Presenter, broadcasting Health and Human Service (HHS) declaration that circumstances exist to justify emergency use of drugs and biological products during the COVID-19 pandemic. LAGEVRIO for the treatment of mild-to-moderate COVID-19 in adults with positive results of direct SARS-CoV-2 viral testing, who are at high risk for progression to severe COVID-19, including hospitalization or death, and for whom alternative COVID-19 treatment options approved or authorized by FDA are not accessible or clinically appropriate, has not undergone the same type of review as an FDA-approved product. In issuing an EUA under the NWGNF-62 public health emergency, the FDA has determined, among other things, that  based on the total amount of scientific evidence available including data from adequate and well-controlled clinical trials, if available, it is reasonable to believe that the product may be effective for diagnosing, treating, or preventing COVID-19, or a serious or life-threatening disease or condition caused by COVID-19; that the known and potential benefits of the product, when used to diagnose, treat, or prevent such disease or condition, outweigh the known and potential risks of such product; and that there are no adequate, approved, and available alternatives.  All of these criteria must be met to allow for the product to be used in the treatment of patients during the COVID-19 pandemic. The EUA for LAGEVRIO is in effect for the duration of the COVID-19 declaration justifying emergency use of LAGEVRIO, unless terminated or revoked (after which LAGEVRIO may no longer be used under the EUA). For patent information: http://rogers.info/ Copyright  2021-2022 [place] [place], [place] and its affiliates.  All rights reserved. [id number] Revised: March 2022

## 2020-08-01 ENCOUNTER — Other Ambulatory Visit (HOSPITAL_COMMUNITY): Payer: Self-pay

## 2020-08-01 MED ORDER — CARESTART COVID-19 HOME TEST VI KIT
PACK | 0 refills | Status: DC
  Filled 2020-08-01: qty 4, 4d supply, fill #0

## 2020-08-16 ENCOUNTER — Other Ambulatory Visit (HOSPITAL_COMMUNITY): Payer: Self-pay

## 2020-08-16 ENCOUNTER — Telehealth: Payer: Self-pay | Admitting: Internal Medicine

## 2020-08-16 NOTE — Telephone Encounter (Signed)
MEDICATION:  glucose blood (ACCU-CHEK GUIDE) test strip With Refills   PHARMACY:   [facility] Phone:  [phone]  Fax:  [phone]      HAS THE PATIENT CONTACTED THEIR PHARMACY?  Yes-PHARM told Patient they sent RX request with no response  IS THIS A 90 DAY SUPPLY : Yes  IS PATIENT OUT OF MEDICATION: No  IF NOT; HOW MUCH IS LEFT: Approx. 4-5 days  LAST APPOINTMENT DATE: @3 /12/2020  NEXT APPOINTMENT DATE:@7 /22/2022  DO WE HAVE YOUR PERMISSION TO LEAVE A DETAILED MESSAGE?: Yes  OTHER COMMENTS:    **Let patient know to contact pharmacy at the end of the day to make sure medication is ready. **  ** Please notify patient to allow 48-72 hours to process**  **Encourage patient to contact the pharmacy for refills or they can request refills through [facility]**

## 2020-08-19 ENCOUNTER — Other Ambulatory Visit (HOSPITAL_COMMUNITY): Payer: Self-pay

## 2020-08-19 ENCOUNTER — Other Ambulatory Visit: Payer: Self-pay | Admitting: Internal Medicine

## 2020-08-20 ENCOUNTER — Other Ambulatory Visit: Payer: Self-pay

## 2020-08-20 ENCOUNTER — Other Ambulatory Visit (HOSPITAL_COMMUNITY): Payer: Self-pay

## 2020-08-20 ENCOUNTER — Other Ambulatory Visit: Payer: Self-pay | Admitting: Internal Medicine

## 2020-08-20 DIAGNOSIS — IMO0002 Reserved for concepts with insufficient information to code with codable children: Secondary | ICD-10-CM

## 2020-08-20 DIAGNOSIS — E1040 Type 1 diabetes mellitus with diabetic neuropathy, unspecified: Secondary | ICD-10-CM

## 2020-08-20 MED ORDER — ACCU-CHEK GUIDE VI STRP
ORAL_STRIP | 12 refills | Status: DC
  Filled 2020-08-20: qty 400, 90d supply, fill #0
  Filled 2020-12-25: qty 400, 90d supply, fill #1
  Filled 2021-03-28: qty 400, 90d supply, fill #2

## 2020-08-20 NOTE — Telephone Encounter (Signed)
Rx sent 

## 2020-08-21 ENCOUNTER — Other Ambulatory Visit (HOSPITAL_COMMUNITY): Payer: Self-pay

## 2020-08-22 ENCOUNTER — Other Ambulatory Visit (HOSPITAL_COMMUNITY): Payer: Self-pay

## 2020-08-27 ENCOUNTER — Other Ambulatory Visit (HOSPITAL_COMMUNITY): Payer: Self-pay

## 2020-09-13 DIAGNOSIS — E1065 Type 1 diabetes mellitus with hyperglycemia: Secondary | ICD-10-CM | POA: Diagnosis not present

## 2020-09-13 DIAGNOSIS — E1042 Type 1 diabetes mellitus with diabetic polyneuropathy: Secondary | ICD-10-CM | POA: Diagnosis not present

## 2020-09-13 DIAGNOSIS — E1040 Type 1 diabetes mellitus with diabetic neuropathy, unspecified: Secondary | ICD-10-CM | POA: Diagnosis not present

## 2020-09-17 DIAGNOSIS — E1065 Type 1 diabetes mellitus with hyperglycemia: Secondary | ICD-10-CM | POA: Diagnosis not present

## 2020-09-17 DIAGNOSIS — E1042 Type 1 diabetes mellitus with diabetic polyneuropathy: Secondary | ICD-10-CM | POA: Diagnosis not present

## 2020-09-17 DIAGNOSIS — E1040 Type 1 diabetes mellitus with diabetic neuropathy, unspecified: Secondary | ICD-10-CM | POA: Diagnosis not present

## 2020-09-20 ENCOUNTER — Other Ambulatory Visit (HOSPITAL_COMMUNITY): Payer: Self-pay

## 2020-09-20 ENCOUNTER — Other Ambulatory Visit (INDEPENDENT_AMBULATORY_CARE_PROVIDER_SITE_OTHER): Payer: 59

## 2020-09-20 ENCOUNTER — Other Ambulatory Visit: Payer: Self-pay

## 2020-09-20 ENCOUNTER — Ambulatory Visit (INDEPENDENT_AMBULATORY_CARE_PROVIDER_SITE_OTHER): Payer: 59 | Admitting: Internal Medicine

## 2020-09-20 ENCOUNTER — Encounter: Payer: Self-pay | Admitting: Internal Medicine

## 2020-09-20 VITALS — BP 110/72 | HR 79 | Ht 62.0 in | Wt 149.6 lb

## 2020-09-20 DIAGNOSIS — E1065 Type 1 diabetes mellitus with hyperglycemia: Secondary | ICD-10-CM

## 2020-09-20 DIAGNOSIS — E039 Hypothyroidism, unspecified: Secondary | ICD-10-CM

## 2020-09-20 DIAGNOSIS — E1040 Type 1 diabetes mellitus with diabetic neuropathy, unspecified: Secondary | ICD-10-CM | POA: Diagnosis not present

## 2020-09-20 DIAGNOSIS — IMO0002 Reserved for concepts with insufficient information to code with codable children: Secondary | ICD-10-CM

## 2020-09-20 LAB — T4, FREE: Free T4: 1.08 ng/dL (ref 0.60–1.60)

## 2020-09-20 LAB — POCT GLYCOSYLATED HEMOGLOBIN (HGB A1C): Hemoglobin A1C: 7.4 % — AB (ref 4.0–5.6)

## 2020-09-20 LAB — TSH: TSH: 0.12 u[IU]/mL — ABNORMAL LOW (ref 0.35–5.50)

## 2020-09-20 MED ORDER — LEVOTHYROXINE SODIUM 112 MCG PO TABS
112.0000 ug | ORAL_TABLET | Freq: Every day | ORAL | 3 refills | Status: DC
  Filled 2020-09-20 – 2020-10-02 (×2): qty 90, 90d supply, fill #0
  Filled 2021-01-07: qty 90, 90d supply, fill #1

## 2020-09-20 NOTE — Progress Notes (Signed)
Patient ID: Melissa Reese, female   DOB: 10-27-2000, 20 y.o.   MRN: [id number]   This visit occurred during the SARS-CoV-2 public health emergency.  Safety protocols were in place, including screening questions prior to the visit, additional usage of staff PPE, and extensive cleaning of exam room while observing appropriate contact time as indicated for disinfecting solutions.   HPI: Melissa Reese is a 20 y.o.-year-old female, initially referred by her PCP, Dr. Juleen China, returning for follow-up for DM1  Dx 2003, uncontrolled, with complications (PN). Prev. seen at [facility]. Last visit with me 4 months ago.  Interim history: When I last saw the patient, she was still in college, but she graduated this May. She was also very busy with cheering and working full-time.  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  She is very excited about this and thinks that she would accept it. She has no complaints at this visit. No increased urination, blurry vision, nausea, chest pain. Her sugars did not drop as low overnight compared to last visit and she did not have to get orange juice in the morning to correct it.  Insulin pump: - Started at 20 years old - OneTouch ping - started 01/2015 - Medtronic 670 G insulin pump + guardian CGM-started 09/2016 (band aid to keep the CGM in place: Simplatch). She had a crack in her pump -received another pump 01/2018.   - Most recent: Medtronic 770 G started 01/2019.  Meter: Accu-Chek guide link  CGM: -Medtronic Guardian  Insulin: -FiAsp ([place] approved 01/04/2020)  DM1:  Reviewed HbA1c levels: Lab Results  Component Value Date   HGBA1C 7.5 (A) 05/10/2020   HGBA1C 7.2 (A) 01/10/2020   HGBA1C 6.8 (A) 08/31/2019   HGBA1C 6.9 (A) 03/24/2019   HGBA1C 6.7 (A) 11/22/2018   HGBA1C 8.0 (A) 07/19/2018   HGBA1C 8.0 (A) 03/14/2018   HGBA1C 8.7 (H) 08/24/2017   HGBA1C 8.9 04/09/2017   HGBA1C 8.4 12/22/2016    HGBA1C 9.7 (H) 09/07/2016  Prev.  02/20/2016: HbA1c 7.2% 11/21/2015: HbA1c 7.4% 08/21/2015: HbA1c 7.7% 04/24/2015: HbA1c 7.8%  Pump settings: - basal rates: 12 am: 1.3 units/h >> 1.15 3 am: 1.3 >> 1.15 6 am: 1.3 11 am: 1.1 5 pm: 1.15 9 pm: 1.25 - ICR:   12 am: 9  5 am: 7  - target:  12 am: 120-120 5:30 am: 115-115 9 pm: 120-120 - ISF:  12 am: 45  5:30 am: 45  9 pm: 45 - Insulin on Board: 4h - bolus wizard: on  Calibrate the sensor 2-4 times a day. You may need a temporary basal rate of 80% for the night after exercise.  TDD from basal insulin: 69% >> 68% >> 61% TDD from bolus insulin: 31% >> 32% >>  39% TDD 39 to 60 units units. - extended bolusing: not using - changes infusion site: Every every 4 to 5 days >> every 6-7 days >> still every 3 days.   She checks her sugars more than 4 times a day with her CGM  CGM parameters:  - Average from CGM:  177+/-78 >> 176+/-75 >> 187+/-80 - Average from manual CBG checks: 174 +/- 102 >> 207+/-131 >> 196+/-128  Time in range:  - very low (40-50): 1% >> 1% >> 1% >> 2% >> 1% - low (50-70): 1% >> 4% >> 6% >> 3% >> 1% - normal range (70-180): 63% >> 70% >> 50% >> 53% >>  55% - high sugars (180-250): 26% >> 20% >> 25% >> 27% >> 24% - very high sugars (250-400):  3% >> 7% >> 18% >> 50% >> 19%   - in auto mode: 5% >> 74 % >> 57% >> 59% >> 62% >> 59% of the time - in manual mode: 95% >> 26 % >> 43% >> 41% >> 38% >> 41% of the time  She calibrates the sensor 1.6 times a day   Previously:   Lowest sugar:  44 mostly in am >> LO >> 40s. She has hypoglycemia awareness at 12s.  She had previous visits to the ED for hypoglycemia years ago, not recently. Highest sugar: >400 ... >> 300s >> 400s >> >400. No previous DKA admissions.  No CKD, last BUN/creatinine:  Lab Results  Component Value Date   BUN 9 08/31/2019   BUN 7 07/19/2018   CREATININE 1.03 08/31/2019   CREATININE 0.83 07/19/2018  Not on ACE inhibitor/ARB.  + HL;  last set of lipids: Lab Results  Component Value Date   CHOL 166 08/31/2019   HDL 54.30 08/31/2019   LDLCALC 93 08/31/2019   TRIG 93.0 08/31/2019   CHOLHDL 3 08/31/2019  08/21/2015: 145/148/45/76 Not on a statin.  - latest eye exam: 06/2020: No DR  - + numbness and tingling in her feet.   Hypothyroidism:  Pt is on levothyroxine 125 mcg daily, taken: - in am - fasting - at least 30 min from b'fast - no calcium - no iron - no multivitamins - no PPIs - not on Biotin  Reviewed her TFTs: Lab Results  Component Value Date   TSH 25.79 (H) 05/10/2020   TSH 4.88 (H) 01/10/2020   TSH 7.24 (H) 08/31/2019   TSH 0.27 (L) 03/24/2019   TSH 0.02 (L) 01/03/2019   TSH 0.01 (L) 11/22/2018   TSH 0.47 07/19/2018   TSH 5.29 (H) 03/14/2018   TSH 4.16 10/11/2017   TSH 21.48 (H) 08/24/2017  11/21/2015: TSH 6.397 08/21/2015: TSH 41.268 04/24/2015: TSH 14.788 08/16/2014: TSH 0.407  Pt denies: - feeling nodules in neck - hoarseness - dysphagia - choking - SOB with lying down  She started college in August 2018 >> May 2022. Sociology major.   ROS: Constitutional: no weight gain/no weight loss, no fatigue, no subjective hyperthermia, no subjective hypothermia Eyes: no blurry vision, no xerophthalmia ENT: no sore throat, + see HPI Cardiovascular: no CP/no SOB/no palpitations/no leg swelling Respiratory: no cough/no SOB/no wheezing Gastrointestinal: no N/no V/no D/no C/no acid reflux Musculoskeletal: no muscle aches/no joint aches Skin: no rashes, no hair loss Neurological: no tremors/+ numbness/+ tingling/no dizziness  I reviewed pt's medications, allergies, PMH, social hx, family hx, and changes were documented in the history of present illness. Otherwise, unchanged from my initial visit note.  Past Medical History:  Diagnosis Date   Diabetes mellitus without complication ([place])    Thyroid disease     Social History   Social History   Marital status: Single    Spouse  name: N/A   Number of children: 0   Occupational History   student   Social History Main Topics   Smoking status: Never Smoker   Smokeless tobacco: Never Used   Alcohol use No   Drug use: No   Current Outpatient Medications on File Prior to Visit  Medication Sig Dispense Refill   benzonatate (TESSALON PERLES) 100 MG capsule Take 1 capsule (100 mg total) by mouth 3 (three) times daily as needed. 20 capsule 0  COVID-19 At Home Antigen Test [facility] COVID-19 HOME TEST) KIT Use as directed 4 each 0   FIASP 100 UNIT/ML SOLN USE UP TO 70 UNITS PER DAY IN THE PUMP. 60 mL 3   Glucagon 3 MG/DOSE POWD PLACE 3 MG INTO THE NOSE ONCE AS NEEDED FOR UP TO 1 DOSE. 1 each 11   glucose blood (ACCU-CHEK GUIDE) test strip For use with insulin pump to check blood sugar 4 times a day. 400 each 12   insulin detemir (LEVEMIR) 100 UNIT/ML FlexPen Inject into the skin.     levothyroxine (SYNTHROID) 125 MCG tablet TAKE 1 TABLET (125 MCG TOTAL) BY MOUTH DAILY. 90 tablet 3   TRI FEMYNOR 0.18/0.215/0.25 MG-35 MCG tablet TAKE 1 TABLET BY MOUTH DAILY. 84 tablet 98   No current facility-administered medications on file prior to visit.   No Known Allergies Family History  Problem Relation Age of Onset   Allergies Father    PE: There were no vitals taken for this visit. Wt Readings from Last 3 Encounters:  05/10/20 148 lb 12.8 oz (67.5 kg)  02/28/20 144 lb 12.8 oz (65.7 kg)  02/12/20 147 lb 12.8 oz (67 kg)   Constitutional: normal weight, in NAD Eyes: PERRLA, EOMI, no exophthalmos ENT: moist mucous membranes, no thyromegaly, no cervical lymphadenopathy Cardiovascular: RRR, No MRG Respiratory: CTA B Gastrointestinal: abdomen soft, NT, ND, BS+ Musculoskeletal: no deformities, strength intact in all 4 Skin: moist, warm, no rashes Neurological: no tremor with outstretched hands, DTR normal in all 4   ASSESSMENT: 1. DM1, uncontrolled, with complications - PN  2. Hypothyroidism  PLAN:  1. Patient  with longstanding, uncontrolled, type 1 diabetes, on insulin pump therapy.  She switched to a Medtronic 770 G insulin pump in 01/2019.  This is integrated with the Medtronic Guardian sensor.  At last visit, HbA1c (7.5%) was higher as she was very busy in sports and finishing college.  At that time, sugars were also more fluctuating as she was not having consistent meals. She also was not entering enough carbs into the pump and we discussed about the importance of doing so.  She even had days in which she was not  entering any carbs at all.  She feels that she is doing a better job with this.  Sugars were lower in the morning and overnight and we reduced her basal rates during the night. CGM interpretation: -At today's visit, we reviewed her CGM downloads: It appears that 55% of values are in target range (goal >70%), while 43% are higher than 180 (goal <25%), and 2% are lower than 70 (goal <4%).  The calculated average blood sugar is 187.   -Reviewing the CGM trends, her sugars are not dropping overnight anymore, but they are higher in a.m. when she has to work from 4 AM and also more variable in the second half of the day.  Upon review of the individual day profile, her sugars were very fluctuating when she was at the beach and she was not able to stay in the auto mode.  She had very high blood sugars at that time and also a period of lower sugars.  She returns from the beach 5 days ago and sugars did improve since then, but there is still variability and it appears that some of these stem from not enough calibration of the sensor and also not bolusing at the starting of the meal but after she started a meal.  She is, indeed, doing a much better job  with entering her carbs into the pump. I advised him to start the boluses approximately 5 minutes before each meal. -Since her sugars are higher later in the day, I also advised him to try to increase the basal rate from 11 AM to 5 PM slightly.  We discussed about  going back to the previous basal rate if she sees more low blood sugars then. -I advised her to:  Patient Instructions  Please use the following pump settings: - basal rates: 12 am: 1.15 3 am: 1.15 6 am: 1.3 11 am: 1.1 (try to increase to 1.2) 5 pm: 1.15 9 pm: 1.25 - ICR:   12 am: 9  5 am: 7  - target:  12 am: 120-120 5:30 am: 115-115 9 pm: 120-120 - ISF:  12 am: 45  5:30 am: 45  9 pm: 45 - Insulin on Board: 4h - bolus wizard: on  Calibrate the sensor 2-4 times a day.  You may need a temporary basal rate of 80% for the night after exercise.  Change the site/tubing/insulin every 3-4 days: Sun and Wed  Please do the following 5 min before a meal: - Enter carbs (C) - Enter sugars (S) - Start insulin bolus (I)  Please continue Levothyroxine 125 mcg daily.  Take the thyroid hormone every day, with water, at least 30 minutes before breakfast, separated by at least 4 hours from: - acid reflux medications - calcium - iron - multivitamins  Please return in 4 months.  - we checked her HbA1c: 7.4% (lower) - advised to check sugars at different times of the day - 4x a day, rotating check times - advised for yearly eye exams >> she is up-to-date - We will check her annual labs at next visit - return to clinic in 4 months  2. Hypothyroidism -She has a history of poorly controlled, longstanding, hypothyroidism she has fluctuating TFTs due to incomplete compliance with levothyroxine. - latest thyroid labs reviewed with pt. >> TSH was high: Lab Results  Component Value Date   TSH 25.79 (H) 05/10/2020  - she continues on LT4 125 mcg daily - pt feels good on this dose. - we discussed about taking the thyroid hormone every day, with water, >30 minutes before breakfast, separated by >4 hours from acid reflux medications, calcium, iron, multivitamins. Pt. was not taking it correctly at last visit as she was drinking orange juice to raise her blood sugars and taking  levothyroxine later - will check thyroid tests today: TSH and fT4 - If labs are abnormal, she will need to return for repeat TFTs in 1.5 months  Component     Latest Ref Rng & Units 09/20/2020  TSH     0.35 - 5.50 uIU/mL 0.12 (L)  T4,Free(Direct)     0.60 - 1.60 ng/dL 1.08  TSH is now suppressed.  I will advise her to Decrease the dose back to 112 micrograms daily and recheck the tests in 1.5 months.  Philemon Kingdom, MD PhD [facility]

## 2020-09-20 NOTE — Patient Instructions (Addendum)
Please use the following pump settings: - basal rates: 12 am: 1.15 3 am: 1.15 6 am: 1.3 11 am: 1.1 (try to increase to 1.2) 5 pm: 1.15 9 pm: 1.25 - ICR:   12 am: 9  5 am: 7  - target:  12 am: 120-120 5:30 am: 115-115 9 pm: 120-120 - ISF:  12 am: 45  5:30 am: 45  9 pm: 45 - Insulin on Board: 4h - bolus wizard: on  Calibrate the sensor 2-4 times a day.  You may need a temporary basal rate of 80% for the night after exercise.  Change the site/tubing/insulin every 3-4 days: Sun and Wed  Please do the following 5 min before a meal: - Enter carbs (C) - Enter sugars (S) - Start insulin bolus (I)  Please continue Levothyroxine 125 mcg daily.  Take the thyroid hormone every day, with water, at least 30 minutes before breakfast, separated by at least 4 hours from: - acid reflux medications - calcium - iron - multivitamins  Please return in 4 months.

## 2020-09-30 ENCOUNTER — Other Ambulatory Visit (HOSPITAL_COMMUNITY): Payer: Self-pay

## 2020-10-02 ENCOUNTER — Other Ambulatory Visit (HOSPITAL_COMMUNITY): Payer: Self-pay

## 2020-10-08 DIAGNOSIS — E1065 Type 1 diabetes mellitus with hyperglycemia: Secondary | ICD-10-CM | POA: Diagnosis not present

## 2020-10-08 DIAGNOSIS — E1040 Type 1 diabetes mellitus with diabetic neuropathy, unspecified: Secondary | ICD-10-CM | POA: Diagnosis not present

## 2020-10-08 DIAGNOSIS — E1042 Type 1 diabetes mellitus with diabetic polyneuropathy: Secondary | ICD-10-CM | POA: Diagnosis not present

## 2020-10-25 MED FILL — Norgestimate-Eth Estrad Tab 0.18-35/0.215-35/0.25-35 MG-MCG: ORAL | 84 days supply | Qty: 84 | Fill #1 | Status: AC

## 2020-10-28 ENCOUNTER — Other Ambulatory Visit (HOSPITAL_COMMUNITY): Payer: Self-pay

## 2020-11-19 ENCOUNTER — Encounter: Payer: Self-pay | Admitting: Internal Medicine

## 2020-12-11 DIAGNOSIS — E1040 Type 1 diabetes mellitus with diabetic neuropathy, unspecified: Secondary | ICD-10-CM | POA: Diagnosis not present

## 2020-12-11 DIAGNOSIS — E1065 Type 1 diabetes mellitus with hyperglycemia: Secondary | ICD-10-CM | POA: Diagnosis not present

## 2020-12-11 DIAGNOSIS — E1042 Type 1 diabetes mellitus with diabetic polyneuropathy: Secondary | ICD-10-CM | POA: Diagnosis not present

## 2020-12-17 DIAGNOSIS — E1040 Type 1 diabetes mellitus with diabetic neuropathy, unspecified: Secondary | ICD-10-CM | POA: Diagnosis not present

## 2020-12-17 DIAGNOSIS — E1042 Type 1 diabetes mellitus with diabetic polyneuropathy: Secondary | ICD-10-CM | POA: Diagnosis not present

## 2020-12-17 DIAGNOSIS — E1065 Type 1 diabetes mellitus with hyperglycemia: Secondary | ICD-10-CM | POA: Diagnosis not present

## 2020-12-25 ENCOUNTER — Other Ambulatory Visit: Payer: Self-pay | Admitting: Internal Medicine

## 2020-12-25 ENCOUNTER — Other Ambulatory Visit (HOSPITAL_COMMUNITY): Payer: Self-pay

## 2020-12-25 MED ORDER — FIASP 100 UNIT/ML IJ SOLN
70.0000 [IU] | INTRAMUSCULAR | 3 refills | Status: DC
  Filled 2020-12-25: qty 60, 85d supply, fill #0
  Filled 2021-03-28: qty 60, 85d supply, fill #1
  Filled 2021-07-29 – 2021-08-12 (×3): qty 60, 85d supply, fill #2

## 2020-12-26 ENCOUNTER — Other Ambulatory Visit (HOSPITAL_COMMUNITY): Payer: Self-pay

## 2020-12-29 ENCOUNTER — Encounter: Payer: Self-pay | Admitting: Internal Medicine

## 2020-12-30 DIAGNOSIS — E1040 Type 1 diabetes mellitus with diabetic neuropathy, unspecified: Secondary | ICD-10-CM | POA: Diagnosis not present

## 2020-12-30 DIAGNOSIS — E1042 Type 1 diabetes mellitus with diabetic polyneuropathy: Secondary | ICD-10-CM | POA: Diagnosis not present

## 2020-12-30 DIAGNOSIS — E1065 Type 1 diabetes mellitus with hyperglycemia: Secondary | ICD-10-CM | POA: Diagnosis not present

## 2021-01-01 ENCOUNTER — Other Ambulatory Visit (HOSPITAL_COMMUNITY): Payer: Self-pay

## 2021-01-01 MED ORDER — LYUMJEV 100 UNIT/ML IJ SOLN
INTRAMUSCULAR | 3 refills | Status: DC
  Filled 2021-01-01: qty 60, 85d supply, fill #0

## 2021-01-06 ENCOUNTER — Other Ambulatory Visit (HOSPITAL_COMMUNITY): Payer: Self-pay

## 2021-01-06 ENCOUNTER — Other Ambulatory Visit: Payer: Self-pay | Admitting: Family Medicine

## 2021-01-06 DIAGNOSIS — Z3041 Encounter for surveillance of contraceptive pills: Secondary | ICD-10-CM

## 2021-01-07 ENCOUNTER — Other Ambulatory Visit (HOSPITAL_COMMUNITY): Payer: Self-pay

## 2021-01-07 ENCOUNTER — Other Ambulatory Visit: Payer: Self-pay | Admitting: Family Medicine

## 2021-01-07 DIAGNOSIS — Z3041 Encounter for surveillance of contraceptive pills: Secondary | ICD-10-CM

## 2021-01-09 ENCOUNTER — Other Ambulatory Visit: Payer: Self-pay

## 2021-01-09 ENCOUNTER — Encounter: Payer: 59 | Admitting: Physician Assistant

## 2021-01-09 ENCOUNTER — Ambulatory Visit (INDEPENDENT_AMBULATORY_CARE_PROVIDER_SITE_OTHER): Payer: 59 | Admitting: *Deleted

## 2021-01-09 DIAGNOSIS — Z23 Encounter for immunization: Secondary | ICD-10-CM | POA: Diagnosis not present

## 2021-01-10 ENCOUNTER — Telehealth: Payer: Self-pay

## 2021-01-10 NOTE — Telephone Encounter (Signed)
Inbound fax requesting forms be completed and faxed. Forms completed and faxed to [phone].

## 2021-01-15 ENCOUNTER — Other Ambulatory Visit: Payer: Self-pay

## 2021-01-15 ENCOUNTER — Telehealth: Payer: Self-pay

## 2021-01-15 ENCOUNTER — Other Ambulatory Visit (HOSPITAL_COMMUNITY): Payer: Self-pay

## 2021-01-15 DIAGNOSIS — Z3041 Encounter for surveillance of contraceptive pills: Secondary | ICD-10-CM

## 2021-01-15 MED ORDER — TRI FEMYNOR 0.18/0.215/0.25 MG-35 MCG PO TABS
1.0000 | ORAL_TABLET | Freq: Every day | ORAL | 0 refills | Status: DC
  Filled 2021-01-15: qty 84, 84d supply, fill #0

## 2021-01-15 NOTE — Telephone Encounter (Signed)
Rx sent in

## 2021-01-15 NOTE — Telephone Encounter (Signed)
LAST APPOINTMENT DATE:  02/28/20  NEXT APPOINTMENT DATE: 01/22/21 TOC  MEDICATION:TRI FEMYNOR 0.18/0.215/0.25 MG-35 MCG tablet  PHARMACY: [facility]

## 2021-01-16 ENCOUNTER — Other Ambulatory Visit (HOSPITAL_COMMUNITY): Payer: Self-pay

## 2021-01-22 ENCOUNTER — Ambulatory Visit: Payer: 59 | Admitting: Physician Assistant

## 2021-01-22 ENCOUNTER — Encounter: Payer: Self-pay | Admitting: Physician Assistant

## 2021-01-22 ENCOUNTER — Other Ambulatory Visit: Payer: Self-pay

## 2021-01-22 VITALS — BP 110/74 | HR 68 | Temp 98.0°F | Ht 62.0 in | Wt 152.2 lb

## 2021-01-22 DIAGNOSIS — Z3041 Encounter for surveillance of contraceptive pills: Secondary | ICD-10-CM | POA: Diagnosis not present

## 2021-01-22 DIAGNOSIS — Z23 Encounter for immunization: Secondary | ICD-10-CM | POA: Diagnosis not present

## 2021-01-22 NOTE — Progress Notes (Signed)
Subjective:    Patient ID: Melissa Reese, female    DOB: Jan 10, 2001, 20 y.o.   MRN: [id number]  Chief Complaint  Patient presents with   Establish Care     HPI 20 y.o. patient presents today for new patient establishment with me.  Patient was previously established with Dr. Artis Flock. Working remotely from home. 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   Current Care Team: -Dr. Elvera Lennox - Endocrinology - Type 1 DM -Nutrition appointment in December, new establishment; states she has been working hard on taking control of her diet and exercise for diabetes improvement and wants additional help with her nutrition  Acute Concerns: Needs me to take over prescribing her birth control. No issues with this Rx. Cycle every 28 days, no problems. Last pap smear normal in 2021, repeat in 2024.   Chronic Concerns: See PMH listed below, as well as A/P for details on issues we specifically discussed during today's visit.      Past Medical History:  Diagnosis Date   Diabetes mellitus without complication ([facility])    Thyroid disease     History reviewed. No pertinent surgical history.  Family History  Problem Relation Age of Onset   Allergies Father     Social History   Tobacco Use   Smoking status: Never   Smokeless tobacco: Never  Substance Use Topics   Alcohol use: No   Drug use: No     No Known Allergies  Review of Systems NEGATIVE UNLESS OTHERWISE INDICATED IN HPI      Objective:     BP 110/74   Pulse 68   Temp 98 F (36.7 C)   Ht 5\' 2"  (1.575 m)   Wt 152 lb 3.2 oz (69 kg)   LMP 01/14/2021   SpO2 100%   BMI 27.84 kg/m   Wt Readings from Last 3 Encounters:  01/22/21 152 lb 3.2 oz (69 kg)  09/20/20 149 lb 9.6 oz (67.9 kg)  05/10/20 148 lb 12.8 oz (67.5 kg)    BP Readings from Last 3 Encounters:  01/22/21 110/74  09/20/20 110/72  05/10/20 120/82     Physical Exam Vitals and nursing note reviewed.  Constitutional:      Appearance: Normal appearance. She  is normal weight. She is not toxic-appearing.  HENT:     Head: Normocephalic and atraumatic.     Right Ear: External ear normal.     Left Ear: External ear normal.     Nose: Nose normal.     Mouth/Throat:     Mouth: Mucous membranes are moist.  Eyes:     Extraocular Movements: Extraocular movements intact.     Conjunctiva/sclera: Conjunctivae normal.     Pupils: Pupils are equal, round, and reactive to light.  Cardiovascular:     Rate and Rhythm: Normal rate and regular rhythm.     Pulses: Normal pulses.     Heart sounds: Normal heart sounds.  Pulmonary:     Effort: Pulmonary effort is normal.     Breath sounds: Normal breath sounds.  Musculoskeletal:        General: Normal range of motion.     Cervical back: Normal range of motion and neck supple.  Skin:    General: Skin is warm and dry.  Neurological:     General: No focal deficit present.     Mental Status: She is alert and oriented to person, place, and time.  Psychiatric:        Mood and Affect:  Mood normal.        Behavior: Behavior normal.        Thought Content: Thought content normal.        Judgment: Judgment normal.       Assessment & Plan:   Problem List Items Addressed This Visit   None Visit Diagnoses     Encounter for surveillance of contraceptive pills    -  Primary   Need for HPV vaccine       Relevant Orders   HPV 9-valent vaccine,Recombinat       1. Encounter for surveillance of contraceptive pills -Doing well with [facility] OCP; she will call for refills -UTD with pap smear -Non-smoker -BP normal -F/up in 1 year  2. Need for HPV vaccine -Updated vaccination today. Series completed.    This note was prepared with assistance of Conservation officer, historic buildings. Occasional wrong-word or sound-a-like substitutions may have occurred due to the inherent limitations of voice recognition software.  Time Spent: 28 minutes of total time was spent on the date of the encounter performing the  following actions: chart review prior to seeing the patient, obtaining history, performing a medically necessary exam, counseling on the treatment plan, placing orders, and documenting in our EHR.    Caillou Minus M Ylianna Almanzar, PA-C

## 2021-01-22 NOTE — Patient Instructions (Signed)
Good to meet you today!  Keep up the great work with taking care of your health. Call for refills on birth control. Your 3rd HPV vaccine was updated, series completed today.

## 2021-01-27 ENCOUNTER — Other Ambulatory Visit: Payer: Self-pay

## 2021-01-27 ENCOUNTER — Encounter: Payer: Self-pay | Admitting: Internal Medicine

## 2021-01-27 ENCOUNTER — Other Ambulatory Visit (HOSPITAL_COMMUNITY): Payer: Self-pay

## 2021-01-27 ENCOUNTER — Ambulatory Visit (INDEPENDENT_AMBULATORY_CARE_PROVIDER_SITE_OTHER): Payer: 59 | Admitting: Internal Medicine

## 2021-01-27 ENCOUNTER — Telehealth: Payer: Self-pay | Admitting: Pharmacy Technician

## 2021-01-27 VITALS — BP 110/60 | Ht 62.0 in | Wt 154.2 lb

## 2021-01-27 DIAGNOSIS — E1042 Type 1 diabetes mellitus with diabetic polyneuropathy: Secondary | ICD-10-CM | POA: Diagnosis not present

## 2021-01-27 DIAGNOSIS — E1065 Type 1 diabetes mellitus with hyperglycemia: Secondary | ICD-10-CM | POA: Diagnosis not present

## 2021-01-27 DIAGNOSIS — E039 Hypothyroidism, unspecified: Secondary | ICD-10-CM

## 2021-01-27 DIAGNOSIS — IMO0002 Reserved for concepts with insufficient information to code with codable children: Secondary | ICD-10-CM

## 2021-01-27 LAB — POCT GLYCOSYLATED HEMOGLOBIN (HGB A1C): Hemoglobin A1C: 7.5 % — AB (ref 4.0–5.6)

## 2021-01-27 MED ORDER — OMNIPOD 5 DEXG7G6 INTRO GEN 5 KIT
1.0000 | PACK | 0 refills | Status: DC | PRN
  Filled 2021-01-27: qty 1, 90d supply, fill #0

## 2021-01-27 MED ORDER — OMNIPOD 5 DEXG7G6 PODS GEN 5 MISC
1.0000 | 3 refills | Status: DC
  Filled 2021-01-27: qty 10, 30d supply, fill #0

## 2021-01-27 MED ORDER — DEXCOM G6 RECEIVER DEVI
1.0000 | Freq: Once | 0 refills | Status: DC
  Filled 2021-01-27: qty 1, 90d supply, fill #0

## 2021-01-27 MED ORDER — DEXCOM G6 TRANSMITTER MISC
1.0000 | 3 refills | Status: DC
  Filled 2021-01-27: qty 1, 90d supply, fill #0

## 2021-01-27 MED ORDER — DEXCOM G6 SENSOR MISC
1.0000 | 3 refills | Status: DC
  Filled 2021-01-27: qty 9, 84d supply, fill #0

## 2021-01-27 NOTE — Progress Notes (Signed)
Patient ID: Melissa Reese, female   DOB: [birth year], 20 y.o.   MRN: [id number]   This visit occurred during the SARS-CoV-2 public health emergency.  Safety protocols were in place, including screening questions prior to the visit, additional usage of staff PPE, and extensive cleaning of exam room while observing appropriate contact time as indicated for disinfecting solutions.   HPI: Melissa Reese is a 20 y.o.-year-old female, initially referred by her PCP, Dr. Juleen Reese, returning for follow-up for DM1  Dx 2003, uncontrolled, with complications (PN). Prev. seen at [facility]. Last visit with me 4 months ago.  Interim history: No increased urination, blurry vision, nausea, chest pain.  However, she is feeling more poorly in the last week, due to the holidays (Thanksgiving) and not having the sensor on (due to initial error of the CGM communicating with the pump)  -sugars have been higher.  Insulin pump: - Started at 20 years old - OneTouch ping - started 01/2015 - Medtronic 670 G insulin pump + guardian CGM-started 09/2016 (band aid to keep the CGM in place: Simplatch). She had a crack in her pump -received another pump 01/2018.   - Most recent: Medtronic 770 G started 01/2019.  Meter: Accu-Chek guide link  CGM: -Medtronic Guardian  Insulin: -FiAsp (PA approved 01/04/2020)  DM1:  Reviewed HbA1c levels: Lab Results  Component Value Date   HGBA1C 7.4 (A) 09/20/2020   HGBA1C 7.5 (A) 05/10/2020   HGBA1C 7.2 (A) 01/10/2020   HGBA1C 6.8 (A) 08/31/2019   HGBA1C 6.9 (A) 03/24/2019   HGBA1C 6.7 (A) 11/22/2018   HGBA1C 8.0 (A) 07/19/2018   HGBA1C 8.0 (A) 03/14/2018   HGBA1C 8.7 (H) 08/24/2017   HGBA1C 8.9 04/09/2017   HGBA1C 8.4 12/22/2016   HGBA1C 9.7 (H) 09/07/2016  Prev.  02/20/2016: HbA1c 7.2% 11/21/2015: HbA1c 7.4% 08/21/2015: HbA1c 7.7% 04/24/2015: HbA1c 7.8%  Pump settings: - basal rates: 12 am: 1.15 3 am: 1.15 6 am: 1.3 11 am: 1.2 5 pm: 1.15 9 pm:  1.25 - ICR:   12 am: 9  5 am: 7  - target:  12 am: 120-120 5:30 am: 115-115 9 pm: 120-120 - ISF:  12 am: 45  5:30 am: 45  9 pm: 45 - Insulin on Board: 4h - bolus wizard: on Calibrate the sensor 2-4 times a day. You may need a temporary basal rate of 80% for the night after exercise.  TDD from basal insulin: 69% >> 68% >> 61% >> 69% TDD from bolus insulin: 31% >> 32% >>  39% >> 31% TDD 39 to 60 units units. - extended bolusing: not using - changes infusion site: Every every 4 to 5 days >> every 6-7 days >> every 3-4 days.   She checks her sugars more than 4 times a day with her CGM:   Prev.:   Lowest sugar:  LO >> 40s >> 40. She has hypoglycemia awareness at 101s.  She had previous visits to the ED for hypoglycemia years ago, not recently. Highest sugar: 400s >> >400 >> >400. No previous DKA admissions.  No CKD, last BUN/creatinine:  Lab Results  Component Value Date   BUN 9 08/31/2019   BUN 7 07/19/2018   CREATININE 1.03 08/31/2019   CREATININE 0.83 07/19/2018  Not on ACE inhibitor/ARB.  + HL; last set of lipids: Lab Results  Component Value Date   CHOL 166 08/31/2019   HDL 54.30 08/31/2019   LDLCALC 93 08/31/2019   TRIG 93.0 08/31/2019   CHOLHDL 3 08/31/2019  08/21/2015: 145/148/45/76 Not on a statin.  - latest eye exam: 06/2020: No DR  - + numbness and tingling in her feet.   Hypothyroidism:  Pt is on levothyroxine 125 mcg daily, taken: - in am - fasting - at least 30 min from b'fast - no calcium - no iron - no multivitamins - no PPIs - not on Biotin  Reviewed her TFTs: Lab Results  Component Value Date   TSH 0.12 (L) 09/20/2020   TSH 25.79 (H) 05/10/2020   TSH 4.88 (H) 01/10/2020   TSH 7.24 (H) 08/31/2019   TSH 0.27 (L) 03/24/2019   TSH 0.02 (L) 01/03/2019   TSH 0.01 (L) 11/22/2018   TSH 0.47 07/19/2018   TSH 5.29 (H) 03/14/2018   TSH 4.16 10/11/2017  11/21/2015: TSH 6.397 08/21/2015: TSH 41.268 04/24/2015: TSH 14.788 08/16/2014:  TSH 0.407  Pt denies: - feeling nodules in neck - hoarseness - dysphagia - choking - SOB with lying down  She started college in August 2018 >> May 2022. Sociology major.  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  She still has [employer] shifts-approximately twice a week.  ROS: + see HPI this is Neurological: no tremors/+ numbness/+ tingling/no dizziness  I reviewed pt's medications, allergies, PMH, social hx, family hx, and changes were documented in the history of present illness. Otherwise, unchanged from my initial visit note.  Past Medical History:  Diagnosis Date   Diabetes mellitus without complication ([place])    Thyroid disease     Social History   Social History   Marital status: Single    Spouse name: N/A   Number of children: 0   Occupational History   student   Social History Main Topics   Smoking status: Never Smoker   Smokeless tobacco: Never Used   Alcohol use No   Drug use: No   Current Outpatient Medications on File Prior to Visit  Medication Sig Dispense Refill   COVID-19 At Home Antigen Test (CARESTART COVID-19 HOME TEST) KIT Use as directed 4 each 0   FIASP 100 UNIT/ML SOLN USE UP TO 70 UNITS PER DAY IN THE PUMP. 60 mL 3   Glucagon 3 MG/DOSE POWD PLACE 3 MG INTO THE NOSE ONCE AS NEEDED FOR UP TO 1 DOSE. 1 each 11   glucose blood (ACCU-CHEK GUIDE) test strip For use with insulin pump to check blood sugar 4 times a day. 400 each 12   insulin detemir (LEVEMIR) 100 UNIT/ML FlexPen Inject into the skin.     Insulin Lispro-aabc (LYUMJEV) 100 UNIT/ML SOLN Use in insulin pump up to 70 units a day 60 mL 3   levothyroxine (SYNTHROID) 112 MCG tablet Take 1 tablet (112 mcg total) by mouth daily. 45 tablet 3   TRI FEMYNOR 0.18/0.215/0.25 MG-35 MCG tablet Take 1 tablet by mouth daily. 84 tablet 0   No current facility-administered medications on file prior to visit.   No Known Allergies Family History  Problem Relation Age of Onset    Allergies Father    PE: LMP 01/14/2021  Wt Readings from Last 3 Encounters:  01/22/21 152 lb 3.2 oz (69 kg)  09/20/20 149 lb 9.6 oz (67.9 kg)  05/10/20 148 lb 12.8 oz (67.5 kg)   Constitutional: normal weight, in NAD Eyes: PERRLA, EOMI, no exophthalmos ENT: moist mucous membranes, no thyromegaly, no cervical lymphadenopathy Cardiovascular: RRR, No MRG Respiratory: CTA B Musculoskeletal: no deformities, strength intact in all 4 Skin: moist, warm, no rashes Neurological: no tremor with outstretched  hands, DTR normal in all 4   ASSESSMENT: 1. DM1, uncontrolled, with complications - PN  2. Hypothyroidism  PLAN:  1. Patient with longstanding, uncontrolled, type 1 diabetes, on insulin pump therapy.  She switched to a Medtronic 770 G insulin pump in 01/2019.  This is integrated with the Medtronic Guardian sensor.  At last visit, HbA1c was 7.4%, slightly lower.  She was preparing to start a job after [employer].  Sugars were not dropping overnight anymore but they were higher in the morning when she was working from 4 AM and more variable in the second half of the day.  She had very high blood sugars when she was at the beach and she was out at the auto mode at that time.  We also discussed about the need to calibrate the sensor up to 4 times a day.  She was doing a better job entering her carbs into the pump and we discussed about starting the boluses approximately 5 minutes before each meal, since she is using Fiasp in the pump.  Since sugars are higher later in the day, I did advise her to try to increase the basal rate from 11 AM to 5 PM.  We also discussed about backing off of this if she sees more low blood sugars. CGM interpretation: -At today's visit, we reviewed her CGM downloads: It appears that 56% of values are in target range (goal >70%), while 42% are higher than 180 (goal <25%), and 2% are lower than 70 (goal <4%).  The calculated average blood sugar is 177.  -Reviewing the  CGM trends, it appears that her sugars are quite high overnight, then they decreased with a nadir around 8-9 AM, after which they increase particularly after lunch and then again after dinner.  She only takes 31% of her insulin from boluses and the rest from basal rates, which is not conducive to good control.  In the last 2 weeks she missed many insulin boluses and we discussed about trying her best to take Fiasp before the meals.  Since her sugars continue to increase occasionally even after taking Fiasp before the meals, I advised her that sometimes she may need to take Fiasp few minutes before the meal.  Also, we discussed about the importance of starting the meal with fiber, protein, and fat, rather than carbs to carb the significant increase in blood sugars afterwards. -At today's visit, since her sugars are so high during the night, we increased her basal rate from 9 PM to 3 AM.  Since sugars are low in the morning we will back off the basal rate from 6 AM to 11 AM.  For now, I advised her to continue the same insulin to carb ratios. -The rest of the pump settings, appear to be adequate, but there is high variability in her meals and we discussed about trying to get a better schedule.  After the first of the year, she will return to work in person, and I am hoping that the increased activity will help with the blood sugars. -At this visit, we discussed about changing from her current insulin pump to the OmniPod 5-Dexcom G6 closed-loop system.  I would highly recommend the system for her.  She is very interested in a tubeless pump and I believe that this will be the best for her.  I did show her the OmniPod and we also discussed briefly about the t:slim pump, but since this is not tubeless, we will not pursue this.  As of now, I did send a prescription for the OmniPod 5 to her pharmacy and also for Dexcom supplies.  I am hoping that her insurance would cover them, since her diabetes is inadequately  controlled by the current pump. -She has a known expired glucagon kit at home -I advised her to:  Patient Instructions  Please use the following pump settings: - basal rates: 12 am: 1.15 >> 1.20 3 am: 1.15 6 am: 1.30 >> 1.20 11 am: 1.20 5 pm: 1.15 9 pm: 1.25 >> 1.30 - ICR:   12 am: 9  5 am: 7  - target:  12 am: 120-120 5:30 am: 115-115 9 pm: 120-120 - ISF:  12 am: 45  5:30 am: 45  9 pm: 45 - Insulin on Board: 4h - bolus wizard: on  Calibrate the sensor 2-4 times a day.  You may need a temporary basal rate of 80% for the night after exercise.  Change the site/tubing/insulin every 3-4 days: Sun and Wed.  Please do the following 5 min before a meal: - Enter all carbs (C) - Enter sugars (S) - Start insulin bolus (I)      Try to start the meal with fiber, protein and fat and end with carbs.  Try to change the pump to Omnipod 5 and also change the CGM to Dexcom G6.  Please continue Levothyroxine 112 mcg daily.  Take the thyroid hormone every day, with water, at least 30 minutes before breakfast, separated by at least 4 hours from: - acid reflux medications - calcium - iron - multivitamins  Please return in 4 months.  - we checked her HbA1c: 7.5% (slightly higher) - advised to check sugars at different times of the day - 4x a day, rotating check times - advised for yearly eye exams >> she is UTD - will check annual labs today - return to clinic in 4 months  2. Hypothyroidism -She has a history of poorly controlled, longstanding, hypothyroidism she has fluctuating TFTs due to incomplete compliance with levothyroxine. - latest thyroid labs reviewed with pt. >> normal: Lab Results  Component Value Date   TSH 0.12 (L) 09/20/2020  - she continues on LT4 112 mcg daily, decreased after the above results returned  - pt feels good on this dose. - we discussed about taking the thyroid hormone every day, with water, >30 minutes before breakfast, separated by >4 hours  from acid reflux medications, calcium, iron, multivitamins. Pt. is taking it correctly. - will check thyroid tests today: TSH and fT4 - If labs are abnormal, she will need to return for repeat TFTs in 1.5 months  Total time spent for the visit: 40 min, in reviewing her pump downloads, discussing her hypo- and hyper-glycemic episodes, reviewing previous labs and pump settings and developing a plan to avoid hypo- and hyper-glycemia.   Component     Latest Ref Rng & Units 01/27/2021  Sodium     135 - 145 mEq/L 132 (L)  Potassium     3.5 - 5.1 mEq/L 4.1  Chloride     96 - 112 mEq/L 96  CO2     19 - 32 mEq/L 25  Glucose     70 - 99 mg/dL 293 (H)  BUN     6 - 23 mg/dL 11  Creatinine     0.40 - 1.20 mg/dL 0.89  Total Bilirubin     0.2 - 1.2 mg/dL 0.8  Alkaline Phosphatase     39 - 117 U/L  59  AST     0 - 37 U/L 13  ALT     0 - 35 U/L 8  Total Protein     6.0 - 8.3 g/dL 7.1  Albumin     3.5 - 5.2 g/dL 4.3  Calcium     8.4 - 10.5 mg/dL 9.7  GFR     >60.00 mL/min 91.84  Cholesterol     0 - 200 mg/dL 175  Triglycerides     0.0 - 149.0 mg/dL 124.0  HDL Cholesterol     >39.00 mg/dL 52.30  VLDL     0.0 - 40.0 mg/dL 24.8  LDL (calc)     0 - 99 mg/dL 98  Total CHOL/HDL Ratio      3  NonHDL      122.81  Microalb, Ur     0.0 - 1.9 mg/dL 1.0  Creatinine,U     mg/dL 134.5  MICROALB/CREAT RATIO     0.0 - 30.0 mg/g 0.7  Hemoglobin A1C     4.0 - 5.6 % 7.5 (A)  TSH     0.35 - 5.50 uIU/mL 0.25 (L)  T4,Free(Direct)     0.60 - 1.60 ng/dL 1.73 (H)   High glucose and dilutional hyponatremia. TSH is now closer to the normal range.  Due to previous variability in her TFTs, we will continue the same dose of levothyroxine for now and recheck the tests at next visit.  Philemon Kingdom, MD PhD [facility]

## 2021-01-27 NOTE — Telephone Encounter (Signed)
Patient Advocate Encounter  Received notification from [employer] that prior authorization for DEXCOM G6 SENSOR, TRANSMITTER, RECEIVER is required.   PA submitted on 11.28.22 Transmitter Key: [id number] Sensor Key: [id number] Receiver IWO:[id number] Status is pending   [facility] will continue to follow  Ricke Hey, CPhT Patient Advocate [facility] Phone: [phone] Fax:  [phone]

## 2021-01-27 NOTE — Patient Instructions (Addendum)
Please use the following pump settings: - basal rates: 12 am: 1.15 >> 1.20 3 am: 1.15 6 am: 1.30 >> 1.20 11 am: 1.20 5 pm: 1.15 9 pm: 1.25 >> 1.30 - ICR:   12 am: 9  5 am: 7  - target:  12 am: 120-120 5:30 am: 115-115 9 pm: 120-120 - ISF:  12 am: 45  5:30 am: 45  9 pm: 45 - Insulin on Board: 4h - bolus wizard: on  Calibrate the sensor 2-4 times a day.  You may need a temporary basal rate of 80% for the night after exercise.  Change the site/tubing/insulin every 3-4 days: Sun and Wed.  Please do the following 5 min before a meal: - Enter all carbs (C) - Enter sugars (S) - Start insulin bolus (I)      Try to start the meal with fiber, protein and fat and end with carbs.  Try to change the pump to Omnipod 5 and also change the CGM to Dexcom G6.  Please continue Levothyroxine 112 mcg daily.  Take the thyroid hormone every day, with water, at least 30 minutes before breakfast, separated by at least 4 hours from: - acid reflux medications - calcium - iron - multivitamins  Please return in 4 months.

## 2021-01-28 ENCOUNTER — Other Ambulatory Visit (HOSPITAL_COMMUNITY): Payer: Self-pay

## 2021-01-28 LAB — COMPREHENSIVE METABOLIC PANEL
ALT: 8 U/L (ref 0–35)
AST: 13 U/L (ref 0–37)
Albumin: 4.3 g/dL (ref 3.5–5.2)
Alkaline Phosphatase: 59 U/L (ref 39–117)
BUN: 11 mg/dL (ref 6–23)
CO2: 25 mEq/L (ref 19–32)
Calcium: 9.7 mg/dL (ref 8.4–10.5)
Chloride: 96 mEq/L (ref 96–112)
Creatinine, Ser: 0.89 mg/dL (ref 0.40–1.20)
GFR: 91.84 mL/min (ref >60.00)
Glucose, Bld: 293 mg/dL — ABNORMAL HIGH (ref 70–99)
Potassium: 4.1 mEq/L (ref 3.5–5.1)
Sodium: 132 mEq/L — ABNORMAL LOW (ref 135–145)
Total Bilirubin: 0.8 mg/dL (ref 0.2–1.2)
Total Protein: 7.1 g/dL (ref 6.0–8.3)

## 2021-01-28 LAB — LIPID PANEL
Cholesterol: 175 mg/dL (ref 0–200)
HDL: 52.3 mg/dL (ref >39.00)
LDL Cholesterol: 98 mg/dL (ref 0–99)
NonHDL: 122.81
Total CHOL/HDL Ratio: 3
Triglycerides: 124 mg/dL (ref 0.0–149.0)
VLDL: 24.8 mg/dL (ref 0.0–40.0)

## 2021-01-28 LAB — MICROALBUMIN / CREATININE URINE RATIO
Creatinine,U: 134.5 mg/dL
Microalb Creat Ratio: 0.7 mg/g (ref 0.0–30.0)
Microalb, Ur: 1 mg/dL (ref 0.0–1.9)

## 2021-01-28 LAB — T4, FREE: Free T4: 1.73 ng/dL — ABNORMAL HIGH (ref 0.60–1.60)

## 2021-01-28 LAB — TSH: TSH: 0.25 u[IU]/mL — ABNORMAL LOW (ref 0.35–5.50)

## 2021-01-28 NOTE — Telephone Encounter (Signed)
Received notification from [employer] regarding a prior authorization for DEXCOM G6 SENSOR, TRANSMITTER, RECEIVER. Authorization6  has been APPROVED from 11.28.22 to 11.27.23.   Per test claim for Transmitter, copay for 90 days supply is $90 Authorization #[id number]  Per test claim for Sensor copay for 30 days supply is $64.33 Authorization #[id number]  Per test claim for Receiver, copay for 1/365 days supply is $68.04 Authorization #[id number]

## 2021-01-29 ENCOUNTER — Telehealth: Payer: Self-pay | Admitting: Internal Medicine

## 2021-01-29 NOTE — Telephone Encounter (Signed)
Do you know, would this be a PA? I am not quite sure how to formulate the letter.Marland KitchenMarland Kitchen

## 2021-01-29 NOTE — Telephone Encounter (Signed)
Can we provide a letter for patient to get a exception for co-pay

## 2021-01-29 NOTE — Telephone Encounter (Signed)
Mother of PT called and needs CO-PAY exception sent to [employer] at [phone] on the below. Dexcom G6: Receiver, transmitter, sensor and  OMNIPOD

## 2021-01-31 ENCOUNTER — Encounter: Payer: Self-pay | Admitting: Internal Medicine

## 2021-01-31 NOTE — Telephone Encounter (Signed)
Letter signed and faxed to [employer] at [phone]

## 2021-01-31 NOTE — Telephone Encounter (Signed)
Number provided by pt's mother was incorrect. Letter faxed to [phone].

## 2021-02-06 ENCOUNTER — Encounter: Payer: Self-pay | Admitting: Internal Medicine

## 2021-02-06 NOTE — Telephone Encounter (Signed)
Tier exception PA submitted. Ominpod Receiver PA [phone] [employer] PA [phone]

## 2021-02-12 ENCOUNTER — Other Ambulatory Visit (HOSPITAL_COMMUNITY): Payer: Self-pay

## 2021-02-12 NOTE — Telephone Encounter (Signed)
Called and left a message for pt to call back regarding Dexcom supplies.

## 2021-02-12 NOTE — Telephone Encounter (Signed)
Oh, that is great!  Tileshia, can we give her a sample until she establishes with [facility]? Thank you! CG

## 2021-02-12 NOTE — Telephone Encounter (Signed)
Pt contacted via MyChart message with information.

## 2021-02-12 NOTE — Telephone Encounter (Signed)
Patient Advocate Encounter  Received notification from [employer] that the request for Copay exception for Dexcom has been denied due to their plan not covering copay exceptions.  However, the pt can get the dexcom system through their medical coverage from [place] or [facility] at a $0 copay. From what I've heard, [facility] is better.  There was a copay card from [facility] that the pt could use to get started, that reduces their copays drastically, but it's for a limited $ amount and typically will only cover most of 1 fill. It will not bring it down to $0. This would at least give them a month to get things set up for mail order.  I'll message you the link for the savings voucher.

## 2021-02-19 ENCOUNTER — Ambulatory Visit: Payer: 59 | Admitting: Dietician

## 2021-02-19 ENCOUNTER — Other Ambulatory Visit (HOSPITAL_COMMUNITY): Payer: Self-pay

## 2021-02-26 ENCOUNTER — Telehealth: Payer: Self-pay

## 2021-02-26 NOTE — Telephone Encounter (Signed)
Inbound fax requesting forms be completed and faxed with recent clinical notes. Forms completed and faxed to [employer] at [phone]

## 2021-02-28 ENCOUNTER — Telehealth: Payer: Self-pay | Admitting: Internal Medicine

## 2021-03-05 NOTE — Telephone Encounter (Signed)
Paperwork faxed to [employer] at [phone].

## 2021-03-05 NOTE — Telephone Encounter (Signed)
Melissa Reese with [employer] called re: Only the first page of the request for forms to be completed along with recent clinical notes. Melissa Reese requests the above be faxed again to [employer] at [phone].

## 2021-03-13 DIAGNOSIS — E1065 Type 1 diabetes mellitus with hyperglycemia: Secondary | ICD-10-CM | POA: Diagnosis not present

## 2021-03-13 DIAGNOSIS — E1042 Type 1 diabetes mellitus with diabetic polyneuropathy: Secondary | ICD-10-CM | POA: Diagnosis not present

## 2021-03-13 DIAGNOSIS — E1040 Type 1 diabetes mellitus with diabetic neuropathy, unspecified: Secondary | ICD-10-CM | POA: Diagnosis not present

## 2021-03-14 DIAGNOSIS — E1040 Type 1 diabetes mellitus with diabetic neuropathy, unspecified: Secondary | ICD-10-CM | POA: Diagnosis not present

## 2021-03-14 DIAGNOSIS — E1042 Type 1 diabetes mellitus with diabetic polyneuropathy: Secondary | ICD-10-CM | POA: Diagnosis not present

## 2021-03-14 DIAGNOSIS — E1065 Type 1 diabetes mellitus with hyperglycemia: Secondary | ICD-10-CM | POA: Diagnosis not present

## 2021-03-21 DIAGNOSIS — E1042 Type 1 diabetes mellitus with diabetic polyneuropathy: Secondary | ICD-10-CM | POA: Diagnosis not present

## 2021-03-21 DIAGNOSIS — E1040 Type 1 diabetes mellitus with diabetic neuropathy, unspecified: Secondary | ICD-10-CM | POA: Diagnosis not present

## 2021-03-21 DIAGNOSIS — E1065 Type 1 diabetes mellitus with hyperglycemia: Secondary | ICD-10-CM | POA: Diagnosis not present

## 2021-03-28 ENCOUNTER — Other Ambulatory Visit: Payer: Self-pay | Admitting: Internal Medicine

## 2021-03-28 ENCOUNTER — Other Ambulatory Visit: Payer: Self-pay | Admitting: Physician Assistant

## 2021-03-28 ENCOUNTER — Other Ambulatory Visit (HOSPITAL_COMMUNITY): Payer: Self-pay

## 2021-03-28 DIAGNOSIS — Z3041 Encounter for surveillance of contraceptive pills: Secondary | ICD-10-CM

## 2021-03-28 MED ORDER — LEVOTHYROXINE SODIUM 112 MCG PO TABS
112.0000 ug | ORAL_TABLET | Freq: Every day | ORAL | 3 refills | Status: DC
  Filled 2021-03-28 – 2021-04-10 (×2): qty 90, 90d supply, fill #0
  Filled 2021-06-29: qty 90, 90d supply, fill #1

## 2021-03-29 MED ORDER — TRI FEMYNOR 0.18/0.215/0.25 MG-35 MCG PO TABS
1.0000 | ORAL_TABLET | Freq: Every day | ORAL | 0 refills | Status: DC
  Filled 2021-03-29 – 2021-04-10 (×2): qty 84, 84d supply, fill #0

## 2021-03-31 ENCOUNTER — Other Ambulatory Visit (HOSPITAL_COMMUNITY): Payer: Self-pay

## 2021-04-07 ENCOUNTER — Other Ambulatory Visit (HOSPITAL_COMMUNITY): Payer: Self-pay

## 2021-04-10 ENCOUNTER — Other Ambulatory Visit (HOSPITAL_COMMUNITY): Payer: Self-pay

## 2021-04-11 ENCOUNTER — Other Ambulatory Visit (HOSPITAL_COMMUNITY): Payer: Self-pay

## 2021-05-29 ENCOUNTER — Encounter: Payer: Self-pay | Admitting: Internal Medicine

## 2021-05-29 ENCOUNTER — Ambulatory Visit (INDEPENDENT_AMBULATORY_CARE_PROVIDER_SITE_OTHER): Payer: 59 | Admitting: Internal Medicine

## 2021-05-29 VITALS — BP 120/78 | Ht 62.0 in | Wt 158.6 lb

## 2021-05-29 DIAGNOSIS — E1065 Type 1 diabetes mellitus with hyperglycemia: Secondary | ICD-10-CM | POA: Diagnosis not present

## 2021-05-29 DIAGNOSIS — E039 Hypothyroidism, unspecified: Secondary | ICD-10-CM

## 2021-05-29 DIAGNOSIS — E1042 Type 1 diabetes mellitus with diabetic polyneuropathy: Secondary | ICD-10-CM

## 2021-05-29 LAB — POCT GLYCOSYLATED HEMOGLOBIN (HGB A1C): Hemoglobin A1C: 7.5 % — AB (ref 4.0–5.6)

## 2021-05-29 LAB — TSH: TSH: 25.32 u[IU]/mL — ABNORMAL HIGH (ref 0.35–5.50)

## 2021-05-29 LAB — T4, FREE: Free T4: 1.12 ng/dL (ref 0.60–1.60)

## 2021-05-29 NOTE — Patient Instructions (Addendum)
Please use the following pump settings: ?- basal rates: ?12 am: 1.15 >> 1.20 ?3 am: 1.15 ?6 am: 1.30 >> 1.20 ?11 am: 1.20 ?5 pm: 1.15 ?9 pm: 1.25 >> 1.30 ?- ICR:  ? 12 am: 9 ? 5 am: 7 ? ?- target:  ?12 am: 120-120 ?5:30 am: 115-115 ?9 pm: 120-120 ?- ISF:  ?12 am: 45 ? 5:30 am: 45 ? 9 pm: 45 ?- Insulin on Board: 4h ?- bolus wizard: on ? ?Calibrate the sensor 2-4 times a day. ?You may need a temporary basal rate of 80% for the night after exercise. ?Change the site/tubing/insulin every 3-4 days: Sun and Wed. ? ?Please do the following before a meal: ?- Enter all carbs (C) ?- Enter sugars (S) ?- Start insulin bolus (I) ?   ?  Try to start the meal with fiber, protein and fat and end with carbs. ? ?Please continue Levothyroxine 112 mcg daily. ? ?Take the thyroid hormone every day, with water, at least 30 minutes before breakfast, separated by at least 4 hours from: ?- acid reflux medications ?- calcium ?- iron ?- multivitamins ? ?Please return in 4 months. ?

## 2021-05-29 NOTE — Progress Notes (Signed)
Patient ID: Melissa Reese, female   DOB: [birth year], 21 y.o.   MRN: [id number]  ? ?This visit occurred during the SARS-CoV-2 public health emergency.  Safety protocols were in place, including screening questions prior to the visit, additional usage of staff PPE, and extensive cleaning of exam room while observing appropriate contact time as indicated for disinfecting solutions.  ? ?HPI: ?Melissa Reese is a 21 y.o.-year-old female, initially referred by her PCP, Dr. Juleen Reese, returning for follow-up for DM1  Dx 2003, uncontrolled, with complications (PN). Prev. seen at [facility]. Last visit with me 4 months ago. ? ?Interim history: ?No increased urination, blurry vision, nausea, chest pain.   ?At last visit, I recommended to switch to the OmniPod 5 insulin pump with a Dexcom CGM.  However, this system was not covered for her so at today's visit, she is still on Medtronic system. She did change the pump since last visit  - same model. ?She started using a walking pad >> started to walk 8000-10000 steps a day - started last week. ? ?Insulin pump: ?- Started at 21 years old ?- OneTouch ping - started 01/2015 ?- Medtronic 670 G insulin pump + guardian CGM-started 09/2016 (band aid to keep the CGM in place: Simplatch). She had a crack in her pump -received another pump 01/2018.   ?- Most recent: [id number] 932 G started 01/2019 - renewed 03/2021.   ? ?Meter:  ?-Accu-Chek guide link ? ?CGM: ?-Medtronic Guardian ? ?Insulin: ?-FiAsp ([place] approved 01/04/2020) ? ?DM1: ? ?Reviewed HbA1c levels: ?Lab Results  ?Component Value Date  ? HGBA1C 7.5 (A) 01/27/2021  ? HGBA1C 7.4 (A) 09/20/2020  ? HGBA1C 7.5 (A) 05/10/2020  ? HGBA1C 7.2 (A) 01/10/2020  ? HGBA1C 6.8 (A) 08/31/2019  ? HGBA1C 6.9 (A) 03/24/2019  ? HGBA1C 6.7 (A) 11/22/2018  ? HGBA1C 8.0 (A) 07/19/2018  ? HGBA1C 8.0 (A) 03/14/2018  ? HGBA1C 8.7 (H) 08/24/2017  ? HGBA1C 8.9 04/09/2017  ? HGBA1C 8.4 12/22/2016  ? HGBA1C 9.7 (H) 09/07/2016  ?Prev.  ?02/20/2016:  HbA1c 7.2% ?11/21/2015: HbA1c 7.4% ?08/21/2015: HbA1c 7.7% ?04/24/2015: HbA1c 7.8% ? ?Pump settings: (she did not make the changes suggested at last visit) ?- basal rates: ?12 am: 1.15 3 am: 1.15 ?6 am: 1.30 11 am: 1.20 ?5 pm: 1.15 ?9 pm: 1.25- ICR:  ? 12 am: 9 ? 5 am: 7 ? ?- target:  ?12 am: 120-120 ?5:30 am: 115-115 ?9 pm: 120-120 ?- ISF:  ?12 am: 45 ? 5:30 am: 45 ? 9 pm: 45 ?- Insulin on Board: 4h ?- bolus wizard: on ?Calibrate the sensor 2-4 times a day. ?You may need a temporary basal rate of 80% for the night after exercise. ? ?TDD from basal insulin: 61% >> 69% >> 70% ?TDD from bolus insulin: 39% >> 31% >> 30% ?TDD 39 to 60 units units. ?- extended bolusing: not using ?- changes infusion site: Every every 4 to 5 days >> every 6-7 days >> every 3-5 days. ? ? She checks her sugars more than 4 times a day with her CGM: ? ? ?Prev.: ? ? ?Prev.: ? ? ?Lowest sugar:  LO >> 40s >> 40 >> 45. She has hypoglycemia awareness at 30s.  She had previous visits to the ED for hypoglycemia years ago, not recently. ?Highest sugar: 400s >> >400 >> >400 >> >400s. No previous DKA admissions. ? ?No CKD, last BUN/creatinine:  ?Lab Results  ?Component Value Date  ? BUN 11 01/27/2021  ? BUN 9 08/31/2019  ?  CREATININE 0.89 01/27/2021  ? CREATININE 1.03 08/31/2019  ?Not on ACE inhibitor/ARB. ? ?+ HL; last set of lipids: ?Lab Results  ?Component Value Date  ? CHOL 175 01/27/2021  ? HDL 52.30 01/27/2021  ? [place] 98 01/27/2021  ? TRIG 124.0 01/27/2021  ? CHOLHDL 3 01/27/2021  ?08/21/2015: 145/148/45/76 ?Not on a statin. ? ?- latest eye exam: 06/2020: No DR ? ?- + numbness and tingling in her feet.  She has ingrown toenails. ? ?Hypothyroidism: ? ?Pt is on levothyroxine 125 mcg daily, taken: ?- in am ?- fasting ?- at least 30 min from b'fast ?- no calcium ?- no iron ?- no multivitamins ?- no PPIs ?- not on Biotin ? ?Reviewed her TFTs: ?Lab Results  ?Component Value Date  ? TSH 0.25 (L) 01/27/2021  ? TSH 0.12 (L) 09/20/2020  ? TSH 25.79 (H)  05/10/2020  ? TSH 4.88 (H) 01/10/2020  ? TSH 7.24 (H) 08/31/2019  ? TSH 0.27 (L) 03/24/2019  ? TSH 0.02 (L) 01/03/2019  ? TSH 0.01 (L) 11/22/2018  ? TSH 0.47 07/19/2018  ? TSH 5.29 (H) 03/14/2018  ?11/21/2015: TSH 6.397 ?08/21/2015: TSH 41.268 ?04/24/2015: TSH 14.788 ?08/16/2014: TSH 0.407 ? ?Pt denies: ?- feeling nodules in neck ?- hoarseness ?- dysphagia ?- choking ?- SOB with lying down ? ?She started college in August 2018 >> May 2022. Sociology major.  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  She still has [employer] shifts-approximately twice a week. ? ?ROS: ?+ see HPI  ?Neurological: no tremors/+ numbness/+ tingling/no dizziness ? ?I reviewed pt's medications, allergies, PMH, social hx, family hx, and changes were documented in the history of present illness. Otherwise, unchanged from my initial visit note. ? ?Past Medical History:  ?Diagnosis Date  ? Diabetes mellitus without complication ([place])   ? Thyroid disease   ? ? ?Social History  ? ?Social History  ? Marital status: Single  ?  Spouse name: N/A  ? Number of children: 0  ? ?Occupational History  ? student  ? ?Social History Main Topics  ? Smoking status: Never Smoker  ? Smokeless tobacco: Never Used  ? Alcohol use No  ? Drug use: No  ? ?Current Outpatient Medications on File Prior to Visit  ?Medication Sig Dispense Refill  ? Continuous Blood Gluc Transmit (DEXCOM G6 TRANSMITTER) MISC Use every 3 (three) months. 1 each 3  ? COVID-19 At Home Antigen Test [facility] COVID-19 HOME TEST) KIT Use as directed 4 each 0  ? FIASP 100 UNIT/ML SOLN USE UP TO 70 UNITS PER DAY IN THE PUMP. 60 mL 3  ? Glucagon 3 MG/DOSE POWD PLACE 3 MG INTO THE NOSE ONCE AS NEEDED FOR UP TO 1 DOSE. 1 each 11  ? glucose blood (ACCU-CHEK GUIDE) test strip For use with insulin pump to check blood sugar 4 times a day. 400 each 12  ? insulin detemir (LEVEMIR) 100 UNIT/ML FlexPen Inject into the skin.    ? Insulin Disposable Pump (OMNIPOD 5 G6 INTRO, GEN 5,) KIT Use as  directed 1 kit 0  ? Insulin Disposable Pump (OMNIPOD 5 G6 POD, GEN 5,) MISC Use as directed every 3 (three) days. 30 each 3  ? Insulin Lispro-aabc (LYUMJEV) 100 UNIT/ML SOLN Use in insulin pump up to 70 units a day 60 mL 3  ? levothyroxine (SYNTHROID) 112 MCG tablet Take 1 tablet (112 mcg total) by mouth daily. 45 tablet 3  ? TRI FEMYNOR 0.18/0.215/0.25 MG-35 MCG tablet Take 1 tablet by mouth daily.  84 tablet 0  ? ?No current facility-administered medications on file prior to visit.  ? ?No Known Allergies ?Family History  ?Problem Relation Age of Onset  ? Allergies Father   ? ?PE: ?BP 120/78 (BP Location: Right Arm, Patient Position: Sitting, Cuff Size: Normal)   Ht _0  (1.575 m)   Wt 158 lb 9.6 oz (71.9 kg)   BMI 29.01 kg/m?  ?Wt Readings from Last 3 Encounters:  ?05/29/21 158 lb 9.6 oz (71.9 kg)  ?01/27/21 154 lb 3.2 oz (69.9 kg)  ?01/22/21 152 lb 3.2 oz (69 kg)  ? ?Constitutional: normal weight, in NAD ?Eyes: PERRLA, EOMI, no exophthalmos ?ENT: moist mucous membranes, no thyromegaly, no cervical lymphadenopathy ?Cardiovascular: RRR, No MRG ?Respiratory: CTA B ?Musculoskeletal: no deformities, strength intact in all 4 ?Skin: moist, warm, no rashes ?Neurological: no tremor with outstretched hands, DTR normal in all 4 ?Diabetic Foot Exam - Simple   ?Simple Foot Form ?Diabetic Foot exam was performed with the following findings: Yes 05/29/2021  9:33 AM  ?Visual Inspection ?No deformities, no ulcerations, no other skin breakdown bilaterally: Yes ?Sensation Testing ?Intact to touch and monofilament testing bilaterally: Yes ?Pulse Check ?Posterior Tibialis and Dorsalis pulse intact bilaterally: Yes ?Comments ?  ? ? ?ASSESSMENT: ?1. DM1, uncontrolled, with complications ?- PN ? ?2. Hypothyroidism ? ?PLAN:  ?1. Patient with longstanding, uncontrolled, type 1 diabetes, on insulin pump therapy. She switched to a Medtronic 770 G insulin pump in 01/2019.  This is integrated with the Medtronic Guardian sensor.  At last  visit, I did recommend to switch to the OmniPod 5 system integrated with the Dexcom CGM but she was not able to obtain this due to insurance coverage.  She obtained a new Medtronic 770 G insulin pump which she

## 2021-06-06 DIAGNOSIS — E1040 Type 1 diabetes mellitus with diabetic neuropathy, unspecified: Secondary | ICD-10-CM | POA: Diagnosis not present

## 2021-06-06 DIAGNOSIS — E1042 Type 1 diabetes mellitus with diabetic polyneuropathy: Secondary | ICD-10-CM | POA: Diagnosis not present

## 2021-06-06 DIAGNOSIS — E1065 Type 1 diabetes mellitus with hyperglycemia: Secondary | ICD-10-CM | POA: Diagnosis not present

## 2021-06-11 DIAGNOSIS — E1042 Type 1 diabetes mellitus with diabetic polyneuropathy: Secondary | ICD-10-CM | POA: Diagnosis not present

## 2021-06-11 DIAGNOSIS — E1040 Type 1 diabetes mellitus with diabetic neuropathy, unspecified: Secondary | ICD-10-CM | POA: Diagnosis not present

## 2021-06-11 DIAGNOSIS — E1065 Type 1 diabetes mellitus with hyperglycemia: Secondary | ICD-10-CM | POA: Diagnosis not present

## 2021-06-29 ENCOUNTER — Telehealth: Payer: Self-pay | Admitting: Physician Assistant

## 2021-06-29 DIAGNOSIS — Z3041 Encounter for surveillance of contraceptive pills: Secondary | ICD-10-CM

## 2021-06-30 ENCOUNTER — Other Ambulatory Visit (HOSPITAL_COMMUNITY): Payer: Self-pay

## 2021-06-30 MED ORDER — TRI FEMYNOR 0.18/0.215/0.25 MG-35 MCG PO TABS
1.0000 | ORAL_TABLET | Freq: Every day | ORAL | 0 refills | Status: DC
  Filled 2021-06-30 – 2021-07-02 (×3): qty 84, 84d supply, fill #0

## 2021-07-01 ENCOUNTER — Other Ambulatory Visit (HOSPITAL_COMMUNITY): Payer: Self-pay

## 2021-07-01 NOTE — Telephone Encounter (Signed)
Pt states a MyChart return message will be fine. ? ?Pt went to pick up refill and pharmacy informed her that the name brand of this medication was discontinued. ?Pt was offered generic and would like confirmation that the generic form will not cause any complications. ? ? ?Rx #: [id number]  ?TRI FEMYNOR 0.18/0.215/0.25 MG-35 MCG tablet [[id number]]  ? ?

## 2021-07-02 ENCOUNTER — Other Ambulatory Visit (HOSPITAL_COMMUNITY): Payer: Self-pay

## 2021-07-02 ENCOUNTER — Other Ambulatory Visit: Payer: Self-pay | Admitting: Physician Assistant

## 2021-07-02 DIAGNOSIS — Z3041 Encounter for surveillance of contraceptive pills: Secondary | ICD-10-CM

## 2021-07-02 NOTE — Telephone Encounter (Signed)
Sent pt MyChart msg to advise Alyssa's recommendations. ?

## 2021-07-03 ENCOUNTER — Other Ambulatory Visit (HOSPITAL_COMMUNITY): Payer: Self-pay

## 2021-07-03 ENCOUNTER — Other Ambulatory Visit: Payer: Self-pay | Admitting: Physician Assistant

## 2021-07-03 DIAGNOSIS — Z3041 Encounter for surveillance of contraceptive pills: Secondary | ICD-10-CM

## 2021-07-03 MED ORDER — NORGESTIM-ETH ESTRAD TRIPHASIC 0.18/0.215/0.25 MG-35 MCG PO TABS
1.0000 | ORAL_TABLET | Freq: Every day | ORAL | 0 refills | Status: DC
  Filled 2021-07-03: qty 84, 84d supply, fill #0

## 2021-07-25 ENCOUNTER — Encounter: Payer: Self-pay | Admitting: Physician Assistant

## 2021-07-25 ENCOUNTER — Ambulatory Visit: Payer: 59 | Admitting: Physician Assistant

## 2021-07-25 VITALS — BP 106/68 | HR 90 | Temp 98.0°F | Ht 62.0 in | Wt 157.6 lb

## 2021-07-25 DIAGNOSIS — R4589 Other symptoms and signs involving emotional state: Secondary | ICD-10-CM | POA: Diagnosis not present

## 2021-07-25 DIAGNOSIS — E039 Hypothyroidism, unspecified: Secondary | ICD-10-CM | POA: Diagnosis not present

## 2021-07-25 DIAGNOSIS — Z6828 Body mass index (BMI) 28.0-28.9, adult: Secondary | ICD-10-CM

## 2021-07-25 DIAGNOSIS — E1042 Type 1 diabetes mellitus with diabetic polyneuropathy: Secondary | ICD-10-CM | POA: Diagnosis not present

## 2021-07-25 DIAGNOSIS — E1065 Type 1 diabetes mellitus with hyperglycemia: Secondary | ICD-10-CM

## 2021-07-25 NOTE — Patient Instructions (Signed)
Good to see you today, Melissa Reese!   I'm going to place a referral to nutrition to help guide you better.  Continue to take your medications as directed.  I would encourage you to start incorporating weight lifting at least 2 times per week. ?Look into a personal trainer and/ or GROUP fitness classes to help with a sense of community.   Look into [employer] App for personal counseling.  Reach out if any concerns. Happy to help!

## 2021-07-25 NOTE — Progress Notes (Unsigned)
Subjective:    Patient ID: Melissa Reese, female    DOB: [birth year], 21 y.o.   MRN: [id number]  Chief Complaint  Patient presents with   Labs Only    HPI Patient is in today for to discuss trouble losing weight.  Pt has hx of hypothyroidism and T1DM. Recent OV with Dr. Elvera Lennox 05/29/21 showed elevated TSH at 25.32.  Trying to lose weight for her height so she can get her BMI down.  Walks daily, drinks water, has been noticing trouble losing weight.   In the last year, graduated college, sitting more at her job. Bought a walking pad for work.   138-140 lbs a few years ago and says she "felt better." Wanting labs to check everything. Wanting to feel better and have more confidence.  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 -Sleep has been "ok." 9:30 or 10 pm - 7:30 am. Occ has to wake up for water / urinate once.  -Goes to [employer] after work, walks, yoga mat activities. 8-10k steps daily. -Drinking water in the mornings, takes thyroid medication; then has granola bar or [employer] for egg-bites; chicken salad / left-overs for lunch; occ fast-food & tries to choose healthier options -Denies much snacking at work, occ chips / dip at home after work, fruits -Usually drinks just water, very rare diet coke or coffee   Sensor to check sugars 2-3x / day. Fluctuates often lately.  Menstrual cycles are OK, regular. Doing well with her birth control. Started taking OCPs in 2018.  Past Medical History:  Diagnosis Date   Diabetes mellitus without complication (HCC)    Thyroid disease     History reviewed. No pertinent surgical history.  Family History  Problem Relation Age of Onset   Allergies Father     Social History   Tobacco Use   Smoking status: Never   Smokeless tobacco: Never  Substance Use Topics   Alcohol use: No   Drug use: No     No Known Allergies  Review of Systems NEGATIVE UNLESS OTHERWISE INDICATED IN HPI      Objective:      BP 106/68 (BP Location: Left Arm, Patient Position: Sitting, Cuff Size: Normal)   Pulse 90   Temp 98 F (36.7 C) (Oral)   Ht 5\' 2"  (1.575 m)   Wt 157 lb 9.6 oz (71.5 kg)   SpO2 99%   BMI 28.83 kg/m   Wt Readings from Last 3 Encounters:  07/25/21 157 lb 9.6 oz (71.5 kg)  05/29/21 158 lb 9.6 oz (71.9 kg)  01/27/21 154 lb 3.2 oz (69.9 kg)    BP Readings from Last 3 Encounters:  07/25/21 106/68  05/29/21 120/78  01/27/21 110/60     Physical Exam Vitals and nursing note reviewed.  Constitutional:      Appearance: Normal appearance. She is normal weight. She is not ill-appearing or toxic-appearing.  HENT:     Head: Normocephalic and atraumatic.     Right Ear: External ear normal.     Left Ear: External ear normal.     Nose: Nose normal.     Mouth/Throat:     Mouth: Mucous membranes are moist.  Eyes:     Extraocular Movements: Extraocular movements intact.     Conjunctiva/sclera: Conjunctivae normal.     Pupils: Pupils are equal, round, and reactive to light.  Cardiovascular:     Rate and Rhythm: Normal rate and regular rhythm.  Pulses: Normal pulses.     Heart sounds: Normal heart sounds.  Pulmonary:     Effort: Pulmonary effort is normal.     Breath sounds: Normal breath sounds.  Musculoskeletal:        General: Normal range of motion.     Cervical back: Normal range of motion and neck supple.  Skin:    General: Skin is warm and dry.  Neurological:     General: No focal deficit present.     Mental Status: She is alert and oriented to person, place, and time.  Psychiatric:        Mood and Affect: Affect is tearful.        Behavior: Behavior normal.        Thought Content: Thought content normal.        Judgment: Judgment normal.     Comments: Very tearful discussing her weight concerns, how it makes her feel about herself, and changes since college graduation. Long-term boyfriend living in [place] currently as well.        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  She is already following with endocrinology and they have a plan in place for next follow-up. She states that she has been taking her medications as she should be. They will adjust levothyroxine accordingly. I think a nutritionist would be very helpful for her at this point especially with her diabetes and concerns about her weight.  I sent a referral. I encouraged patient to start incorporating weight lifting at least 2 times per week. ?Look into a personal trainer and/ or GROUP fitness classes to help with a sense of community.  Look into [employer] App for personal counseling. She knows to reach out if any other concerns.   Return in about 4 months (around 11/25/2021) for recheck .  This note was prepared with assistance of Conservation officer, historic buildings. Occasional wrong-word or sound-a-like substitutions may have occurred due to the inherent limitations of voice recognition software.  Time Spent: 38 minutes of total time was spent on the date of the encounter performing the following actions: chart review prior to seeing the patient, obtaining history, performing a medically necessary exam, counseling on the treatment plan, placing orders, and documenting in our EHR.       Yousof Alderman M Wylie Coon, PA-C

## 2021-07-29 ENCOUNTER — Other Ambulatory Visit (HOSPITAL_COMMUNITY): Payer: Self-pay

## 2021-08-08 ENCOUNTER — Other Ambulatory Visit (HOSPITAL_COMMUNITY): Payer: Self-pay

## 2021-08-11 ENCOUNTER — Telehealth: Payer: Self-pay | Admitting: Pharmacy Technician

## 2021-08-11 ENCOUNTER — Other Ambulatory Visit (HOSPITAL_COMMUNITY): Payer: Self-pay

## 2021-08-11 NOTE — Telephone Encounter (Signed)
Patient Advocate Encounter   Received notification from CoverMyMeds that prior authorization for Melissa Reese is due for renewal.   PA submitted on 08/11/21 Key  [id number] Status is pending    [facility] will continue to follow:

## 2021-08-12 ENCOUNTER — Other Ambulatory Visit (HOSPITAL_COMMUNITY): Payer: Self-pay

## 2021-08-12 NOTE — Telephone Encounter (Signed)
Patient Advocate Encounter  Prior Authorization for Claiborne Billings has been approved.    PA# Key: [id number] Effective dates: 08/12/2021 through 08/12/2022  Patients co-pay is $75.

## 2021-08-14 ENCOUNTER — Other Ambulatory Visit (HOSPITAL_COMMUNITY): Payer: Self-pay

## 2021-09-01 DIAGNOSIS — E1040 Type 1 diabetes mellitus with diabetic neuropathy, unspecified: Secondary | ICD-10-CM | POA: Diagnosis not present

## 2021-09-01 DIAGNOSIS — E1065 Type 1 diabetes mellitus with hyperglycemia: Secondary | ICD-10-CM | POA: Diagnosis not present

## 2021-09-01 DIAGNOSIS — E1042 Type 1 diabetes mellitus with diabetic polyneuropathy: Secondary | ICD-10-CM | POA: Diagnosis not present

## 2021-09-21 ENCOUNTER — Other Ambulatory Visit: Payer: Self-pay | Admitting: Internal Medicine

## 2021-09-21 ENCOUNTER — Other Ambulatory Visit: Payer: Self-pay | Admitting: Physician Assistant

## 2021-09-21 DIAGNOSIS — Z3041 Encounter for surveillance of contraceptive pills: Secondary | ICD-10-CM

## 2021-09-22 ENCOUNTER — Other Ambulatory Visit (HOSPITAL_COMMUNITY): Payer: Self-pay

## 2021-09-22 MED ORDER — NORGESTIM-ETH ESTRAD TRIPHASIC 0.18/0.215/0.25 MG-35 MCG PO TABS
1.0000 | ORAL_TABLET | Freq: Every day | ORAL | 0 refills | Status: DC
  Filled 2021-09-22: qty 84, 84d supply, fill #0

## 2021-09-22 MED ORDER — LEVOTHYROXINE SODIUM 112 MCG PO TABS
112.0000 ug | ORAL_TABLET | Freq: Every day | ORAL | 0 refills | Status: DC
  Filled 2021-09-22: qty 45, 45d supply, fill #0

## 2021-10-17 ENCOUNTER — Encounter: Payer: Self-pay | Admitting: Internal Medicine

## 2021-10-17 ENCOUNTER — Ambulatory Visit: Payer: 59 | Admitting: Internal Medicine

## 2021-10-17 VITALS — BP 122/74 | HR 68 | Ht 62.0 in | Wt 158.0 lb

## 2021-10-17 DIAGNOSIS — E1042 Type 1 diabetes mellitus with diabetic polyneuropathy: Secondary | ICD-10-CM | POA: Diagnosis not present

## 2021-10-17 DIAGNOSIS — E039 Hypothyroidism, unspecified: Secondary | ICD-10-CM | POA: Diagnosis not present

## 2021-10-17 DIAGNOSIS — E1065 Type 1 diabetes mellitus with hyperglycemia: Secondary | ICD-10-CM

## 2021-10-17 LAB — LIPID PANEL
Cholesterol: 186 mg/dL (ref 0–200)
HDL: 54.2 mg/dL (ref >39.00)
LDL Cholesterol: 107 mg/dL — ABNORMAL HIGH (ref 0–99)
NonHDL: 131.58
Total CHOL/HDL Ratio: 3
Triglycerides: 121 mg/dL (ref 0.0–149.0)
VLDL: 24.2 mg/dL (ref 0.0–40.0)

## 2021-10-17 LAB — TSH: TSH: 0.33 u[IU]/mL — ABNORMAL LOW (ref 0.35–5.50)

## 2021-10-17 LAB — T4, FREE: Free T4: 1.09 ng/dL (ref 0.60–1.60)

## 2021-10-17 LAB — POCT GLYCOSYLATED HEMOGLOBIN (HGB A1C): Hemoglobin A1C: 7.3 % — AB (ref 4.0–5.6)

## 2021-10-17 LAB — MICROALBUMIN / CREATININE URINE RATIO
Creatinine,U: 109 mg/dL
Microalb Creat Ratio: 0.7 mg/g (ref 0.0–30.0)
Microalb, Ur: 0.8 mg/dL (ref 0.0–1.9)

## 2021-10-17 NOTE — Patient Instructions (Addendum)
Please use: - basal rates: 12 am: 1.20 >> 1.25 3 am: 1.15 6 am: 1.20 11 am: 1.20 5 pm: 1.15 9 pm: 1.30 - ICR:   12 am: 9  5 am: 7 >> 6  - target:  12 am: 120-120 5:30 am: 115-115 9 pm: 120-120 - ISF:  12 am: 45  5:30 am: 45  9 pm: 45 >> 55 - Insulin on Board: 4h - bolus wizard: on  Calibrate the sensor 2-4 times a day. You may need a temporary basal rate of 80% for the night after exercise. Change the site/tubing/insulin every 3-4 days: Sun and Wed.   Try to start the meal with fiber, protein and fat and end with carbs.  Please do the following right before a meal: - Enter all carbs (C) - Enter sugars (S) - Start insulin bolus (I)      Look up iLet insulin pump.  Please continue Levothyroxine 112 mcg daily.  Take the thyroid hormone every day, with water, at least 30 minutes before breakfast, separated by at least 4 hours from: - acid reflux medications - calcium - iron - multivitamins  Please return in 4 months.

## 2021-10-17 NOTE — Progress Notes (Unsigned)
Patient ID: Melissa Reese, female   DOB: [birth year], 21 y.o.   MRN: [id number]   HPI: Melissa Reese is a 21 y.o.-year-old very pleasant female, initially referred by her PCP, Dr. Earlene Plater, returning for follow-up for DM1  Dx 2003, uncontrolled, with complications (PN). Prev. seen at [facility] clinic. Last visit with me 6 months ago.  Interim history: No increased urination, blurry vision, nausea, chest pain.   At last visit, I recommended to switch to the OmniPod 5 insulin pump with a Dexcom CGM.  However, this system was not covered for her so at today's visit, she is still on Medtronic system. She did change the pump before last visit (same mother) She started using a walking pad at work before last visit >> started to walk 8000-10000 steps a day. She also packs her lunches more. She is not working full-time at [employer]. She mentions that since last visit she did not have any more low blood sugars in the morning so she did not have to eat or drink as soon as she woke up.  Insulin pump: - Started at 21 years old - OneTouch ping - started 01/2015 - Medtronic 670 G insulin pump + guardian CGM-started 09/2016 (band aid to keep the CGM in place: Simplatch). She had a crack in her pump -received another pump 01/2018.   - Most recent: Medtronic 770 G started 01/2019 - renewed 03/2021.    Meter:  -Accu-Chek guide link  CGM: -Medtronic Guardian  Insulin: -FiAsp ([place] approved)  DM1:  Reviewed HbA1c levels: Lab Results  Component Value Date   HGBA1C 7.5 (A) 05/29/2021   HGBA1C 7.5 (A) 01/27/2021   HGBA1C 7.4 (A) 09/20/2020   HGBA1C 7.5 (A) 05/10/2020   HGBA1C 7.2 (A) 01/10/2020   HGBA1C 6.8 (A) 08/31/2019   HGBA1C 6.9 (A) 03/24/2019   HGBA1C 6.7 (A) 11/22/2018   HGBA1C 8.0 (A) 07/19/2018   HGBA1C 8.0 (A) 03/14/2018   HGBA1C 8.7 (H) 08/24/2017   HGBA1C 8.9 04/09/2017   HGBA1C 8.4 12/22/2016   HGBA1C 9.7 (H) 09/07/2016  Prev.  02/20/2016: HbA1c 7.2% 11/21/2015: HbA1c  7.4% 08/21/2015: HbA1c 7.7% 04/24/2015: HbA1c 7.8%  Pump settings: - basal rates: 12 am: 1.15 >> 23.20 3 am: 1.15 6 am: 1.30 >> 1.20 11 am: 1.20 5 pm: 1.15 9 pm: 1.25 >> 1.30 - ICR:   12 am: 9  5 am: 7  - target:  12 am: 120-120 5:30 am: 115-115 9 pm: 120-120 - ISF:  12 am: 45  5:30 am: 45  9 pm: 45 - Insulin on Board: 4h - bolus wizard: on Calibrate the sensor 2-4 times a day. You may need a temporary basal rate of 80% for the night after exercise.  TDD from basal insulin: 61% >> 69% >> 70% >> 64% TDD from bolus insulin: 39% >> 31% >> 30% >> 36% TDD 39 to 60 units units. - extended bolusing: not using - changes infusion site: Every every 4 to 5 days >> every 6-7 days >> every 2-4 days.   She checks her sugars more than 4 times a day with her CGM:     Previously:   Prev.:   Lowest sugar: 40 >> 45 >> 40s at the beach. She has hypoglycemia awareness at 60s.  She had previous visits to the ED for hypoglycemia years ago, not recently. Highest sugar: >400 >> >400s >> 292. No previous DKA admissions.  No CKD, last BUN/creatinine:  Lab Results  Component Value Date  BUN 11 01/27/2021   BUN 9 08/31/2019   CREATININE 0.89 01/27/2021   CREATININE 1.03 08/31/2019  Not on ACE inhibitor/ARB.  + HL; last set of lipids: Lab Results  Component Value Date   CHOL 175 01/27/2021   HDL 52.30 01/27/2021   LDLCALC 98 01/27/2021   TRIG 124.0 01/27/2021   CHOLHDL 3 01/27/2021  08/21/2015: 145/148/45/76 Not on a statin.  - latest eye exam: 06/2020: No DR - coming up in 11/2021.  - + numbness and tingling in her feet.  She has ingrown toenails.  Last foot exam 04/2021.  Hypothyroidism:  Pt is on levothyroxine 112 mcg daily, taken: - in am - fasting - at least 30 min from b'fast - no calcium - no iron - no multivitamins - no PPIs - not on Biotin  Reviewed her TFTs: Lab Results  Component Value Date   TSH 25.32 (H) 05/29/2021   TSH 0.25 (L) 01/27/2021    TSH 0.12 (L) 09/20/2020   TSH 25.79 (H) 05/10/2020   TSH 4.88 (H) 01/10/2020   TSH 7.24 (H) 08/31/2019   TSH 0.27 (L) 03/24/2019   TSH 0.02 (L) 01/03/2019   TSH 0.01 (L) 11/22/2018   TSH 0.47 07/19/2018  11/21/2015: TSH 6.397 08/21/2015: TSH 41.268 04/24/2015: TSH 14.788 08/16/2014: TSH 0.407  At last visit, we did not change her levothyroxine dose due to the 100x increase in TSH despite not changing the dose, reflecting incomplete compliance with levothyroxine  Pt denies: - feeling nodules in neck - hoarseness - dysphagia - choking  She started college in August 2018 >> May 2022. Sociology major.  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  Stopped [employer] shifts.  ROS: + see HPI  Neurological: no tremors/+ numbness/+ tingling/no dizziness  I reviewed pt's medications, allergies, PMH, social hx, family hx, and changes were documented in the history of present illness. Otherwise, unchanged from my initial visit note.  Past Medical History:  Diagnosis Date   Diabetes mellitus without complication ([place])    Thyroid disease     Social History   Social History   Marital status: Single    Spouse name: N/A   Number of children: 0   Occupational History   student   Social History Main Topics   Smoking status: Never Smoker   Smokeless tobacco: Never Used   Alcohol use No   Drug use: No   Current Outpatient Medications on File Prior to Visit  Medication Sig Dispense Refill   Continuous Blood Gluc Transmit (DEXCOM G6 TRANSMITTER) MISC Use every 3 (three) months. 1 each 3   COVID-19 At Home Antigen Test (CARESTART COVID-19 HOME TEST) KIT Use as directed 4 each 0   FIASP 100 UNIT/ML SOLN USE UP TO 70 UNITS PER DAY IN THE PUMP. 60 mL 3   Glucagon 3 MG/DOSE POWD PLACE 3 MG INTO THE NOSE ONCE AS NEEDED FOR UP TO 1 DOSE. 1 each 11   glucose blood (ACCU-CHEK GUIDE) test strip For use with insulin pump to check blood sugar 4 times a day. 400 each 12   insulin  detemir (LEVEMIR) 100 UNIT/ML FlexPen Inject into the skin.     Insulin Disposable Pump (OMNIPOD 5 G6 INTRO, GEN 5,) KIT Use as directed 1 kit 0   Insulin Disposable Pump (OMNIPOD 5 G6 POD, GEN 5,) MISC Use as directed every 3 (three) days. 30 each 3   Insulin Lispro-aabc (LYUMJEV) 100 UNIT/ML SOLN Use in insulin pump up to 70 units  a day 60 mL 3   levothyroxine (SYNTHROID) 112 MCG tablet Take 1 tablet (112 mcg total) by mouth daily. 45 tablet 0   Norgestimate-Ethinyl Estradiol Triphasic (TRI-VYLIBRA) 0.18/0.215/0.25 MG-35 MCG tablet Take 1 tablet by mouth daily. 84 tablet 0   No current facility-administered medications on file prior to visit.   No Known Allergies Family History  Problem Relation Age of Onset   Allergies Father    PE: BP 122/74 (BP Location: Right Arm, Patient Position: Sitting, Cuff Size: Normal)   Pulse 68   Ht _0  (1.575 m)   Wt 158 lb (71.7 kg)   SpO2 99%   BMI 28.90 kg/m  Wt Readings from Last 3 Encounters:  10/17/21 158 lb (71.7 kg)  07/25/21 157 lb 9.6 oz (71.5 kg)  05/29/21 158 lb 9.6 oz (71.9 kg)   Constitutional: normal weight, in NAD Eyes: EOMI, no exophthalmos ENT: moist mucous membranes, no thyromegaly, no cervical lymphadenopathy Cardiovascular: RRR, No MRG Respiratory: CTA B Musculoskeletal: no deformities Skin: moist, warm, no rashes Neurological: no tremor with outstretched hands ASSESSMENT: 1. DM1, uncontrolled, with complications - PN  2. Hypothyroidism  PLAN:  1. Patient with longstanding, uncontrolled, type 1 diabetes, on insulin pump therapy.  She switched to a Medtronic 770 G insulin pump in 01/2019.  This is integrated with the Medtronic Guardian sensor.  We tried to switch to the OmniPod 5 system but this was not covered for her.  She did obtain a new Medtronic 770 G insulin pump 2 months prior to our last visit and she also got a new guardian sensor.  She is still interested in the OmniPod pump and would very much like to have a  tubeless pump. -At last visit, sugars still appear to be high overnight and I again recommended to increase basal rates during the night since she did not do this as recommended at the previous visit.  Sugars appears to be fluctuating during the day due to not entering enough carbs into the pump and not bolusing before meals, but rather after the sugars were already high after meals.  When she was bolusing after meals she was dropping her blood sugars and probably.  I advised her that ideally she would bolus before the meals, but if she had to bolus after a meal, to just do a correction, rather than entering the entire amount of carbs for the meal.  She was still not bolusing enough and we did not change her bolusing parameters pending better compliance with the boluses. -At last visit, she started to walk more - got a walking desk. CGM interpretation: -At today's visit, we reviewed her CGM downloads: It appears that 65% of values are in target range (goal >70%), while 33% are higher than 180 (goal <25%), and 2% are lower than 70 (goal <4%).  The calculated average blood sugar is 156.  The projected HbA1c for the next 3 months (GMI) is 7%. -Reviewing the CGM trends, sugars appear to have improved since last visit.  She is staying more in the morning and does a good job bolusing before meals.  However, she is dropping her sugars after the boluses and upon questioning, if the sugars are slightly high before the meals, she injects Fiasp about 30 minutes before she starts eating.  We discussed that Claiborne Billings is ultra rapid acting insulin and should not be injecting more than 15 minutes before the meal, if sugars are high, but otherwise, start the bolus at the start of the  meal.  Even if she boluses correctly, most of the blood sugars are higher after meals so I advised her to strengthen her insulin to carb ratios with her meals. -Since her sugars occasionally correcting too much after she does a correction at night, I  advised her to relax her insulin sensitivity factor after 9 PM.  However, if the sugars are currently higher overnight and they are very slow to improve, I advised her to increase her basal rates from 12 AM to 3 AM, in case she is out of the automatic mode. -She mentions that since she did not have as much hypoglycemia overnight as before, she did not have to eat or drink orange juice in the morning anymore. -We also discussed about the iLet insulin pump, which was recently cleared by FDA and she would interested to try it.  Discussed about differences with the current system.  We will try to get it for her next year. -I advised her to:  Patient Instructions  Please use: - basal rates: 12 am: 1.20 >> 1.25 3 am: 1.15 6 am: 1.20 11 am: 1.20 5 pm: 1.15 9 pm: 1.30 - ICR:   12 am: 9  5 am: 7 >> 6  - target:  12 am: 120-120 5:30 am: 115-115 9 pm: 120-120 - ISF:  12 am: 45  5:30 am: 45  9 pm: 45 >> 55 - Insulin on Board: 4h - bolus wizard: on  Calibrate the sensor 2-4 times a day. You may need a temporary basal rate of 80% for the night after exercise. Change the site/tubing/insulin every 3-4 days: Sun and Wed.   Try to start the meal with fiber, protein and fat and end with carbs.  Please do the following right before a meal: - Enter all carbs (C) - Enter sugars (S) - Start insulin bolus (I)      Look up iLet insulin pump.  Please continue Levothyroxine 112 mcg daily.  Take the thyroid hormone every day, with water, at least 30 minutes before breakfast, separated by at least 4 hours from: - acid reflux medications - calcium - iron - multivitamins  Please return in 4 months.   - we checked her HbA1c: 7.3% (a little lower) - advised to check sugars at different times of the day - 4x a day, rotating check times - advised for yearly eye exams >> she is not UTD but has an appointment scheduled - will check annual labs today - return to clinic in 4 months  2.  Hypothyroidism -She has a history of poorly controlled hypothyroidism with fluctuating TFTs due to incomplete compliance with levothyroxine -Last check, her TSH increased to 100 times from the previous check, despite not changing her LT4 dose: Lab Results  Component Value Date   TSH 25.32 (H) 05/29/2021  -She states she levothyroxine daily, but before last visit, she had more hypoglycemic values in the morning and had to eat or drink orange juice in the morning.  This was most likely the reason for her decreased absorption of levothyroxine and increased TSH  -  she continues on LT4 112 mcg daily - we discussed about the importance of not missing levothyroxine tablets and if she absolutely misses a tablet to take 2 the next day. - I recommended taking the thyroid hormone every day, with water, >30 minutes before breakfast, separated by >4 hours from acid reflux medications, calcium, iron, multivitamins. Pt. is taking it correctly. - will check thyroid tests today: TSH and  fT4 - If labs are abnormal, she will need to return for repeat TFTs in 1.5 months  Total time spent for the visit: 40 min, in reviewing her pump downloads, discussing her hypo- and hyper-glycemic episodes, reviewing previous labs and pump settings and developing a plan to avoid hypo- and hyper-glycemia.  We also addressed her hypothyroidism, which is uncontrolled.  Philemon Kingdom, MD PhD [facility]

## 2021-10-18 LAB — COMPLETE METABOLIC PANEL WITH GFR
AG Ratio: 1.4 (calc) (ref 1.0–2.5)
ALT: 7 U/L (ref 6–29)
AST: 11 U/L (ref 10–30)
Albumin: 4.1 g/dL (ref 3.6–5.1)
Alkaline phosphatase (APISO): 58 U/L (ref 31–125)
BUN: 11 mg/dL (ref 7–25)
CO2: 23 mmol/L (ref 20–32)
Calcium: 9.3 mg/dL (ref 8.6–10.2)
Chloride: 104 mmol/L (ref 98–110)
Creat: 0.9 mg/dL (ref 0.50–0.96)
Globulin: 2.9 g/dL (calc) (ref 1.9–3.7)
Glucose, Bld: 138 mg/dL — ABNORMAL HIGH (ref 65–99)
Potassium: 4.3 mmol/L (ref 3.5–5.3)
Sodium: 137 mmol/L (ref 135–146)
Total Bilirubin: 0.4 mg/dL (ref 0.2–1.2)
Total Protein: 7 g/dL (ref 6.1–8.1)
eGFR: 92 mL/min/{1.73_m2} (ref >=60)

## 2021-10-24 ENCOUNTER — Encounter: Payer: 59 | Attending: Internal Medicine | Admitting: Dietician

## 2021-10-24 ENCOUNTER — Encounter: Payer: Self-pay | Admitting: Dietician

## 2021-10-24 DIAGNOSIS — E1065 Type 1 diabetes mellitus with hyperglycemia: Secondary | ICD-10-CM | POA: Diagnosis not present

## 2021-10-24 DIAGNOSIS — E1042 Type 1 diabetes mellitus with diabetic polyneuropathy: Secondary | ICD-10-CM | POA: Insufficient documentation

## 2021-10-24 NOTE — Patient Instructions (Addendum)
When you take your Exercise class,  You may need a temporary basal rate of 80% for the night after exercise. You may need to suspend insulin prior to your class or eat a snack with protein and carbohydrates. Be sure you have fast acting carbs in the event of a low.   Consistently count carbohydrates Great job being more active  Review the resources provided.  Consider MVI.

## 2021-10-24 NOTE — Progress Notes (Signed)
Diabetes Self-Management Education  Visit Type: First/Initial  Appt. Start Time: 1340 Appt. End Time: 1500  10/24/2021  [name], identified by name and date of birth, is a 21 y.o. female with a diagnosis of Diabetes: Type 1.   ASSESSMENT Patient is here today alone.   She last met with a dietitian in middle or high school. She would like to learn more about what to eat and how to correct her blood glucose.  She is trying to lose a little weight. She is interested in being added to the Type 1 Support Group - added her today.   She is interested in local JDRF events. Included Mastering Diabetes on Resource page list.  Blood glucose decreased to 60 during this visit.  Provided 7 oz of regular Coke and blood glucose rose to 73, provided peanut butter and graham crackers and blood glucose was 109 prior to leaving. She states that she bolused for carbs in a Chick filet meal (45 grams total but the beverage had sugar alcohol so bolused for 35 grams).  She states that she does get lows after meals at times.  Discussed carb counting accuracy and to discuss need for I:C ratio change with MD if this continues.  She asked if she should take a MVI.  She states that she wonders if she has anemia.  Noted no recent hemoglobin.  Discussed that she could try a MVI to see if this would make her feel better.  History includes:  Type 1 Diabetes (2003), hypothyroidism , PN Medications include:  Fiasp in pump, synthroid, birth control pill Labs noted to include:  7.3% 10/17/2021 decreased from 7.5% 05/29/2021.  LDL 107 10/17/2021 CGM:  Guardian Connects Insulin Pump:  Medtronic Sleep:  good/very good  Weight hx: 158 lbs 10/24/2021 167 lbs 2018 highest adult weight.  Lost to 140 lbs during covid (strict low carb not keto")  and has steadily increased.  Patient lives with her mother.  They share shopping and cooking. Patient has a BS degree in sociology and is now working on her Master's in  business and Programmer, applications. She is working full time as a Airline pilot at [employer]. She has gotten a standing desk and walking pad. Aims to get 8,000-10,000 steps per day.  She is going to start a Systems developer class with her coworker 2 times per week.  Height '5\' 2"'  (1.575 m), weight 158 lb (71.7 kg). Body mass index is 28.9 kg/m.   Diabetes Self-Management Education - 10/24/21 1347       Visit Information   Visit Type First/Initial      Initial Visit   Diabetes Type Type 1    Date Diagnosed 2003    Are you currently following a meal plan? No    Are you taking your medications as prescribed? Yes      Health Coping   How would you rate your overall health? Good      Psychosocial Assessment   Patient Belief/Attitude about Diabetes Motivated to manage diabetes   and sometimes burned out   What is the hardest part about your diabetes right now, causing you the most concern, or is the most worrisome to you about your diabetes?   Being active;Making healty food and beverage choices    Self-care barriers None    Self-management support Doctor's office    Other persons present Patient    Patient Concerns Nutrition/Meal planning    Special Needs None  Preferred Learning Style No preference indicated    Learning Readiness Ready    How often do you need to have someone help you when you read instructions, pamphlets, or other written materials from your doctor or pharmacy? 1 - Never    What is the last grade level you completed in school? Currently in Master's program      Pre-Education Assessment   Patient understands the diabetes disease and treatment process. Needs Review    Patient understands incorporating nutritional management into lifestyle. Needs Review    Patient undertands incorporating physical activity into lifestyle. Needs Review    Patient understands using medications safely. Needs Review    Patient understands monitoring blood  glucose, interpreting and using results Needs Review    Patient understands prevention, detection, and treatment of acute complications. Needs Review    Patient understands prevention, detection, and treatment of chronic complications. Needs Review    Patient understands how to develop strategies to address psychosocial issues. Needs Review    Patient understands how to develop strategies to promote health/change behavior. Needs Review      Complications   Last HgB A1C per patient/outside source 7.3 %   10/17/2021 decreased from 7.5% 05/29/2021   How often do you check your blood sugar? > 4 times/day    Fasting Blood glucose range (mg/dL) 130-179   140-160   Postprandial Blood glucose range (mg/dL) 180-200;130-179;>200    Number of hypoglycemic episodes per month 8    Can you tell when your blood sugar is low? Yes    What do you do if your blood sugar is low? drinks regular soda    Number of hyperglycemic episodes ( >259m/dL): Daily    Can you tell when your blood sugar is high? Yes    What do you do if your blood sugar is high? drinks more water and small walk    Have you had a dilated eye exam in the past 12 months? No   appointment in October   Have you had a dental exam in the past 12 months? Yes    Are you checking your feet? Yes    How many days per week are you checking your feet? 7      Dietary Intake   Breakfast protein bar OR applesauce OR protein yogurt    Snack (morning) none    Lunch leftovers OR chick fil A wrap or salad    Snack (afternoon) chips    Dinner rotissorie chicken, potatoes, green beans OR stoffer's lasagne and salad OR taco salad OR subway (6") tKuwaitand cheese    Snack (evening) occasional cheeze its OR ice cream (carb smart)    Beverage(s) water, diet lemonade, coffee with zer sugar creamer, rare diet coke, regular gingerale when blood glucose was low      Activity / Exercise   Activity / Exercise Type Light (walking / raking leaves)   8000 steps daily    How many days per week do you exercise? 7    How many minutes per day do you exercise? 30    Total minutes per week of exercise 210      Patient Education   Previous Diabetes Education Yes (please comment)   Junior or Senior highschool   Disease Pathophysiology Explored patient's options for treatment of their diabetes    Healthy Eating Plate Method;Carbohydrate counting;Food label reading, portion sizes and measuring food.;Meal timing in regards to the patients' current diabetes medication.;Meal options for control of blood glucose  level and chronic complications.;Role of diet in the treatment of diabetes and the relationship between the three main macronutrients and blood glucose level    Being Active Role of exercise on diabetes management, blood pressure control and cardiac health.;Identified with patient nutritional and/or medication changes necessary with exercise.    Medications Reviewed patients medication for diabetes, action, purpose, timing of dose and side effects.    Monitoring Yearly dilated eye exam;Daily foot exams    Acute complications Taught prevention, symptoms, and  treatment of hypoglycemia - the 15 rule.    Diabetes Stress and Support Worked with patient to identify barriers to care and solutions;Identified and addressed patients feelings and concerns about diabetes      Individualized Goals (developed by patient)   Nutrition Carb counting;Adjust meds/carbs with exercise as discussed    Physical Activity Exercise 5-7 days per week;30 minutes per day    Medications take my medication as prescribed    Monitoring  Consistenly use CGM    Problem Solving Eating Pattern    Reducing Risk do foot checks daily;examine blood glucose patterns;treat hypoglycemia with 15 grams of carbs if blood glucose less than 106m/dL      Post-Education Assessment   Patient understands the diabetes disease and treatment process. Demonstrates understanding / competency    Patient understands  incorporating nutritional management into lifestyle. Comprehends key points    Patient undertands incorporating physical activity into lifestyle. Demonstrates understanding / competency    Patient understands using medications safely. Demonstrates understanding / competency    Patient understands monitoring blood glucose, interpreting and using results Demonstrates understanding / competency    Patient understands prevention, detection, and treatment of acute complications. Demonstrates understanding / competency    Patient understands prevention, detection, and treatment of chronic complications. Demonstrates understanding / competency    Patient understands how to develop strategies to address psychosocial issues. Demonstrates understanding / competency    Patient understands how to develop strategies to promote health/change behavior. Comprehends key points      Outcomes   Expected Outcomes Demonstrated interest in learning. Expect positive outcomes    Future DMSE 3-4 months    Program Status Not Completed             Individualized Plan for Diabetes Self-Management Training:   Learning Objective:  Patient will have a greater understanding of diabetes self-management. Patient education plan is to attend individual and/or group sessions per assessed needs and concerns.   Plan:   Patient Instructions  When you take your Exercise class,  You may need a temporary basal rate of 80% for the night after exercise. You may need to suspend insulin prior to your class or eat a snack with protein and carbohydrates. Be sure you have fast acting carbs in the event of a low.   Consistently count carbohydrates Great job being more active  Review the resources provided.  Expected Outcomes:  Demonstrated interest in learning. Expect positive outcomes  Education material provided: ADA - How to Thrive: A Guide for Your Journey with Diabetes, Meal plan card, Snack sheet, and Diabetes  Resources  If problems or questions, patient to contact team via:  Phone  Future DSME appointment: 3-4 months

## 2021-11-10 ENCOUNTER — Other Ambulatory Visit: Payer: Self-pay | Admitting: Internal Medicine

## 2021-11-11 ENCOUNTER — Other Ambulatory Visit (HOSPITAL_COMMUNITY): Payer: Self-pay

## 2021-11-11 MED ORDER — LEVOTHYROXINE SODIUM 112 MCG PO TABS
112.0000 ug | ORAL_TABLET | Freq: Every day | ORAL | 0 refills | Status: DC
  Filled 2021-11-11: qty 30, 30d supply, fill #0

## 2021-11-17 DIAGNOSIS — H5213 Myopia, bilateral: Secondary | ICD-10-CM | POA: Diagnosis not present

## 2021-11-21 DIAGNOSIS — E1065 Type 1 diabetes mellitus with hyperglycemia: Secondary | ICD-10-CM | POA: Diagnosis not present

## 2021-11-21 DIAGNOSIS — E1042 Type 1 diabetes mellitus with diabetic polyneuropathy: Secondary | ICD-10-CM | POA: Diagnosis not present

## 2021-11-21 DIAGNOSIS — E1040 Type 1 diabetes mellitus with diabetic neuropathy, unspecified: Secondary | ICD-10-CM | POA: Diagnosis not present

## 2021-11-24 ENCOUNTER — Other Ambulatory Visit: Payer: Self-pay | Admitting: Physician Assistant

## 2021-11-24 ENCOUNTER — Encounter: Payer: Self-pay | Admitting: *Deleted

## 2021-11-24 DIAGNOSIS — Z3041 Encounter for surveillance of contraceptive pills: Secondary | ICD-10-CM

## 2021-11-25 ENCOUNTER — Other Ambulatory Visit (HOSPITAL_COMMUNITY): Payer: Self-pay

## 2021-11-25 MED ORDER — NORGESTIM-ETH ESTRAD TRIPHASIC 0.18/0.215/0.25 MG-35 MCG PO TABS
1.0000 | ORAL_TABLET | Freq: Every day | ORAL | 0 refills | Status: DC
  Filled 2021-11-25 – 2021-11-28 (×2): qty 84, 84d supply, fill #0

## 2021-11-26 DIAGNOSIS — E1065 Type 1 diabetes mellitus with hyperglycemia: Secondary | ICD-10-CM | POA: Diagnosis not present

## 2021-11-26 DIAGNOSIS — E1042 Type 1 diabetes mellitus with diabetic polyneuropathy: Secondary | ICD-10-CM | POA: Diagnosis not present

## 2021-11-26 DIAGNOSIS — E1040 Type 1 diabetes mellitus with diabetic neuropathy, unspecified: Secondary | ICD-10-CM | POA: Diagnosis not present

## 2021-11-28 ENCOUNTER — Encounter: Payer: Self-pay | Admitting: Physician Assistant

## 2021-11-28 ENCOUNTER — Other Ambulatory Visit (HOSPITAL_COMMUNITY): Payer: Self-pay

## 2021-11-28 ENCOUNTER — Ambulatory Visit: Payer: 59 | Admitting: Physician Assistant

## 2021-11-28 VITALS — BP 106/72 | HR 76 | Temp 97.7°F | Ht 62.0 in | Wt 155.8 lb

## 2021-11-28 DIAGNOSIS — Z23 Encounter for immunization: Secondary | ICD-10-CM

## 2021-11-28 DIAGNOSIS — E1042 Type 1 diabetes mellitus with diabetic polyneuropathy: Secondary | ICD-10-CM | POA: Diagnosis not present

## 2021-11-28 NOTE — Progress Notes (Signed)
   Subjective:    Patient ID: Melissa Reese, female    DOB: 2000/05/19, 21 y.o.   MRN: [id number]  Chief Complaint  Patient presents with   Follow-up    Pt in for 4 mon f/u; no concerns to discuss, pt states things are going well; had appt in August with Endo and A1C has came down some. Pt is making more effort to be more aware and make better choices and also met with nutritionist.      HPI Patient is in today for 4 month f/up. Pt states that she is feeling much better than last o/v.  She has changed gyms, now at a local gym with group classes. Goes 1-2 times per week, enjoys Zumba, has been doing this with a friend from work. Making better nutrition choices after seeing nutritionist. Feels like she is taking much better care of her health. Mentally feeling better as well. Needs flu shot today but no other concerns.   Past Medical History:  Diagnosis Date   Diabetes mellitus without complication ([place])    Thyroid disease     History reviewed. No pertinent surgical history.  Family History  Problem Relation Age of Onset   Allergies Father     Social History   Tobacco Use   Smoking status: Never   Smokeless tobacco: Never  Substance Use Topics   Alcohol use: No   Drug use: No     No Known Allergies  Review of Systems NEGATIVE UNLESS OTHERWISE INDICATED IN HPI      Objective:     BP 106/72 (BP Location: Left Arm)   Pulse 76   Temp 97.7 F (36.5 C) (Temporal)   Ht _0  (1.575 m)   Wt 155 lb 12.8 oz (70.7 kg)   LMP 11/18/2021 (Exact Date)   SpO2 99%   BMI 28.50 kg/m   Wt Readings from Last 3 Encounters:  11/28/21 155 lb 12.8 oz (70.7 kg)  10/24/21 158 lb (71.7 kg)  10/17/21 158 lb (71.7 kg)    BP Readings from Last 3 Encounters:  11/28/21 106/72  10/17/21 122/74  07/25/21 106/68     Physical Exam Constitutional:      Appearance: Normal appearance.  Cardiovascular:     Rate and Rhythm: Normal rate and regular rhythm.     Pulses: Normal pulses.      Heart sounds: Normal heart sounds. No murmur heard. Pulmonary:     Effort: Pulmonary effort is normal.     Breath sounds: Normal breath sounds.  Neurological:     General: No focal deficit present.     Mental Status: She is alert and oriented to person, place, and time.  Psychiatric:        Mood and Affect: Mood normal.        Behavior: Behavior normal.        Thought Content: Thought content normal.        Assessment & Plan:  Type 1 diabetes mellitus with diabetic polyneuropathy (HCC)  Need for immunization against influenza -     Flu Vaccine QUAD 71moIM (Fluarix, Fluzone & Alfiuria Quad PF)   Encouraged patient to keep up good work with lifestyle changes. Glad she is doing so much better. Keep regular f/up with nutrition and Dr. GCruzita Lederer Call if any concerns.   Sora Olivo M Chrishaun Sasso, PA-C

## 2021-12-09 ENCOUNTER — Other Ambulatory Visit: Payer: Self-pay | Admitting: Internal Medicine

## 2021-12-09 ENCOUNTER — Other Ambulatory Visit (HOSPITAL_COMMUNITY): Payer: Self-pay

## 2021-12-09 MED ORDER — LEVOTHYROXINE SODIUM 112 MCG PO TABS
112.0000 ug | ORAL_TABLET | Freq: Every day | ORAL | 5 refills | Status: DC
  Filled 2021-12-09: qty 30, 30d supply, fill #0
  Filled 2022-01-12: qty 90, 90d supply, fill #1
  Filled 2022-04-02: qty 60, 60d supply, fill #2

## 2022-01-12 ENCOUNTER — Other Ambulatory Visit: Payer: Self-pay | Admitting: Internal Medicine

## 2022-01-12 ENCOUNTER — Other Ambulatory Visit (HOSPITAL_COMMUNITY): Payer: Self-pay

## 2022-01-12 ENCOUNTER — Other Ambulatory Visit: Payer: Self-pay | Admitting: Endocrinology

## 2022-01-12 DIAGNOSIS — IMO0002 Reserved for concepts with insufficient information to code with codable children: Secondary | ICD-10-CM

## 2022-01-12 DIAGNOSIS — E1065 Type 1 diabetes mellitus with hyperglycemia: Secondary | ICD-10-CM

## 2022-01-12 MED ORDER — ACCU-CHEK GUIDE VI STRP
ORAL_STRIP | 12 refills | Status: DC
  Filled 2022-01-12 – 2022-01-13 (×3): qty 400, 90d supply, fill #0
  Filled 2022-01-13: qty 300, 75d supply, fill #0
  Filled 2022-01-13: qty 400, 90d supply, fill #0
  Filled 2022-04-02: qty 300, 75d supply, fill #1
  Filled 2022-04-02: qty 350, 87d supply, fill #1
  Filled 2022-04-02 – 2022-04-03 (×2): qty 300, 75d supply, fill #1

## 2022-01-12 MED ORDER — FIASP 100 UNIT/ML IJ SOLN
70.0000 [IU] | INTRAMUSCULAR | 3 refills | Status: AC
  Filled 2022-01-12: qty 60, 85d supply, fill #0
  Filled 2022-04-02: qty 20, 28d supply, fill #1
  Filled 2022-04-03 – 2022-05-28 (×3): qty 60, 85d supply, fill #1
  Filled 2022-09-02: qty 60, 85d supply, fill #2
  Filled 2022-12-03: qty 40, 57d supply, fill #3

## 2022-01-13 ENCOUNTER — Other Ambulatory Visit (HOSPITAL_COMMUNITY): Payer: Self-pay

## 2022-01-14 ENCOUNTER — Other Ambulatory Visit (HOSPITAL_COMMUNITY): Payer: Self-pay

## 2022-01-15 ENCOUNTER — Other Ambulatory Visit (HOSPITAL_COMMUNITY): Payer: Self-pay

## 2022-02-10 DIAGNOSIS — E1065 Type 1 diabetes mellitus with hyperglycemia: Secondary | ICD-10-CM | POA: Diagnosis not present

## 2022-02-10 DIAGNOSIS — E1040 Type 1 diabetes mellitus with diabetic neuropathy, unspecified: Secondary | ICD-10-CM | POA: Diagnosis not present

## 2022-02-10 DIAGNOSIS — E1042 Type 1 diabetes mellitus with diabetic polyneuropathy: Secondary | ICD-10-CM | POA: Diagnosis not present

## 2022-02-11 ENCOUNTER — Other Ambulatory Visit (HOSPITAL_COMMUNITY): Payer: Self-pay

## 2022-02-11 ENCOUNTER — Telehealth: Payer: Self-pay

## 2022-02-11 NOTE — Telephone Encounter (Signed)
Patient Advocate Encounter   Received notification from [facility] that prior authorization is required for [facility] G6 transmitter  Test claim was successful. PA not needed at this time.   Nothing further required

## 2022-02-12 ENCOUNTER — Other Ambulatory Visit (HOSPITAL_COMMUNITY): Payer: Self-pay

## 2022-02-12 MED ORDER — HYDROCODONE-ACETAMINOPHEN 5-325 MG PO TABS
1.0000 | ORAL_TABLET | Freq: Four times a day (QID) | ORAL | 0 refills | Status: DC | PRN
  Filled 2022-02-12: qty 8, 2d supply, fill #0

## 2022-02-12 MED ORDER — IBUPROFEN 600 MG PO TABS
600.0000 mg | ORAL_TABLET | Freq: Four times a day (QID) | ORAL | 0 refills | Status: DC | PRN
  Filled 2022-02-12: qty 20, 4d supply, fill #0

## 2022-02-12 MED ORDER — AMOXICILLIN 500 MG PO CAPS
500.0000 mg | ORAL_CAPSULE | Freq: Three times a day (TID) | ORAL | 0 refills | Status: DC
  Filled 2022-02-12: qty 21, 7d supply, fill #0

## 2022-02-13 ENCOUNTER — Other Ambulatory Visit (HOSPITAL_COMMUNITY): Payer: Self-pay

## 2022-02-17 ENCOUNTER — Ambulatory Visit: Payer: 59 | Admitting: Internal Medicine

## 2022-02-17 ENCOUNTER — Encounter: Payer: Self-pay | Admitting: Internal Medicine

## 2022-02-17 ENCOUNTER — Encounter: Payer: 59 | Admitting: Dietician

## 2022-02-17 VITALS — BP 128/82 | HR 123 | Ht 62.0 in | Wt 162.4 lb

## 2022-02-17 DIAGNOSIS — E1065 Type 1 diabetes mellitus with hyperglycemia: Secondary | ICD-10-CM

## 2022-02-17 DIAGNOSIS — E1042 Type 1 diabetes mellitus with diabetic polyneuropathy: Secondary | ICD-10-CM

## 2022-02-17 DIAGNOSIS — E039 Hypothyroidism, unspecified: Secondary | ICD-10-CM

## 2022-02-17 LAB — POCT GLYCOSYLATED HEMOGLOBIN (HGB A1C): Hemoglobin A1C: 7.6 % — AB (ref 4.0–5.6)

## 2022-02-17 LAB — TSH: TSH: 2.99 u[IU]/mL (ref 0.35–5.50)

## 2022-02-17 LAB — T4, FREE: Free T4: 1.19 ng/dL (ref 0.60–1.60)

## 2022-02-17 NOTE — Progress Notes (Signed)
Patient ID: Melissa Reese, female   DOB: [birth year], 21 y.o.   MRN: [id number]   HPI: Melissa Reese is a 21 y.o.-year-old very pleasant female, initially referred by her PCP, Dr. Juleen China, returning for follow-up for DM1  Dx 2003, uncontrolled, with complications (PN). Prev. seen at [facility]. Last visit with me 4 months ago.  Interim history: No increased urination, blurry vision, nausea, chest pain.   Before last visit: She started using a walking pad at work before last visit >> started to walk 8000-10000 steps a day. She also packs her lunches more. Since last visit, she goes to the gym consistently and doing Zumba. She had wisdom teeth surgery 4 days ago  - eats pureed foods. On ABx now.  Insulin pump: - Started at 21 years old - OneTouch ping - started 01/2015 - Medtronic 670 G insulin pump + guardian CGM-started 09/2016 (band aid to keep the CGM in place: Simplatch). She had a crack in her pump -received another pump 01/2018.   - now YOMAYOKHT 977 G started 01/2019 - renewed 03/2021.   The OmniPod 5 pump was not covered by her insurance.  Meter:  -Accu-Chek guide link  CGM: -Medtronic Guardian  Insulin: -FiAsp ([place] approved)  DM1:  Reviewed HbA1c levels: Lab Results  Component Value Date   HGBA1C 7.3 (A) 10/17/2021   HGBA1C 7.5 (A) 05/29/2021   HGBA1C 7.5 (A) 01/27/2021   HGBA1C 7.4 (A) 09/20/2020   HGBA1C 7.5 (A) 05/10/2020   HGBA1C 7.2 (A) 01/10/2020   HGBA1C 6.8 (A) 08/31/2019   HGBA1C 6.9 (A) 03/24/2019   HGBA1C 6.7 (A) 11/22/2018   HGBA1C 8.0 (A) 07/19/2018   HGBA1C 8.0 (A) 03/14/2018   HGBA1C 8.7 (H) 08/24/2017   HGBA1C 8.9 04/09/2017   HGBA1C 8.4 12/22/2016   HGBA1C 9.7 (H) 09/07/2016  Prev.  02/20/2016: HbA1c 7.2% 11/21/2015: HbA1c 7.4% 08/21/2015: HbA1c 7.7% 04/24/2015: HbA1c 7.8%  Pump settings: - basal rates: 12 am: 1.20 >> 1.25 3 am: 1.15 6 am: 1.20 11 am: 1.20 5 pm: 1.15 9 pm: 1.30 - ICR:   12 am: 9  5 am: 7 >> 6  3  pm: 7   - target:  12 am: 120-120 5:30 am: 115-115 9 pm: 120-120 - ISF:  12 am: 45  5:30 am: 45 >> 55  9:30 pm: 45 >> 55 >> 45 - Insulin on Board: 4h - bolus wizard: on Calibrate the sensor 2-4 times a day. You may need a temporary basal rate of 80% for the night after exercise.  TDD from basal insulin: 61% >> 69% >> 70% >> 64% >> 71% TDD from bolus insulin: 39% >> 31% >> 30% >> 36% >> 29% TDD 39 to 60 units units. - extended bolusing: not using - changes infusion site: Every every 4 to 5 days >> every 6-7 days >> every 2-4 days.   She checks her sugars more than 4 times a day with her CGM:  Previously:    Lowest sugar: 45 >> 40s at the beach >> 50s. She has hypoglycemia awareness at 37s.  She had previous visits to the ED for hypoglycemia years ago, not recently. Highest sugar:  >400s >> 292 >> 300 (suspended the pump before surgery). No previous DKA admissions.  No CKD, last BUN/creatinine:  Lab Results  Component Value Date   BUN 11 10/17/2021   BUN 11 01/27/2021   CREATININE 0.90 10/17/2021   CREATININE 0.89 01/27/2021  Not on ACE inhibitor/ARB.  + HL;  last set of lipids: Lab Results  Component Value Date   CHOL 186 10/17/2021   HDL 54.20 10/17/2021   LDLCALC 107 (H) 10/17/2021   TRIG 121.0 10/17/2021   CHOLHDL 3 10/17/2021  08/21/2015: 145/148/45/76 Not on a statin.  - latest eye exam: 11/17/2021: No DR.  - + numbness and tingling in her feet.  She has ingrown toenails.  Last foot exam 04/2021.  Hypothyroidism:  Pt is on levothyroxine 112 mcg daily, taken: - in am - fasting - at least 30 min from b'fast - no calcium - no iron - no multivitamins - no PPIs - not on Biotin  Reviewed her TFTs: Lab Results  Component Value Date   TSH 0.33 (L) 10/17/2021   TSH 25.32 (H) 05/29/2021   TSH 0.25 (L) 01/27/2021   TSH 0.12 (L) 09/20/2020   TSH 25.79 (H) 05/10/2020   TSH 4.88 (H) 01/10/2020   TSH 7.24 (H) 08/31/2019   TSH 0.27 (L) 03/24/2019   TSH  0.02 (L) 01/03/2019   TSH 0.01 (L) 11/22/2018  11/21/2015: TSH 6.397 08/21/2015: TSH 41.268 04/24/2015: TSH 14.788 08/16/2014: TSH 0.407  At last visit, we did not change her levothyroxine dose due to the 100x increase in TSH despite not changing the dose, reflecting incomplete compliance with levothyroxine  Pt denies: - feeling nodules in neck - hoarseness - dysphagia - choking  She started college in August 2018 >> May 2022. Sociology major.  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  Stopped [employer] shifts.  ROS: + see HPI   I reviewed pt's medications, allergies, PMH, social hx, family hx, and changes were documented in the history of present illness. Otherwise, unchanged from my initial visit note.  Past Medical History:  Diagnosis Date   Diabetes mellitus without complication ([place])    Thyroid disease     Social History   Social History   Marital status: Single    Spouse name: N/A   Number of children: 0   Occupational History   student   Social History Main Topics   Smoking status: Never Smoker   Smokeless tobacco: Never Used   Alcohol use No   Drug use: No   Current Outpatient Medications on File Prior to Visit  Medication Sig Dispense Refill   amoxicillin (AMOXIL) 500 MG capsule Take 1 capsule (500 mg total) by mouth every 8 (eight) hours until gone 21 capsule 0   Continuous Blood Gluc Transmit (DEXCOM G6 TRANSMITTER) MISC Use every 3 (three) months. 1 each 3   COVID-19 At Home Antigen Test (CARESTART COVID-19 HOME TEST) KIT Use as directed 4 each 0   FIASP 100 UNIT/ML SOLN Use up to 70 units per day in insulin pump 60 mL 3   Glucagon 3 MG/DOSE POWD PLACE 3 MG INTO THE NOSE ONCE AS NEEDED FOR UP TO 1 DOSE. 1 each 11   glucose blood (ACCU-CHEK GUIDE) test strip use to check blood sugar 4 times a day. 400 each 12   HYDROcodone-acetaminophen (NORCO/VICODIN) 5-325 MG tablet Take 1 tablet by mouth every 6 (six) hours as needed for pain 8 tablet  0   ibuprofen (ADVIL) 600 MG tablet Take 1 tablet (600 mg total) by mouth every 6 (six) hours as needed. 20 tablet 0   insulin detemir (LEVEMIR) 100 UNIT/ML FlexPen Inject into the skin.     Insulin Infusion Pump (MINIMED 770G INSULIN PUMP SYS) KIT by Does not apply route.     levothyroxine (SYNTHROID) 112 MCG  tablet Take 1 tablet (112 mcg total) by mouth daily. 30 tablet 5   Norgestimate-Ethinyl Estradiol Triphasic (TRI-VYLIBRA) 0.18/0.215/0.25 MG-35 MCG tablet Take 1 tablet by mouth daily. 84 tablet 0   No current facility-administered medications on file prior to visit.   No Known Allergies Family History  Problem Relation Age of Onset   Allergies Father    PE: BP 128/82 (BP Location: Left Arm, Patient Position: Sitting, Cuff Size: Normal)   Pulse (!) 123   Ht _0  (1.575 m)   Wt 162 lb 6.4 oz (73.7 kg)   BMI 29.70 kg/m  Wt Readings from Last 3 Encounters:  02/17/22 162 lb 6.4 oz (73.7 kg)  11/28/21 155 lb 12.8 oz (70.7 kg)  10/24/21 158 lb (71.7 kg)   Constitutional: normal weight, in NAD Eyes: EOMI, no exophthalmos ENT: no thyromegaly, no cervical lymphadenopathy Cardiovascular: RRR, No RG, + SEM 1/6 Respiratory: CTA B Musculoskeletal: no deformities Skin:  no rashes Neurological: no tremor with outstretched hands  ASSESSMENT: 1. DM1, uncontrolled, with complications - PN  2. Hypothyroidism  PLAN:  1. Patient with longstanding, uncontrolled, type 1 diabetes, on insulin pump therapy.  She is on the Medtronic 770 G insulin pump.  She was interested in the OmniPod pump and she would very much like to have a tubeless pump, however, this was not covered by her insurance.  At last visit, she was doing better after she started to get more exercise in-got a walking desk, and she was also doing a better job with the diet, packing her lunches.  Her sugars improved and HbA1c was slightly lower, and 7.3%. -Reviewing her pump downloads, she was doing a better job bolusing for her  meals and she had less hypoglycemia.  She was dropping her sugars after boluses and upon questioning, if the sugars were slightly high before the meals, she was injecting Fiasp approximately 30 minutes before starting eating.  We discussed that Claiborne Billings was ultra rapid acting insulin and should not be injected more than 10-15 minutes before meals, and only if sugars were high, otherwise to inject at the start of the meal.  Even if she was injecting correctly, her sugars were still high after meals asked her to strengthen her insulin to carb ratios during the day.  Sugars were higher overnight and I advised her to increase the basal rate from 12 AM to 3 AM, in case she was out of the automatic mode.  We also discussed about looking into the iLet insulin pump which was just cleared by FDA before the visit. CGM interpretation: -At today's visit, we reviewed her CGM downloads: It appears that 60% of values are in target range (goal >70%), while 41% are higher than 180 (goal <25%), and 1% are lower than 70 (goal <4%).  The calculated average blood sugar is 170.  The projected HbA1c for the next 3 months (GMI) is 7.5%.  She is in the pump manual mode 24% of the time and in the auto mode 72% time. -Reviewing the CGM trends, sugars appear to be quite fluctuating, with some improvement last few days, but overall, with higher blood sugars over night and after meals.  Upon reviewing pump downloads, she is not answering carbs before the meals consistently, sometimes entering them after meals, and most of the times underestimating the amount of carbs eaten.  We discussed the importance answer all the carbs into the pump and also, depending on the compensation of her meals, for high-protein meals to enter  50% of the proteins as carbs.  She does agree that she has not been doing such a great job with bolusing for meals in the last 2 weeks, with the holidays and also with her recent wisdom tooth surgery.  However, she is now at  home, for the holidays and better able to concentrate on her diabetes.  The only change that I suggested today was to strengthen her insulin to carb ratio with dinner, since she did not answer this correctly at last visit -I advised her to:  Patient Instructions  Please use: - basal rates: 12 am: 1.25 3 am: 1.15 6 am: 1.20 11 am: 1.20 5 pm: 1.15 9 pm: 1.30 - ICR:   12 am: 9  5 am: 6  3 pm : 7 >> 6 - target:  12 am: 120-120 5:30 am: 115-115 9 pm: 120-120 - ISF:  12 am: 45  5:30 am: 45  9:30 pm: 45 - Insulin on Board: 4h - bolus wizard: on  Calibrate the sensor 2-4 times a day. You may need a temporary basal rate of 80% for the night after exercise. Change the site/tubing/insulin every 3-4 days: Sun and Wed.  Try to start the meal with fiber, protein and fat and end with carbs.   If you have a high protein meal, add 50% of the amount as carbs.  Please let me know if I can send the Omnipod Rx after the 1st of the year.  Please do the following right before a meal: - Enter all carbs (C) - Enter sugars (S) - Start insulin bolus (I)    Please continue Levothyroxine 112 mcg daily.  Take the thyroid hormone every day, with water, at least 30 minutes before breakfast, separated by at least 4 hours from: - acid reflux medications - calcium - iron - multivitamins    Please stop at the lab.  Please return in 4 months.  - we checked her HbA1c: 7.6% (higher) - advised to check sugars at different times of the day - 4x a day, rotating check times - advised for yearly eye exams >> she is UTD - return to clinic in 4 months  2. Hypothyroidism -With history of poorly controlled hypothyroidism and fluctuating TFTs.  Incomplete compliance with levothyroxine - latest thyroid labs reviewed with pt. >> TSH was slightly suppressed, but we did not change her LT4 dose due to history of variability of her TFTs: Lab Results  Component Value Date   TSH 0.33 (L) 10/17/2021  - she  continues on LT4 112 mcg daily - pt feels good on this dose. - we discussed about taking the thyroid hormone every day, with water, >30 minutes before breakfast, separated by >4 hours from acid reflux medications, calcium, iron, multivitamins. Pt. is taking it correctly.  She was previously not able to leave 30 minutes between levothyroxine and drinking juice or eating due to history of hypoglycemia.  However, this improved so she is taking it correctly now. - will check thyroid tests today: TSH and fT4 - If labs are abnormal, she will need to return for repeat TFTs in 1.5 months  Has a 90 days supply of 112 LT4 at home.   Component     Latest Ref Rng 02/17/2022  TSH     0.35 - 5.50 uIU/mL 2.99   T4,Free(Direct)     0.60 - 1.60 ng/dL 1.19    Normal TFTs.  Philemon Kingdom, MD PhD [facility] Endocrinology

## 2022-02-17 NOTE — Patient Instructions (Addendum)
Please use: - basal rates: 12 am: 1.25 3 am: 1.15 6 am: 1.20 11 am: 1.20 5 pm: 1.15 9 pm: 1.30 - ICR:   12 am: 9  5 am: 6  3 pm : 7 >> 6 - target:  12 am: 120-120 5:30 am: 115-115 9 pm: 120-120 - ISF:  12 am: 45  5:30 am: 45  9:30 pm: 45 - Insulin on Board: 4h - bolus wizard: on  Calibrate the sensor 2-4 times a day. You may need a temporary basal rate of 80% for the night after exercise. Change the site/tubing/insulin every 3-4 days: Sun and Wed.  Try to start the meal with fiber, protein and fat and end with carbs.   If you have a high protein meal, add 50% of the amount as carbs.  Please let me know if I can send the Omnipod Rx after the 1st of the year.  Please do the following right before a meal: - Enter all carbs (C) - Enter sugars (S) - Start insulin bolus (I)    Please continue Levothyroxine 112 mcg daily.  Take the thyroid hormone every day, with water, at least 30 minutes before breakfast, separated by at least 4 hours from: - acid reflux medications - calcium - iron - multivitamins    Please stop at the lab.  Please return in 4 months.

## 2022-02-19 DIAGNOSIS — E1065 Type 1 diabetes mellitus with hyperglycemia: Secondary | ICD-10-CM | POA: Diagnosis not present

## 2022-03-14 ENCOUNTER — Other Ambulatory Visit: Payer: Self-pay | Admitting: Physician Assistant

## 2022-03-14 DIAGNOSIS — Z3041 Encounter for surveillance of contraceptive pills: Secondary | ICD-10-CM

## 2022-03-16 ENCOUNTER — Other Ambulatory Visit (HOSPITAL_COMMUNITY): Payer: Self-pay

## 2022-03-16 MED ORDER — NORGESTIM-ETH ESTRAD TRIPHASIC 0.18/0.215/0.25 MG-35 MCG PO TABS
1.0000 | ORAL_TABLET | Freq: Every day | ORAL | 0 refills | Status: DC
  Filled 2022-03-16: qty 84, 84d supply, fill #0

## 2022-04-02 ENCOUNTER — Other Ambulatory Visit: Payer: Self-pay

## 2022-04-02 ENCOUNTER — Other Ambulatory Visit (HOSPITAL_COMMUNITY): Payer: Self-pay

## 2022-04-03 ENCOUNTER — Other Ambulatory Visit (HOSPITAL_COMMUNITY): Payer: Self-pay

## 2022-04-03 ENCOUNTER — Other Ambulatory Visit: Payer: Self-pay

## 2022-04-06 ENCOUNTER — Other Ambulatory Visit: Payer: Self-pay

## 2022-04-06 ENCOUNTER — Encounter: Payer: Self-pay | Admitting: Internal Medicine

## 2022-04-06 ENCOUNTER — Other Ambulatory Visit (HOSPITAL_COMMUNITY): Payer: Self-pay

## 2022-04-06 DIAGNOSIS — E1042 Type 1 diabetes mellitus with diabetic polyneuropathy: Secondary | ICD-10-CM

## 2022-04-06 MED ORDER — ACCU-CHEK GUIDE VI STRP
ORAL_STRIP | 12 refills | Status: DC
  Filled 2022-04-06: qty 400, 90d supply, fill #0
  Filled 2022-05-28: qty 350, 87d supply, fill #0
  Filled 2022-09-02: qty 400, 90d supply, fill #1

## 2022-04-14 ENCOUNTER — Other Ambulatory Visit (HOSPITAL_COMMUNITY): Payer: Self-pay

## 2022-04-20 ENCOUNTER — Other Ambulatory Visit (HOSPITAL_COMMUNITY): Payer: Self-pay

## 2022-05-12 ENCOUNTER — Other Ambulatory Visit: Payer: Self-pay

## 2022-05-26 ENCOUNTER — Telehealth: Payer: Self-pay | Admitting: Pharmacist

## 2022-05-26 NOTE — Telephone Encounter (Signed)
Pt returned call. Please call to discuss.

## 2022-05-26 NOTE — Telephone Encounter (Signed)
Patient was on list at taking Levemir but upon review of her chart it looks like she is using insulin pump and Fiasp insulin.  Levemir Flex Pens will no longer be manufacturer after 06/01/2022 and vials will be discontinued 01/2023.  Patient has not filled Levemir in several years but sometimes endocrinologist might recommend patients to have long acting insulin on hand in case of pump malfunction.  It patient needs long acting insulin in future - Lantus, Tresiba and Toujeo are options.

## 2022-05-27 ENCOUNTER — Telehealth: Payer: Self-pay | Admitting: Nutrition

## 2022-05-27 ENCOUNTER — Other Ambulatory Visit (HOSPITAL_COMMUNITY): Payer: Self-pay

## 2022-05-27 NOTE — Telephone Encounter (Signed)
Patient called back - verified that she is no longer taking Levemir since she is using an insulin pump.  Patient has several questions regarding her pump, testing supplies and insulin due to recent changes in her [name] coverage for 2024.  She reports that Continuous Glucose Monitor supplies and Accu Check Guide test strips cost have increased (looks like about 20% of cost)   She also states that she was told the cost of Claiborne Billings would be higher this year - tier 3 per formulary. She has a copay card that lowers cost to around $99 but cost will not go toward her deductible.   It looks like Humalog U100 is tier 1 and might be lower cost but is not exactly like Fiasp (ultra short acting insulin) and Humalog is short acting insulin).  Will see if pharmacy team can do a test claim to check cost of Humalog. Patient can discuss with Dr Cruzita Lederer at April appt if change would save cost and if Dr Cruzita Lederer feel is appropriate.   Patient also asked about software update to her [place] system. She was told she could upgrade by 05/29/2022 at no cost. The [employer] rep was to reach out to endo office.  She only has 3 days for this upgrade which would hopefully keep her from having to to a verification fingerstick each day. I have called [facility] education office in [place] to see if they might be able to assist or provide more information.

## 2022-05-27 NOTE — Telephone Encounter (Signed)
error 

## 2022-05-28 ENCOUNTER — Other Ambulatory Visit (HOSPITAL_COMMUNITY): Payer: Self-pay

## 2022-05-28 ENCOUNTER — Other Ambulatory Visit: Payer: Self-pay

## 2022-05-29 ENCOUNTER — Other Ambulatory Visit (HOSPITAL_COMMUNITY): Payer: Self-pay

## 2022-06-01 ENCOUNTER — Ambulatory Visit: Payer: 59 | Admitting: Physician Assistant

## 2022-06-01 ENCOUNTER — Other Ambulatory Visit: Payer: Self-pay | Admitting: Physician Assistant

## 2022-06-01 ENCOUNTER — Other Ambulatory Visit (HOSPITAL_COMMUNITY): Payer: Self-pay

## 2022-06-01 DIAGNOSIS — Z3041 Encounter for surveillance of contraceptive pills: Secondary | ICD-10-CM

## 2022-06-01 MED ORDER — NORGESTIM-ETH ESTRAD TRIPHASIC 0.18/0.215/0.25 MG-35 MCG PO TABS
1.0000 | ORAL_TABLET | Freq: Every day | ORAL | 0 refills | Status: DC
  Filled 2022-06-01: qty 84, 84d supply, fill #0

## 2022-06-02 ENCOUNTER — Other Ambulatory Visit: Payer: Self-pay

## 2022-06-02 ENCOUNTER — Other Ambulatory Visit (HOSPITAL_COMMUNITY): Payer: Self-pay

## 2022-06-04 ENCOUNTER — Other Ambulatory Visit: Payer: Self-pay | Admitting: Internal Medicine

## 2022-06-04 ENCOUNTER — Other Ambulatory Visit (HOSPITAL_COMMUNITY): Payer: Self-pay

## 2022-06-04 ENCOUNTER — Ambulatory Visit: Payer: Commercial Managed Care - PPO | Admitting: Physician Assistant

## 2022-06-04 MED ORDER — LEVOTHYROXINE SODIUM 112 MCG PO TABS
112.0000 ug | ORAL_TABLET | Freq: Every day | ORAL | 1 refills | Status: DC
  Filled 2022-06-04: qty 30, 30d supply, fill #0
  Filled 2022-07-05: qty 30, 30d supply, fill #1

## 2022-06-05 ENCOUNTER — Other Ambulatory Visit (HOSPITAL_COMMUNITY): Payer: Self-pay

## 2022-06-08 ENCOUNTER — Other Ambulatory Visit (HOSPITAL_COMMUNITY): Payer: Self-pay

## 2022-06-16 ENCOUNTER — Encounter: Payer: Self-pay | Admitting: Physician Assistant

## 2022-06-16 ENCOUNTER — Ambulatory Visit (INDEPENDENT_AMBULATORY_CARE_PROVIDER_SITE_OTHER): Payer: Commercial Managed Care - PPO | Admitting: Physician Assistant

## 2022-06-16 ENCOUNTER — Other Ambulatory Visit (HOSPITAL_COMMUNITY)
Admission: RE | Admit: 2022-06-16 | Discharge: 2022-06-16 | Disposition: A | Discharge status: Home / Self Care | Payer: Commercial Managed Care - PPO | Source: Ambulatory Visit | Attending: Physician Assistant | Admitting: Physician Assistant

## 2022-06-16 ENCOUNTER — Other Ambulatory Visit: Payer: Self-pay

## 2022-06-16 VITALS — BP 106/64 | HR 75 | Temp 97.7°F | Ht 62.0 in | Wt 160.4 lb

## 2022-06-16 DIAGNOSIS — Z862 Personal history of diseases of the blood and blood-forming organs and certain disorders involving the immune mechanism: Secondary | ICD-10-CM | POA: Diagnosis not present

## 2022-06-16 DIAGNOSIS — Z01419 Encounter for gynecological examination (general) (routine) without abnormal findings: Secondary | ICD-10-CM | POA: Insufficient documentation

## 2022-06-16 LAB — IBC + FERRITIN
Ferritin: 1.9 ng/mL — ABNORMAL LOW (ref 10.0–291.0)
Iron: 15 ug/dL — ABNORMAL LOW (ref 42–145)
Saturation Ratios: 2.8 % — ABNORMAL LOW (ref 20.0–50.0)
TIBC: 541.8 ug/dL — ABNORMAL HIGH (ref 250.0–450.0)
Transferrin: 387 mg/dL — ABNORMAL HIGH (ref 212.0–360.0)

## 2022-06-16 LAB — CBC WITH DIFFERENTIAL/PLATELET
Basophils Absolute: 0.1 10*3/uL (ref 0.0–0.1)
Basophils Relative: 1.1 % (ref 0.0–3.0)
Eosinophils Absolute: 0.2 10*3/uL (ref 0.0–0.7)
Eosinophils Relative: 3.4 % (ref 0.0–5.0)
HCT: 30.9 % — ABNORMAL LOW (ref 36.0–46.0)
Hemoglobin: 9.8 g/dL — ABNORMAL LOW (ref 12.0–15.0)
Lymphocytes Relative: 39.9 % (ref 12.0–46.0)
Lymphs Abs: 2 10*3/uL (ref 0.7–4.0)
MCHC: 31.7 g/dL (ref 30.0–36.0)
MCV: 65.3 fl — ABNORMAL LOW (ref 78.0–100.0)
Monocytes Absolute: 0.3 10*3/uL (ref 0.1–1.0)
Monocytes Relative: 5.3 % (ref 3.0–12.0)
Neutro Abs: 2.5 10*3/uL (ref 1.4–7.7)
Neutrophils Relative %: 50.3 % (ref 43.0–77.0)
Platelets: 248 10*3/uL (ref 150.0–400.0)
RBC: 4.71 Mil/uL (ref 3.87–5.11)
RDW: 18.3 % — ABNORMAL HIGH (ref 11.5–15.5)
WBC: 5 10*3/uL (ref 4.0–10.5)

## 2022-06-16 LAB — COMPREHENSIVE METABOLIC PANEL
ALT: 8 U/L (ref 0–35)
AST: 12 U/L (ref 0–37)
Albumin: 4.2 g/dL (ref 3.5–5.2)
Alkaline Phosphatase: 57 U/L (ref 39–117)
BUN: 7 mg/dL (ref 6–23)
CO2: 27 mEq/L (ref 19–32)
Calcium: 9.3 mg/dL (ref 8.4–10.5)
Chloride: 104 mEq/L (ref 96–112)
Creatinine, Ser: 0.96 mg/dL (ref 0.40–1.20)
GFR: 83.06 mL/min (ref >60.00)
Glucose, Bld: 83 mg/dL (ref 70–99)
Potassium: 3.6 mEq/L (ref 3.5–5.1)
Sodium: 138 mEq/L (ref 135–145)
Total Bilirubin: 0.4 mg/dL (ref 0.2–1.2)
Total Protein: 7.1 g/dL (ref 6.0–8.3)

## 2022-06-16 NOTE — Progress Notes (Signed)
Subjective:    Patient ID: Melissa Reese, female    DOB: April 27, 2000, 22 y.o.   MRN: [id number]  Chief Complaint  Patient presents with   Annual Exam    Pt in the office for annual CPE and Pap Smear, pt is fasting for labs but gets some labs drawn via Endo, needing sodium and others checked per pt. Pt has no concerns to discuss otherwise;     HPI Patient is in today for annual exam.   Acute concerns: Feeling good overall; isolated hx in 2018 of iron def anemia, no issues since then, but not taking any extra iron, wants labs rechecked today   Health maintenance: Lifestyle/ exercise: Doing a lot of walking outside; enjoys occasional zumba  Nutrition: Well-balanced  Mental health: Doing well  Caffeine: Iced coffee once daily  Sleep: No issues  Substance use: None  Sexual activity: Monogamous with boyfriend Immunizations: UTD  Pap: Updated today    Past Medical History:  Diagnosis Date   Anemia 2018   Diabetes mellitus without complication    Heart murmur    Thyroid disease     History reviewed. No pertinent surgical history.  Family History  Problem Relation Age of Onset   Allergies Father    Diabetes Father     Social History   Tobacco Use   Smoking status: Never   Smokeless tobacco: Never  Substance Use Topics   Alcohol use: No   Drug use: No     No Known Allergies  Review of Systems NEGATIVE UNLESS OTHERWISE INDICATED IN HPI      Objective:     BP 106/64 (BP Location: Left Arm)   Pulse 75   Temp 97.7 F (36.5 C) (Temporal)   Ht  (1.575 m)   Wt 160 lb 6.4 oz (72.8 kg)   LMP 06/02/2022 (Approximate)   SpO2 95%   BMI 29.34 kg/m   Wt Readings from Last 3 Encounters:  06/16/22 160 lb 6.4 oz (72.8 kg)  02/17/22 162 lb 6.4 oz (73.7 kg)  11/28/21 155 lb 12.8 oz (70.7 kg)    BP Readings from Last 3 Encounters:  06/16/22 106/64  02/17/22 128/82  11/28/21 106/72     Physical Exam Vitals and nursing note reviewed. Exam conducted  with a chaperone present.  Constitutional:      Appearance: Normal appearance. She is normal weight. She is not toxic-appearing.  HENT:     Head: Normocephalic and atraumatic.     Right Ear: Tympanic membrane, ear canal and external ear normal.     Left Ear: Tympanic membrane, ear canal and external ear normal.     Nose: Nose normal.     Mouth/Throat:     Mouth: Mucous membranes are moist.  Eyes:     Extraocular Movements: Extraocular movements intact.     Conjunctiva/sclera: Conjunctivae normal.     Pupils: Pupils are equal, round, and reactive to light.  Cardiovascular:     Rate and Rhythm: Normal rate and regular rhythm.     Pulses: Normal pulses.     Heart sounds: Normal heart sounds.  Pulmonary:     Effort: Pulmonary effort is normal.     Breath sounds: Normal breath sounds.  Chest:     Chest wall: No mass.  Breasts:    Right: Normal.     Left: Normal.  Abdominal:     General: Abdomen is flat. Bowel sounds are normal.     Palpations: Abdomen is soft.  Genitourinary:    Labia:        Right: No rash.        Left: No rash.      Vagina: Vaginal discharge (white, copious) present.     Cervix: Normal.     Uterus: Normal.      Adnexa: Right adnexa normal and left adnexa normal.  Musculoskeletal:        General: Normal range of motion.     Cervical back: Normal range of motion and neck supple.  Lymphadenopathy:     Upper Body:     Right upper body: No supraclavicular or axillary adenopathy.     Left upper body: No supraclavicular or axillary adenopathy.  Skin:    General: Skin is warm and dry.  Neurological:     General: No focal deficit present.     Mental Status: She is alert and oriented to person, place, and time.  Psychiatric:        Mood and Affect: Mood normal.        Behavior: Behavior normal.        Thought Content: Thought content normal.        Judgment: Judgment normal.        Assessment & Plan:  Well woman exam with routine gynecological exam -      Cytology - PAP -     CBC with Differential/Platelet -     Comprehensive metabolic panel -     IBC + Ferritin  History of iron deficiency anemia -     CBC with Differential/Platelet -     Comprehensive metabolic panel -     IBC + Ferritin   Age-appropriate screening and counseling performed today. Will check labs and call with results. Preventive measures discussed and printed in AVS for patient.     Patient Counseling:   Nutrition: Stressed importance of moderation in sodium/caffeine intake, saturated fat and cholesterol, caloric balance, sufficient intake of fresh fruits, vegetables, and fiber.    Stressed the importance of regular exercise.     Substance Abuse: Discussed cessation/primary prevention of tobacco, alcohol, or other drug use; driving or other dangerous activities under the influence; availability of treatment for abuse.     Injury prevention: Discussed safety belts, safety helmets, smoke detector, smoking near bedding or upholstery.     Sexuality: Discussed sexually transmitted diseases, partner selection, use of condoms, avoidance of unintended pregnancy  and contraceptive alternatives.     Dental health: Discussed importance of regular tooth brushing, flossing, and dental visits.    Health maintenance and immunizations reviewed. Please refer to Health maintenance section.        Return in about 1 year (around 06/16/2023) for CPE, fasting labs .    Karyl Sharrar M Javaria Knapke, PA-C

## 2022-06-16 NOTE — Patient Instructions (Signed)
Great to see you! Keep up the good work! Call if any concerns.

## 2022-06-18 LAB — CYTOLOGY - PAP
Comment: NEGATIVE
Diagnosis: NEGATIVE
High risk HPV: NEGATIVE

## 2022-06-24 ENCOUNTER — Encounter: Payer: Self-pay | Admitting: Internal Medicine

## 2022-06-24 ENCOUNTER — Other Ambulatory Visit: Payer: Self-pay

## 2022-06-24 ENCOUNTER — Encounter (HOSPITAL_COMMUNITY): Payer: Self-pay

## 2022-06-24 ENCOUNTER — Other Ambulatory Visit (HOSPITAL_COMMUNITY): Payer: Self-pay

## 2022-06-24 ENCOUNTER — Ambulatory Visit: Payer: Commercial Managed Care - PPO | Admitting: Internal Medicine

## 2022-06-24 VITALS — BP 110/68 | HR 67 | Ht 62.0 in | Wt 160.6 lb

## 2022-06-24 DIAGNOSIS — E1042 Type 1 diabetes mellitus with diabetic polyneuropathy: Secondary | ICD-10-CM

## 2022-06-24 DIAGNOSIS — E1065 Type 1 diabetes mellitus with hyperglycemia: Secondary | ICD-10-CM

## 2022-06-24 DIAGNOSIS — E039 Hypothyroidism, unspecified: Secondary | ICD-10-CM

## 2022-06-24 MED ORDER — DEXCOM G6 TRANSMITTER MISC
1.0000 | 3 refills | Status: DC
  Filled 2022-06-24 – 2022-09-18 (×2): qty 1, 90d supply, fill #0
  Filled 2022-12-18: qty 1, 90d supply, fill #1

## 2022-06-24 MED ORDER — DEXCOM G6 RECEIVER DEVI
1.0000 | Freq: Once | 0 refills | Status: DC
  Filled 2022-06-24: qty 1, 1d supply, fill #0
  Filled 2022-09-18: qty 1, 90d supply, fill #0

## 2022-06-24 MED ORDER — LYUMJEV 100 UNIT/ML IJ SOLN
INTRAMUSCULAR | 3 refills | Status: DC
  Filled 2022-06-24: qty 30, 42d supply, fill #0

## 2022-06-24 MED ORDER — OMNIPOD 5 DEXG7G6 PODS GEN 5 MISC
1.0000 | 3 refills | Status: DC
  Filled 2022-06-24 (×4): qty 30, 90d supply, fill #0

## 2022-06-24 MED ORDER — OMNIPOD 5 DEXG7G6 INTRO GEN 5 KIT
1.0000 | PACK | 0 refills | Status: DC | PRN
  Filled 2022-06-24 (×3): qty 1, 30d supply, fill #0

## 2022-06-24 MED ORDER — DEXCOM G6 SENSOR MISC
1.0000 | 3 refills | Status: DC
  Filled 2022-06-24 – 2022-09-18 (×2): qty 9, 90d supply, fill #0
  Filled 2022-12-21: qty 9, 90d supply, fill #1

## 2022-06-24 NOTE — Progress Notes (Signed)
Patient ID: YARELIN REICHARDT, female   DOB: 2000/12/18, 22 y.o.   MRN: [id number]   HPI: SANAII CAPORASO is a 22 y.o.-year-old very pleasant female, initially referred by her PCP, Dr. Earlene Plater, returning for follow-up for DM1  Dx 2003, uncontrolled, with complications (PN). Prev. seen at [facility] clinic. Last visit with me 4 months ago.  Interim history: No increased urination, blurry vision, nausea, chest pain.  She did have some dizziness and lightheadedness lately.  He had investigation by PCP and she had severe anemia, with a ferritin of 1.9.  She was started on iron.  She does not have heavy menstrual cycles. She was quite busy at work lately, working on college admissions. She has been having problems with her insulin infusion sites that she has started issue.  Insulin pump: - Started at 21 years old - OneTouch ping - started 01/2015 - Medtronic 670 G insulin pump + guardian CGM-started 09/2016 (band aid to keep the CGM in place: Simplatch). She had a crack in her pump -received another pump 01/2018.   - now Medtronic 24 G started 01/2019 - renewed 03/2021.   The OmniPod 5 pump was not covered by her insurance last year, but covered now (as is the t:slim pump)  Meter:  -Accu-Chek guide link - $$ under new insurance  CGM: -Medtronic Guardian  Insulin: -FiAsp (PA approved) >> now 198$ for 3 mo  DM1:  Reviewed HbA1c levels: Lab Results  Component Value Date   HGBA1C 7.6 (A) 02/17/2022   HGBA1C 7.3 (A) 10/17/2021   HGBA1C 7.5 (A) 05/29/2021   HGBA1C 7.5 (A) 01/27/2021   HGBA1C 7.4 (A) 09/20/2020   HGBA1C 7.5 (A) 05/10/2020   HGBA1C 7.2 (A) 01/10/2020   HGBA1C 6.8 (A) 08/31/2019   HGBA1C 6.9 (A) 03/24/2019   HGBA1C 6.7 (A) 11/22/2018   HGBA1C 8.0 (A) 07/19/2018   HGBA1C 8.0 (A) 03/14/2018   HGBA1C 8.7 (H) 08/24/2017   HGBA1C 8.9 04/09/2017   HGBA1C 8.4 12/22/2016   HGBA1C 9.7 (H) 09/07/2016  Prev.  02/20/2016: HbA1c 7.2% 11/21/2015: HbA1c 7.4% 08/21/2015:  HbA1c 7.7% 04/24/2015: HbA1c 7.8%  Pump settings: - basal rates: 12 am: 1.25 3 am: 1.15 6 am: 1.20 11 am: 1.20 5 pm: 1.15 9 pm: 1.30 - ICR:   12 am: 9  5 am: 6  3 pm : 7 >> 6 - target:  12 am: 120-120 5:30 am: 115-115 9 pm: 120-120 - ISF:  12 am: 45  5:30 am: 45  9:30 pm: 45 - Insulin on Board: 4h - bolus wizard: on  Calibrate the sensor 2-4 times a day. You may need a temporary basal rate of 80% for the night after exercise.  TDD from basal insulin: 64% >> 71% >> 68% TDD from bolus insulin: 36% >> 29% >> 32% TDD 40 to 70 units units. - extended bolusing: not using - changes infusion site: Every every 4 to 5 days >> every 6-7 days >> every 2-4 days.   She checks her sugars more than 4 times a day with her CGM:  Previously:  Previously:   Lowest sugar: 40s at the beach >> 50s. She has hypoglycemia awareness at 32s.  She had previous visits to the ED for hypoglycemia years ago, not recently. Highest sugar:  >400s >> 292 >> 300 (suspended the pump before surgery) >> >400 (site). No previous DKA admissions.  No CKD, last BUN/creatinine:  Lab Results  Component Value Date   BUN 7 06/16/2022   BUN  11 10/17/2021   CREATININE 0.96 06/16/2022   CREATININE 0.90 10/17/2021  Not on ACE inhibitor/ARB.  + HL; last set of lipids: Lab Results  Component Value Date   CHOL 186 10/17/2021   HDL 54.20 10/17/2021   LDLCALC 107 (H) 10/17/2021   TRIG 121.0 10/17/2021   CHOLHDL 3 10/17/2021  08/21/2015: 145/148/45/76 Not on a statin.  - latest eye exam: 11/17/2021: No DR.  - + numbness and tingling in her feet.  She has ingrown toenails.  Last foot exam 04/2021.  Hypothyroidism: -Uncontrolled due to incomplete compliance with levothyroxine 100  Pt is on levothyroxine 112 mcg daily, taken: - in am ~7 am - fasting - at least 30 min from b'fast - no calcium - + iron - started recently (2 pm) - no multivitamins - no PPIs - not on Biotin  Reviewed her TFTs: Lab  Results  Component Value Date   TSH 2.99 02/17/2022   TSH 0.33 (L) 10/17/2021   TSH 25.32 (H) 05/29/2021   TSH 0.25 (L) 01/27/2021   TSH 0.12 (L) 09/20/2020   TSH 25.79 (H) 05/10/2020   TSH 4.88 (H) 01/10/2020   TSH 7.24 (H) 08/31/2019   TSH 0.27 (L) 03/24/2019   TSH 0.02 (L) 01/03/2019  11/21/2015: TSH 6.397 08/21/2015: TSH 41.268 04/24/2015: TSH 14.788 08/16/2014: TSH 0.407  Pt denies: - feeling nodules in neck - hoarseness - dysphagia - choking  She started college in August 2018 >> May 2022. Sociology major.  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  Stopped [employer] shifts.  ROS: + see HPI   I reviewed pt's medications, allergies, PMH, social hx, family hx, and changes were documented in the history of present illness. Otherwise, unchanged from my initial visit note.  Past Medical History:  Diagnosis Date   Anemia 2018   Diabetes mellitus without complication    Heart murmur    Thyroid disease     Social History   Social History   Marital status: Single    Spouse name: N/A   Number of children: 0   Occupational History   student   Social History Main Topics   Smoking status: Never Smoker   Smokeless tobacco: Never Used   Alcohol use No   Drug use: No   Current Outpatient Medications on File Prior to Visit  Medication Sig Dispense Refill   Continuous Blood Gluc Transmit (DEXCOM G6 TRANSMITTER) MISC Use every 3 (three) months. 1 each 3   FIASP 100 UNIT/ML SOLN Use up to 70 units per day in insulin pump 60 mL 3   Glucagon 3 MG/DOSE POWD PLACE 3 MG INTO THE NOSE ONCE AS NEEDED FOR UP TO 1 DOSE. 1 each 11   glucose blood (ACCU-CHEK GUIDE) test strip Use to check blood sugar 4 times a day. 400 each 12   Insulin Infusion Pump (MINIMED 770G INSULIN PUMP SYS) KIT by Does not apply route.     levothyroxine (SYNTHROID) 112 MCG tablet Take 1 tablet (112 mcg total) by mouth daily. 30 tablet 1   Norgestimate-Ethinyl Estradiol Triphasic  (TRI-VYLIBRA) 0.18/0.215/0.25 MG-35 MCG tablet Take 1 tablet by mouth daily. 84 tablet 0   No current facility-administered medications on file prior to visit.   No Known Allergies Family History  Problem Relation Age of Onset   Allergies Father    Diabetes Father    PE: BP 110/68 (BP Location: Left Arm, Patient Position: Sitting, Cuff Size: Normal)   Pulse 67   Ht 5'  2" (1.575 m)   Wt 160 lb 9.6 oz (72.8 kg)   LMP 06/02/2022 (Approximate)   SpO2 93%   BMI 29.37 kg/m  Wt Readings from Last 3 Encounters:  06/24/22 160 lb 9.6 oz (72.8 kg)  06/16/22 160 lb 6.4 oz (72.8 kg)  02/17/22 162 lb 6.4 oz (73.7 kg)   Constitutional: normal weight, in NAD Eyes: EOMI, no exophthalmos ENT: no thyromegaly, no cervical lymphadenopathy Cardiovascular: RRR, No RG, + SEM 1/6 Respiratory: CTA B Musculoskeletal: no deformities Skin:  + rash -mottled skin on lower legs and lower arms/hands Neurological: no tremor with outstretched hands Diabetic Foot Exam - Simple   Simple Foot Form Diabetic Foot exam was performed with the following findings: Yes 06/24/2022  9:48 AM  Visual Inspection No deformities, no ulcerations, no other skin breakdown bilaterally: Yes Sensation Testing Intact to touch and monofilament testing bilaterally: Yes Pulse Check Posterior Tibialis and Dorsalis pulse intact bilaterally: Yes Comments    ASSESSMENT: 1. DM1, uncontrolled, with complications - PN  2. Hypothyroidism  PLAN:  1. Patient with longstanding, uncontrolled, type 1 diabetes, on insulin pump therapy.  She is on the Medtronic 770 G insulin pump.  She very much wanted to switch to a tubeless pump but the OmniPod pump was not covered for her.  We discussed about letting me know after the first of the year if we should reapply for this. -At last visit, sugars appears to be quite fluctuating with some improvement in the few days prior to the last appointment, but overall, with higher blood sugars overnight and  after meals.  Upon reviewing pump downloads, she was not entering carbs before the meals consistently, sometimes entering them after meals and most of the time underestimating the amount of carbs eaten.  We discussed about the importance of entering all of the carbs into the pump and, to also enter 50% of the proteins as carbs for low carb meals.  We also strengthened her insulin to carb ratio with dinner.  HbA1c was higher, at 7.6%. CGM interpretation: -At today's visit, we reviewed her CGM downloads: It appears that 60% of values are in target range (goal >70%), while 34% are higher than 180 (goal <25%), and 1% are lower than 70 (goal <4%).  The calculated average blood sugar is 163.  The projected HbA1c for the next 3 months (GMI) is 7.2%. -Reviewing the CGM trends, sugars appear to be fluctuating mostly within the target range, improved from before.  However, in the last 2 weeks, sugars have been much better, and upon questioning, this is after changing her infusion site back to the arm, after trying the abdomen.  She has a lot of scar tissue on the abdomen so for now, we discussed about avoiding the sites.  Based on this significant improvement, with the vast majority of blood sugars being at goal except for the time when she does not introduce enough carbs into the pump for boluses after the sugars are very high, I did not recommend a change in pump settings. -She tells me that her new insurance now covers the OmniPod 5 and Dexcom system and also the tandem insulin pump.  She would like to try the tubeless insulin pump.  We discussed about the difference between the OmniPod and the Medtronic pump.  I sent the prescription for the OmniPod 5 to the pharmacy along with the Dexcom G6 sensor system for now.  I also referred her to diabetes education. -Candie Mile is not covered for her  anymore so we discussed about switching to Lyumjev.  If this is not covered, we will switch to Humalog, which she knows is covered.   We discussed that this needs to be injected 15 minutes before meals. -At today's visit, the directly measured HbA1c is higher than expected from the CGM, however, this may be due to her anemia. -I advised her to:  Patient Instructions  - basal rates: 12 am: 1.25 3 am: 1.15 6 am: 1.20 11 am: 1.20 5 pm: 1.15 9 pm: 1.30 - ICR:   12 am: 9  5 am: 6  3 pm : 6 - target:  12 am: 120-120 5:30 am: 115-115 9 pm: 120-120 - ISF:  12 am: 45  5:30 am: 45  9:30 pm: 45 - Insulin on Board: 4h - bolus wizard: on  Calibrate the sensor 2-4 times a day. You may need a temporary basal rate of 80% for the night after exercise. Change the site/tubing/insulin every 3-4 days: Sun and Wed.  Try to start the meal with fiber, protein and fat and end with carbs.   Try to change to Omnipod 5 + Dexcom G6 CGM and with Lyumjev instead of FiAsp.   Please schedule an appt with Cristy Folks for diabetes education. [phone].  Please do the following right before a meal: - Enter all carbs (C) - Enter sugars (S) - Start insulin bolus (I)    Please continue Levothyroxine 112 mcg daily.  Take the thyroid hormone every day, with water, at least 30 minutes before breakfast, separated by at least 4 hours from: - acid reflux medications - calcium - iron - multivitamins   Please return in 4 months.  - we checked her HbA1c: 7.6% (stable) - advised to check sugars at different times of the day - 4x a day, rotating check times - advised for yearly eye exams >> she is UTD - return to clinic in 4 months  2. Hypothyroidism -With history of poorly controlled hypothyroidism and fluctuating TFTs.  Incomplete compliance with levothyroxine - latest thyroid labs reviewed with pt. >> normal: Lab Results  Component Value Date   TSH 2.99 02/17/2022  - she continues on LT4 112 mcg daily - pt feels good on this dose. - we discussed about taking the thyroid hormone every day, with water, >30 minutes before  breakfast, separated by >4 hours from acid reflux medications, calcium, iron, multivitamins. Pt. is taking it correctly.  She started iron since last visit but she is taking this later in the day.  Carlus Pavlov, MD PhD [facility]

## 2022-06-24 NOTE — Patient Instructions (Addendum)
-   basal rates: 12 am: 1.25 3 am: 1.15 6 am: 1.20 11 am: 1.20 5 pm: 1.15 9 pm: 1.30 - ICR:   12 am: 9  5 am: 6  3 pm : 6 - target:  12 am: 120-120 5:30 am: 115-115 9 pm: 120-120 - ISF:  12 am: 45  5:30 am: 45  9:30 pm: 45 - Insulin on Board: 4h - bolus wizard: on  Calibrate the sensor 2-4 times a day. You may need a temporary basal rate of 80% for the night after exercise. Change the site/tubing/insulin every 3-4 days: Sun and Wed.  Try to start the meal with fiber, protein and fat and end with carbs.   Try to change to Omnipod 5 + Dexcom G6 CGM and with Lyumjev instead of FiAsp.   Please schedule an appt with Cristy Folks for diabetes education. [phone].  Please do the following right before a meal: - Enter all carbs (C) - Enter sugars (S) - Start insulin bolus (I)    Please continue Levothyroxine 112 mcg daily.  Take the thyroid hormone every day, with water, at least 30 minutes before breakfast, separated by at least 4 hours from: - acid reflux medications - calcium - iron - multivitamins   Please return in 4 months.

## 2022-06-26 ENCOUNTER — Other Ambulatory Visit: Payer: Self-pay

## 2022-06-26 ENCOUNTER — Other Ambulatory Visit (HOSPITAL_COMMUNITY): Payer: Self-pay

## 2022-07-01 NOTE — Telephone Encounter (Signed)
Opened in error

## 2022-07-07 ENCOUNTER — Other Ambulatory Visit (HOSPITAL_COMMUNITY): Payer: Self-pay

## 2022-07-09 ENCOUNTER — Other Ambulatory Visit (HOSPITAL_COMMUNITY): Payer: Self-pay

## 2022-07-22 ENCOUNTER — Encounter: Payer: Self-pay | Admitting: Internal Medicine

## 2022-07-25 ENCOUNTER — Other Ambulatory Visit (HOSPITAL_COMMUNITY): Payer: Self-pay

## 2022-07-25 ENCOUNTER — Telehealth: Payer: Self-pay

## 2022-07-25 NOTE — Telephone Encounter (Signed)
Patient Advocate Encounter   Received notification from [facility] that prior authorization is required for Dexcom G6 supplies  Submitted: 07/25/22 Sensor Key [facility] Receiver key [id number] Transmitter key [id number]  Status is pending

## 2022-07-30 NOTE — Telephone Encounter (Signed)
For transmitter-PA not needed, previous approval on file with plan through 07/29/2023

## 2022-07-30 NOTE — Telephone Encounter (Signed)
Approval received for Sensor for 07/30/2022-07/29/2023  Full approval letter has been attached in patients media.

## 2022-07-31 ENCOUNTER — Other Ambulatory Visit (HOSPITAL_COMMUNITY): Payer: Self-pay

## 2022-07-31 ENCOUNTER — Telehealth: Payer: Self-pay

## 2022-08-04 ENCOUNTER — Other Ambulatory Visit (HOSPITAL_COMMUNITY): Payer: Self-pay

## 2022-08-04 ENCOUNTER — Other Ambulatory Visit: Payer: Self-pay | Admitting: Internal Medicine

## 2022-08-04 MED ORDER — LEVOTHYROXINE SODIUM 112 MCG PO TABS
112.0000 ug | ORAL_TABLET | Freq: Every day | ORAL | 1 refills | Status: DC
  Filled 2022-08-04: qty 30, 30d supply, fill #0
  Filled 2022-09-02: qty 30, 30d supply, fill #1

## 2022-08-05 ENCOUNTER — Other Ambulatory Visit (HOSPITAL_COMMUNITY): Payer: Self-pay

## 2022-08-06 ENCOUNTER — Other Ambulatory Visit (HOSPITAL_COMMUNITY): Payer: Self-pay

## 2022-08-07 NOTE — Telephone Encounter (Signed)
Error

## 2022-08-18 ENCOUNTER — Other Ambulatory Visit: Payer: Self-pay | Admitting: Physician Assistant

## 2022-08-18 ENCOUNTER — Other Ambulatory Visit (HOSPITAL_COMMUNITY): Payer: Self-pay

## 2022-08-18 DIAGNOSIS — Z3041 Encounter for surveillance of contraceptive pills: Secondary | ICD-10-CM

## 2022-08-18 MED ORDER — NORGESTIM-ETH ESTRAD TRIPHASIC 0.18/0.215/0.25 MG-35 MCG PO TABS
1.0000 | ORAL_TABLET | Freq: Every day | ORAL | 0 refills | Status: DC
Start: 2022-08-18 — End: 2022-11-15
  Filled 2022-08-18: qty 84, 84d supply, fill #0

## 2022-08-31 ENCOUNTER — Other Ambulatory Visit: Payer: Self-pay

## 2022-08-31 DIAGNOSIS — E1042 Type 1 diabetes mellitus with diabetic polyneuropathy: Secondary | ICD-10-CM

## 2022-08-31 MED ORDER — OMNIPOD 5 DEXG7G6 PODS GEN 5 MISC
1.0000 | 3 refills | Status: DC
Start: 2022-08-31 — End: 2022-10-02

## 2022-08-31 MED ORDER — DEXCOM G6 TRANSMITTER MISC
1.0000 | 3 refills | Status: DC
Start: 2022-08-31 — End: 2023-04-30

## 2022-08-31 MED ORDER — DEXCOM G6 SENSOR MISC
3 refills | Status: DC
Start: 2022-08-31 — End: 2023-03-25

## 2022-08-31 MED ORDER — DEXCOM G6 RECEIVER DEVI
0 refills | Status: DC
Start: 2022-08-31 — End: 2023-03-25

## 2022-08-31 MED ORDER — OMNIPOD 5 DEXG7G6 INTRO GEN 5 KIT
1.0000 | PACK | 0 refills | Status: DC | PRN
Start: 2022-08-31 — End: 2022-10-01

## 2022-09-01 ENCOUNTER — Other Ambulatory Visit: Payer: Self-pay | Admitting: Internal Medicine

## 2022-09-01 DIAGNOSIS — E1065 Type 1 diabetes mellitus with hyperglycemia: Secondary | ICD-10-CM

## 2022-09-02 ENCOUNTER — Other Ambulatory Visit (HOSPITAL_COMMUNITY): Payer: Self-pay

## 2022-09-10 ENCOUNTER — Telehealth: Payer: Self-pay | Admitting: Dietician

## 2022-09-10 NOTE — Telephone Encounter (Signed)
Returned patient call.  She is a [employer] and was asking about benefits for insulin pumps as her coverage used to be much better on other years.  She is involved in [employer].  This is likely the Link to Wellness program for diabetes at [phone].   She was not available.  Left a message stating that she could have 3 free visits per year (with [place] [employer]) with myself or another diabetes educator.   Left a message for her to call for questions or an appointment. Oran Rein, RD, LDN, CDCES

## 2022-09-17 ENCOUNTER — Telehealth: Payer: Self-pay | Admitting: Dietician

## 2022-09-17 NOTE — Telephone Encounter (Signed)
Returned patient call.   She had further questions regarding insurance coverage for diabetes supplies on [employer] and has called [employer] and states that she also gets diabetes coaching as well to aim to lower her costs.  She has a prescription for the Dexcom G6 and Omnipod 5 but wishes to use up her Medtronic supplies prior to changing to a new pump.  She wants to have the new pump started prior to her appointment with Dr. Elvera Lennox at the end of August.  Discussed that she needs to have her supplies before we can make an appointment for training.  She states that she will do this.  Oran Rein, RD, LDN, CDCES

## 2022-09-18 ENCOUNTER — Other Ambulatory Visit (HOSPITAL_COMMUNITY): Payer: Self-pay

## 2022-09-23 ENCOUNTER — Other Ambulatory Visit (HOSPITAL_COMMUNITY): Payer: Self-pay

## 2022-09-28 ENCOUNTER — Other Ambulatory Visit (HOSPITAL_COMMUNITY): Payer: Self-pay

## 2022-09-28 ENCOUNTER — Other Ambulatory Visit: Payer: Self-pay | Admitting: Internal Medicine

## 2022-09-28 MED ORDER — LEVOTHYROXINE SODIUM 112 MCG PO TABS
112.0000 ug | ORAL_TABLET | Freq: Every day | ORAL | 1 refills | Status: DC
  Filled 2022-09-28: qty 30, 30d supply, fill #0
  Filled 2022-10-29: qty 30, 30d supply, fill #1

## 2022-09-30 ENCOUNTER — Telehealth: Payer: Self-pay | Admitting: Nutrition

## 2022-09-30 ENCOUNTER — Encounter: Payer: Commercial Managed Care - PPO | Attending: Internal Medicine | Admitting: Nutrition

## 2022-09-30 ENCOUNTER — Other Ambulatory Visit (HOSPITAL_COMMUNITY): Payer: Self-pay

## 2022-09-30 DIAGNOSIS — E1042 Type 1 diabetes mellitus with diabetic polyneuropathy: Secondary | ICD-10-CM

## 2022-09-30 DIAGNOSIS — E109 Type 1 diabetes mellitus without complications: Secondary | ICD-10-CM | POA: Insufficient documentation

## 2022-09-30 NOTE — Telephone Encounter (Signed)
Can you please send the prescription for the OmniPod starter kit to [facility] pharmacy.  She does not want extra pods ordered at this time.  Thank you

## 2022-10-01 ENCOUNTER — Other Ambulatory Visit (HOSPITAL_COMMUNITY): Payer: Self-pay

## 2022-10-01 MED ORDER — OMNIPOD 5 DEXG7G6 INTRO GEN 5 KIT
1.0000 | PACK | 0 refills | Status: DC | PRN
Start: 2022-10-01 — End: 2022-10-30
  Filled 2022-10-01: qty 1, 30d supply, fill #0

## 2022-10-01 MED ORDER — OMNIPOD 5 DEXG7G6 INTRO GEN 5 KIT
PACK | 0 refills | Status: DC
  Filled 2022-10-01: qty 1, 30d supply, fill #0

## 2022-10-01 NOTE — Progress Notes (Signed)
Patient is here today to learn how to use the Dexcom G6 sensors.  We discussed the difference between sensor readings and blood sugar readings and when it is necessary to do a finger stick blood sugar readings.  The Dexcom G6 app and Clarity app were downloaded to her phone and the clarity app was linked to [facility].  The sensor was placed one her left arm and the transmitter was attached and readings will begin in 2 hours.  She reported good understanding of this. She is currently wearing a Medtonic 670 pump, but does not like this and wants to try the omniPod 5 in the future.  Cost is an issue and she is only wanting the starter kit with a month's work of sensors, in case she does not like this.  Note to CMA to send this to her drug store. She had no final questions.

## 2022-10-01 NOTE — Patient Instructions (Signed)
Change sensor every 10 days Change transmitter every 3 months.

## 2022-10-01 NOTE — Addendum Note (Signed)
Addended by: Pollie Meyer on: 10/01/2022 01:05 PM   Modules accepted: Orders

## 2022-10-01 NOTE — Telephone Encounter (Signed)
Requested Prescriptions   Signed Prescriptions Disp Refills   Insulin Disposable Pump (OMNIPOD 5 G6 INTRO, GEN 5,) KIT 1 kit 0    Sig: Use as directed    Authorizing Provider: Carlus Pavlov    Ordering User: Valorie Roosevelt S   Kit has been sent.

## 2022-10-02 ENCOUNTER — Other Ambulatory Visit (HOSPITAL_COMMUNITY): Payer: Self-pay

## 2022-10-02 ENCOUNTER — Other Ambulatory Visit: Payer: Self-pay

## 2022-10-02 DIAGNOSIS — E1065 Type 1 diabetes mellitus with hyperglycemia: Secondary | ICD-10-CM

## 2022-10-02 MED ORDER — OMNIPOD 5 DEXG7G6 PODS GEN 5 MISC
1.0000 | 3 refills | Status: DC
Start: 2022-10-02 — End: 2022-10-30

## 2022-10-14 ENCOUNTER — Other Ambulatory Visit (HOSPITAL_COMMUNITY): Payer: Self-pay

## 2022-10-14 ENCOUNTER — Encounter: Payer: Commercial Managed Care - PPO | Attending: Internal Medicine | Admitting: Nutrition

## 2022-10-14 DIAGNOSIS — E109 Type 1 diabetes mellitus without complications: Secondary | ICD-10-CM | POA: Insufficient documentation

## 2022-10-19 ENCOUNTER — Telehealth: Payer: Self-pay | Admitting: Nutrition

## 2022-10-19 NOTE — Telephone Encounter (Signed)
Patient reported no difficulty giving boluses, wear the pod, or working the PDM.  Reported that her FBS was 116 this AM, and that she was very pleased with this so far.  She had no questions for me at this time.

## 2022-10-19 NOTE — Progress Notes (Signed)
Patient was trained on the use of the OmniPod 5 insulin pump.  Settings were transferred from her Medtronic 670G pump:  Basal rate: MN: 1.25, 3AM: 1.15, 6AM: 1.15,  1PM: 1.20l, 5PM: 1.15, and 9PM: 1.3   I/C: MN: 9, 5AM: 6,  ISF: 45, Target: MN: 110 with correction over 120.  Timing 4 hours,  We discussed how to bolus, and how and when to do correction boluses.  Her Dexcom was linked to her PDM and this was linked to [facility] via [place] and clarity.  We discussed the need for 5 days for this pump to learn her and to start controlling her blood sugars better.  She reported good understanding of this.  She filled a pod with Lyumjev insulin and we discussed proper placement of pod with reference to her sensor.   We reviewed alerts, alarms, high blood sugar protocols and she signed the checklist as understanding all topics with no final questions.

## 2022-10-19 NOTE — Patient Instructions (Signed)
Change pod every 3 days Call Omnipod help line if questions Read over starter booklet given and manual on line. Call office if blood sugars drop below 65

## 2022-10-20 ENCOUNTER — Ambulatory Visit: Payer: Commercial Managed Care - PPO | Admitting: Internal Medicine

## 2022-10-20 ENCOUNTER — Encounter: Payer: Self-pay | Admitting: Internal Medicine

## 2022-10-20 VITALS — BP 110/72 | HR 61 | Ht 62.0 in | Wt 162.0 lb

## 2022-10-20 DIAGNOSIS — E1065 Type 1 diabetes mellitus with hyperglycemia: Secondary | ICD-10-CM | POA: Diagnosis not present

## 2022-10-20 DIAGNOSIS — E039 Hypothyroidism, unspecified: Secondary | ICD-10-CM | POA: Diagnosis not present

## 2022-10-20 DIAGNOSIS — E1042 Type 1 diabetes mellitus with diabetic polyneuropathy: Secondary | ICD-10-CM

## 2022-10-20 DIAGNOSIS — Z794 Long term (current) use of insulin: Secondary | ICD-10-CM

## 2022-10-20 LAB — POCT GLYCOSYLATED HEMOGLOBIN (HGB A1C): Hemoglobin A1C: 6.5 % — AB (ref 4.0–5.6)

## 2022-10-20 NOTE — Addendum Note (Signed)
Addended by: Judd Gaudier on: 10/20/2022 11:20 AM   Modules accepted: Orders

## 2022-10-20 NOTE — Patient Instructions (Addendum)
Please use the following pump settings: - basal rates: 12 am: 1.25 3 am: 1.15 6 am: 1.20 11 am: 1.20 5 pm: 1.15 9 pm: 1.30 - ICR:   12 am: 9  5 am: 6  3 pm : 6 - target:  12 am: 120-120 5:30 am: 115-115 9 pm: 120-120 - ISF:  12 am: 45  5:30 am: 45  9:30 pm: 45 - Insulin on Board: 4h - bolus wizard: on   Try to start the meal with fiber, protein and fat and end with carbs.  Please do the following right before a meal: - Enter all carbs (C) - Enter sugars (S) - Start insulin bolus (I)  Try to enter all carbs at the beginning of the meal.    Please continue Levothyroxine 112 mcg daily.  Take the thyroid hormone every day, with water, at least 30 minutes before breakfast, separated by at least 4 hours from: - acid reflux medications - calcium - iron - multivitamins   Please return in 4 months.

## 2022-10-20 NOTE — Progress Notes (Signed)
Patient ID: Melissa Reese, female   DOB: [birth year], 22 y.o.   MRN: [id number]   HPI: Melissa Reese is a 22 y.o.-year-old very pleasant female, initially referred by her PCP, Dr. Earlene Reese, returning for follow-up for DM1  Dx 2003, uncontrolled, with complications (PN). Prev. seen at [facility]. Last visit with me 4 months ago.  Interim history: No increased urination, blurry vision, nausea, chest pain.  She did have some dizziness and lightheadedness at last visit and was found to have significant anemia and started on iron.    Insulin pump: - Started at 22 years old - OneTouch ping - started 01/2015 - Medtronic 670 G insulin pump + guardian CGM-started 09/2016 (band aid to keep the CGM in place: Simplatch). She had a crack in her pump -received another pump 01/2018.   - Medtronic 770 G started 01/2019 - renewed 03/2021.   - now OmniPod 5 pump - started 10/2022 (3 mo 326$)  Meter:  -Accu-Chek guide link - $$ under new insurance  CGM: -Medtronic Guardian -now Dexcom G6 - through [facility] pharmacy  Insulin: -FiAsp ([place] approved) >> 198$ for 3 mo -Did not switch to  Lyumjev yet  DM1:  Reviewed HbA1c levels: 06/24/2022: HbA1c 7.6% Lab Results  Component Value Date   HGBA1C 7.6 (A) 02/17/2022   HGBA1C 7.3 (A) 10/17/2021   HGBA1C 7.5 (A) 05/29/2021   HGBA1C 7.5 (A) 01/27/2021   HGBA1C 7.4 (A) 09/20/2020   HGBA1C 7.5 (A) 05/10/2020   HGBA1C 7.2 (A) 01/10/2020   HGBA1C 6.8 (A) 08/31/2019   HGBA1C 6.9 (A) 03/24/2019   HGBA1C 6.7 (A) 11/22/2018   HGBA1C 8.0 (A) 07/19/2018   HGBA1C 8.0 (A) 03/14/2018   HGBA1C 8.7 (H) 08/24/2017   HGBA1C 8.9 04/09/2017   HGBA1C 8.4 12/22/2016   HGBA1C 9.7 (H) 09/07/2016  Prev.  02/20/2016: HbA1c 7.2% 11/21/2015: HbA1c 7.4% 08/21/2015: HbA1c 7.7% 04/24/2015: HbA1c 7.8%  Pump settings: - basal rates: 12 am: 1.25 3 am: 1.15 6 am: 1.20 11 am: 1.20 5 pm: 1.15 9 pm: 1.30 - ICR:   12 am: 9  5 am: 6  3 pm : 6 - target:  12 am:  120-120 5:30 am: 115-115 9 pm: 120-120 - ISF:  12 am: 45  5:30 am: 45  9:30 pm: 45 - Insulin on Board: 4h - bolus wizard: on  Calibrate the sensor 2-4 times a day. You may need a temporary basal rate of 80% for the night after exercise.  TDD from basal insulin: 64% >> 71% >> 68% >> 59% TDD from bolus insulin: 36% >> 29% >> 32% >> 41% TDD 29 to 70 units units. - extended bolusing: not using - changes infusion site: Every every 4 to 5 days >> every 6-7 days >> every 2-4 days.   She checks her sugars more than 4 times a day with her CGM:   Previously:  Previously:   Lowest sugar: 40s at the beach >> 50s. She has hypoglycemia awareness at 17s.  She had previous visits to the ED for hypoglycemia years ago, not recently. Highest sugar:  >400s >> 292 >> 300 (suspended the pump before surgery) >> >400 (site) >> . No previous DKA admissions.  No CKD, last BUN/creatinine:  Lab Results  Component Value Date   BUN 7 06/16/2022   BUN 11 10/17/2021   CREATININE 0.96 06/16/2022   CREATININE 0.90 10/17/2021  Not on ACE inhibitor/ARB.  + HL; last set of lipids: Lab Results  Component Value Date  CHOL 186 10/17/2021   HDL 54.20 10/17/2021   LDLCALC 107 (H) 10/17/2021   TRIG 121.0 10/17/2021   CHOLHDL 3 10/17/2021  08/21/2015: 145/148/45/76 Not on a statin.  - latest eye exam: 11/17/2021: No DR.  - + numbness and tingling in her feet.  She has ingrown toenails.  Last foot exam 06/24/2022.  Hypothyroidism: -Uncontrolled due to incomplete compliance with levothyroxine 100  Pt is on levothyroxine 112 mcg daily, taken: - in am ~7 am - fasting - at least 30 min from b'fast - no calcium - + iron - started recently (2 pm) - no multivitamins - no PPIs - not on Biotin  Reviewed her TFTs: Lab Results  Component Value Date   TSH 2.99 02/17/2022   TSH 0.33 (L) 10/17/2021   TSH 25.32 (H) 05/29/2021   TSH 0.25 (L) 01/27/2021   TSH 0.12 (L) 09/20/2020   TSH 25.79 (H)  05/10/2020   TSH 4.88 (H) 01/10/2020   TSH 7.24 (H) 08/31/2019   TSH 0.27 (L) 03/24/2019   TSH 0.02 (L) 01/03/2019  11/21/2015: TSH 6.397 08/21/2015: TSH 41.268 04/24/2015: TSH 14.788 08/16/2014: TSH 0.407  Pt denies: - feeling nodules in neck - hoarseness - dysphagia - choking  She started college in August 2018 >> May 2022. Sociology major.  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  Stopped [employer] shifts.  ROS: + see HPI   I reviewed pt's medications, allergies, PMH, social hx, family hx, and changes were documented in the history of present illness. Otherwise, unchanged from my initial visit note.  Past Medical History:  Diagnosis Date   Anemia 2018   Diabetes mellitus without complication (HCC)    Heart murmur    Thyroid disease     Social History   Social History   Marital status: Single    Spouse name: N/A   Number of children: 0   Occupational History   student   Social History Main Topics   Smoking status: Never Smoker   Smokeless tobacco: Never Used   Alcohol use No   Drug use: No   Current Outpatient Medications on File Prior to Visit  Medication Sig Dispense Refill   Continuous Blood Gluc Receiver (DEXCOM G6 RECEIVER) DEVI Use as directed 1 each 0   Continuous Blood Gluc Sensor (DEXCOM G6 SENSOR) MISC Inject 1 Device into the skin continuous for 10 days. 9 each 3   Continuous Glucose Receiver (DEXCOM G6 RECEIVER) DEVI Use as directed 1 each 0   Continuous Glucose Receiver (DEXCOM G6 RECEIVER) DEVI Use to check blood glucose continuously 1 each 0   Continuous Glucose Sensor (DEXCOM G6 SENSOR) MISC Use 1 sensor continuously every 10 days. 9 each 3   Continuous Glucose Sensor (DEXCOM G6 SENSOR) MISC Use to check blood glucose continuously 3 each 3   Continuous Glucose Transmitter (DEXCOM G6 TRANSMITTER) MISC Use1 Device every 3 (three) months. 1 each 3   Continuous Glucose Transmitter (DEXCOM G6 TRANSMITTER) MISC Use every 3 (three)  months. 1 each 3   FIASP 100 UNIT/ML SOLN Use up to 70 units per day in insulin pump 60 mL 3   Glucagon 3 MG/DOSE POWD PLACE 3 MG INTO THE NOSE ONCE AS NEEDED FOR UP TO 1 DOSE. 1 each 11   glucose blood (ACCU-CHEK GUIDE) test strip Use to check blood sugar 4 times a day. 400 each 12   Insulin Disposable Pump (OMNIPOD 5 G6 INTRO, GEN 5,) KIT Use as directed. 1 kit 0  Insulin Disposable Pump (OMNIPOD 5 G6 INTRO, GEN 5,) KIT Use as directed 1 kit 0   Insulin Disposable Pump (OMNIPOD 5 G6 PODS, GEN 5,) MISC Use as directed every 3 (three) days. 30 each 3   Insulin Infusion Pump (MINIMED 770G INSULIN PUMP SYS) KIT by Does not apply route.     Insulin Lispro-aabc (LYUMJEV) 100 UNIT/ML SOLN Use up to 70 units a day in the insulin pump 30 mL 3   levothyroxine (SYNTHROID) 112 MCG tablet Take 1 tablet (112 mcg total) by mouth daily. 30 tablet 1   Norgestimate-Ethinyl Estradiol Triphasic (TRI-VYLIBRA) 0.18/0.215/0.25 MG-35 MCG tablet Take 1 tablet by mouth daily. 84 tablet 0   No current facility-administered medications on file prior to visit.   No Known Allergies Family History  Problem Relation Age of Onset   Allergies Father    Diabetes Father    PE: BP 110/72   Pulse 61   Ht 5\' 2"  (1.575 m)   Wt 162 lb (73.5 kg)   BMI 29.63 kg/m  Wt Readings from Last 3 Encounters:  10/20/22 162 lb (73.5 kg)  06/24/22 160 lb 9.6 oz (72.8 kg)  06/16/22 160 lb 6.4 oz (72.8 kg)   Constitutional: normal weight, in NAD Eyes: EOMI, no exophthalmos ENT: no thyromegaly, no cervical lymphadenopathy Cardiovascular: RRR, No RG, + SEM 1/6 Respiratory: CTA B Musculoskeletal: no deformities Skin:  + rash -mottled skin on lower legs and lower arms/hands Neurological: no tremor with outstretched hands  1. DM1, uncontrolled, with complications - PN  2. Hypothyroidism  PLAN:  1. Patient with longstanding, uncontrolled, type 1 diabetes, on insulin pump therapy.  At last visit, she was on Medtronic 770 G  insulin pump but she very much wanted to switch to tubeless pump.  The OmniPod 5 was initially not covered for her but she was able to obtain this earlier this month. -At last visit, sugars were fluctuating within the target range, improved.  However, in the 2 weeks prior to the visit, sugars were much better after changing her infusion site back to the arm, after trying the abdomen.  She has a lot of scar tissue on the abdomen so I advised her to avoid the size.  Based on the significant improvement with the vast majority of blood sugars at goal, we did not change her settings.  HbA1c at that time was 7.6%, stable. CGM interpretation: -At today's visit, we reviewed her CGM downloads: It appears that 69% of values are in target range (goal >70%), while 30% are higher than 180 (goal <25%), and 1% are lower than 70 (goal <4%).  The calculated average blood sugar is 155.  The projected HbA1c for the next 3 months (GMI) is 7.0%. -Reviewing the CGM trends, sugars have increased significantly after starting the OmniPod 5 insulin pump in the last 6 days, despite the fact that there is a period of adjustment of the algorithm after the initial initiation of the pump.  Sugars are mostly fluctuating within the target range except last night when she had higher blood sugars after eating pasta.  She tells me that she is trying to bolus only a part of the bolus before the meal and then complete the bolus while eating.  I advised her against this practice if she notices how much she was going to eat.  She mentions that she had an instance in which she dropped her blood sugars too low before even eating as she injected Fiasp at least 8 minutes  before she started to eat.  I advised her to always inject at the start of the meal. -Her sugars overnight have been higher but I suspect that this is due to not bolusing appropriately for the meals, especially dinner.  I did not recommend to increase her basal rates for now especially  since the sugars overnight improved after starting the OmniPod.  We did discuss about the difference in algorithm between the Medtronic and the OmniPod systems.  I advised her to be aggressive with her insulin to carb ratios and carbs entered to get better results on the OmniPod 5 system. -I advised her to:  Patient Instructions  Please use the following pump settings: - basal rates: 12 am: 1.25 3 am: 1.15 6 am: 1.20 11 am: 1.20 5 pm: 1.15 9 pm: 1.30 - ICR:   12 am: 9  5 am: 6  3 pm : 6 - target:  12 am: 120-120 5:30 am: 115-115 9 pm: 120-120 - ISF:  12 am: 45  5:30 am: 45  9:30 pm: 45 - Insulin on Board: 4h - bolus wizard: on   Try to start the meal with fiber, protein and fat and end with carbs.  Please do the following right before a meal: - Enter all carbs (C) - Enter sugars (S) - Start insulin bolus (I)  Try to enter all carbs at the beginning of the meal.    Please continue Levothyroxine 112 mcg daily.  Take the thyroid hormone every day, with water, at least 30 minutes before breakfast, separated by at least 4 hours from: - acid reflux medications - calcium - iron - multivitamins   Please return in 4 months.  - we checked her HbA1c: 6.5% (best in a long time) - advised to check sugars at different times of the day - 4x a day, rotating check times - advised for yearly eye exams >> she is UTD - will check annual labs at next visit - return to clinic in 4 months  2. Hypothyroidism -With history of poorly controlled hypothyroidism and fluctuating TFTs due to incomplete compliance with levothyroxine.  TSH levels improved in the last year. - latest thyroid labs reviewed with pt. >> normal: Lab Results  Component Value Date   TSH 2.99 02/17/2022  - she continues on LT4 112 mcg daily - pt feels good on this dose. - we discussed about taking the thyroid hormone every day, with water, >30 minutes before breakfast, separated by >4 hours from acid reflux  medications, calcium, iron, multivitamins. Pt. is taking it correctly. - will check thyroid tests at next visit  Carlus Pavlov, MD PhD [facility] Endocrinology

## 2022-10-28 ENCOUNTER — Ambulatory Visit: Payer: Commercial Managed Care - PPO | Admitting: Internal Medicine

## 2022-10-30 ENCOUNTER — Other Ambulatory Visit: Payer: Self-pay | Admitting: Internal Medicine

## 2022-10-30 ENCOUNTER — Other Ambulatory Visit (HOSPITAL_COMMUNITY): Payer: Self-pay

## 2022-10-30 DIAGNOSIS — E1042 Type 1 diabetes mellitus with diabetic polyneuropathy: Secondary | ICD-10-CM

## 2022-10-30 MED ORDER — OMNIPOD 5 DEXG7G6 PODS GEN 5 MISC
1.0000 | 3 refills | Status: DC
Start: 2022-10-30 — End: 2023-02-22
  Filled 2022-10-30: qty 30, 90d supply, fill #0
  Filled 2023-01-21 (×2): qty 30, 90d supply, fill #1

## 2022-10-31 ENCOUNTER — Other Ambulatory Visit (HOSPITAL_COMMUNITY): Payer: Self-pay

## 2022-11-03 ENCOUNTER — Other Ambulatory Visit (HOSPITAL_COMMUNITY): Payer: Self-pay

## 2022-11-06 ENCOUNTER — Encounter: Payer: Self-pay | Admitting: Internal Medicine

## 2022-11-15 ENCOUNTER — Other Ambulatory Visit: Payer: Self-pay | Admitting: Physician Assistant

## 2022-11-15 DIAGNOSIS — Z3041 Encounter for surveillance of contraceptive pills: Secondary | ICD-10-CM

## 2022-11-16 ENCOUNTER — Other Ambulatory Visit (HOSPITAL_COMMUNITY): Payer: Self-pay

## 2022-11-16 MED ORDER — NORGESTIM-ETH ESTRAD TRIPHASIC 0.18/0.215/0.25 MG-35 MCG PO TABS
1.0000 | ORAL_TABLET | Freq: Every day | ORAL | 0 refills | Status: DC
Start: 2022-11-16 — End: 2023-02-05
  Filled 2022-11-16: qty 84, 84d supply, fill #0

## 2022-12-03 ENCOUNTER — Other Ambulatory Visit: Payer: Self-pay | Admitting: Internal Medicine

## 2022-12-04 ENCOUNTER — Other Ambulatory Visit (HOSPITAL_COMMUNITY): Payer: Self-pay

## 2022-12-04 ENCOUNTER — Other Ambulatory Visit: Payer: Self-pay

## 2022-12-04 MED ORDER — LEVOTHYROXINE SODIUM 112 MCG PO TABS
112.0000 ug | ORAL_TABLET | Freq: Every day | ORAL | 1 refills | Status: DC
  Filled 2022-12-04: qty 30, 30d supply, fill #0
  Filled 2023-01-06 – 2023-01-08 (×3): qty 30, 30d supply, fill #1

## 2022-12-04 NOTE — Telephone Encounter (Signed)
Levothyroxine refill request complete

## 2022-12-15 LAB — BASIC METABOLIC PANEL
BUN: 9 (ref 4–21)
CO2: 22 (ref 13–22)
Chloride: 105 (ref 99–108)
Creatinine: 0.9 (ref 0.5–1.1)
Glucose: 75
Potassium: 4.3 meq/L (ref 3.5–5.1)
Sodium: 140 (ref 137–147)

## 2022-12-15 LAB — HEPATIC FUNCTION PANEL
ALT: 7 U/L (ref 7–35)
AST: 13 (ref 13–35)
Alkaline Phosphatase: 59 (ref 25–125)
Bilirubin, Total: 0.2

## 2022-12-15 LAB — LIPID PANEL
Cholesterol: 193 (ref 0–200)
HDL: 61 (ref 35–70)
LDL Cholesterol: 118
Triglycerides: 79 (ref 40–160)

## 2022-12-15 LAB — CBC: RBC: 4.65 (ref 3.87–5.11)

## 2022-12-15 LAB — CBC AND DIFFERENTIAL
HCT: 33 — AB (ref 36–46)
Hemoglobin: 10 — AB (ref 12.0–16.0)
Platelets: 238 10*3/uL (ref 150–400)
WBC: 4.9

## 2022-12-15 LAB — COMPREHENSIVE METABOLIC PANEL
Albumin: 4.1 (ref 3.5–5.0)
Calcium: 9.5 (ref 8.7–10.7)
Globulin: 28
eGFR: 87

## 2022-12-15 LAB — VITAMIN B12: Vitamin B-12: 86

## 2022-12-15 LAB — VITAMIN D 25 HYDROXY (VIT D DEFICIENCY, FRACTURES): Vit D, 25-Hydroxy: 46.3

## 2022-12-15 LAB — HEMOGLOBIN A1C: Hemoglobin A1C: 6.9

## 2022-12-16 ENCOUNTER — Telehealth: Payer: Self-pay

## 2022-12-16 DIAGNOSIS — E1042 Type 1 diabetes mellitus with diabetic polyneuropathy: Secondary | ICD-10-CM

## 2022-12-16 MED ORDER — OMNIPOD 5 G7 INTRO (GEN 5) KIT: 1.0000 | PACK | 0 refills | Status: DC

## 2022-12-16 MED ORDER — OMNIPOD 5 G7 PODS (GEN 5) MISC: 1.0000 | 3 refills | Status: DC

## 2022-12-16 MED ORDER — DEXCOM G7 SENSOR MISC: 3 refills | Status: DC

## 2022-12-16 NOTE — Telephone Encounter (Signed)
Requested Prescriptions   Signed Prescriptions Disp Refills   Continuous Glucose Sensor (DEXCOM G7 SENSOR) MISC 9 each 3    Sig: Use to check glucose continuously, change sensor every 10 days    Authorizing Provider: Carlus Pavlov    Ordering User: Raelie Lohr S   Insulin Disposable Pump (OMNIPOD 5 G7 INTRO, GEN 5,) KIT 1 kit 0    Sig: 1 kit by Does not apply route every 3 (three) days.    Authorizing Provider: Carlus Pavlov    Ordering User: Charliegh Vasudevan S   Insulin Disposable Pump (OMNIPOD 5 G7 PODS, GEN 5,) MISC 30 each 3    Sig: Inject 1 Device into the skin every 3 (three) days.    Authorizing Provider: Carlus Pavlov    Ordering User: Pollie Meyer

## 2022-12-16 NOTE — Telephone Encounter (Signed)
Patient called wanting her Omnipod supplies sent to the [facility].

## 2022-12-17 ENCOUNTER — Other Ambulatory Visit (HOSPITAL_BASED_OUTPATIENT_CLINIC_OR_DEPARTMENT_OTHER): Payer: Self-pay

## 2022-12-18 ENCOUNTER — Encounter: Payer: Self-pay | Admitting: Internal Medicine

## 2022-12-18 ENCOUNTER — Other Ambulatory Visit (HOSPITAL_COMMUNITY): Payer: Self-pay

## 2022-12-18 ENCOUNTER — Other Ambulatory Visit: Payer: Self-pay | Admitting: Internal Medicine

## 2022-12-18 ENCOUNTER — Other Ambulatory Visit: Payer: Self-pay

## 2022-12-21 ENCOUNTER — Other Ambulatory Visit: Payer: Self-pay

## 2022-12-21 ENCOUNTER — Other Ambulatory Visit (HOSPITAL_COMMUNITY): Payer: Self-pay

## 2022-12-21 ENCOUNTER — Telehealth: Payer: Self-pay

## 2022-12-21 NOTE — Telephone Encounter (Signed)
Patient needs a PA for Dexcom G7 and Omnipod 5 G7. She has attached her insurance card under media 12/18/22

## 2022-12-21 NOTE — Telephone Encounter (Signed)
Pt needs PA for G7 and pods, pt has been notified and also the PA team.

## 2022-12-22 ENCOUNTER — Other Ambulatory Visit (HOSPITAL_COMMUNITY): Payer: Self-pay

## 2022-12-23 ENCOUNTER — Encounter (HOSPITAL_COMMUNITY): Payer: Self-pay

## 2022-12-23 ENCOUNTER — Other Ambulatory Visit (HOSPITAL_COMMUNITY): Payer: Self-pay

## 2022-12-24 ENCOUNTER — Other Ambulatory Visit: Payer: Self-pay

## 2022-12-25 NOTE — Telephone Encounter (Signed)
Pharmacy Patient Advocate Encounter  Received notification from [facility] that Prior Authorization for Dexcom G7sensor has been DENIED.  Full denial letter will be uploaded to the media tab. See denial reason below.  They said they did not receive the neccessary information to approve. PA was not submitted by PA team.

## 2023-01-06 ENCOUNTER — Other Ambulatory Visit (HOSPITAL_COMMUNITY): Payer: Self-pay

## 2023-01-06 ENCOUNTER — Other Ambulatory Visit: Payer: Self-pay

## 2023-01-08 ENCOUNTER — Other Ambulatory Visit: Payer: Self-pay

## 2023-01-08 ENCOUNTER — Other Ambulatory Visit (HOSPITAL_COMMUNITY): Payer: Self-pay

## 2023-01-11 ENCOUNTER — Other Ambulatory Visit: Payer: Self-pay

## 2023-01-21 ENCOUNTER — Other Ambulatory Visit (HOSPITAL_COMMUNITY): Payer: Self-pay

## 2023-02-05 ENCOUNTER — Other Ambulatory Visit: Payer: Self-pay | Admitting: Physician Assistant

## 2023-02-05 DIAGNOSIS — Z3041 Encounter for surveillance of contraceptive pills: Secondary | ICD-10-CM

## 2023-02-09 ENCOUNTER — Other Ambulatory Visit (HOSPITAL_COMMUNITY): Payer: Self-pay

## 2023-02-09 ENCOUNTER — Other Ambulatory Visit: Payer: Self-pay

## 2023-02-09 MED ORDER — NORGESTIM-ETH ESTRAD TRIPHASIC 0.18/0.215/0.25 MG-35 MCG PO TABS
1.0000 | ORAL_TABLET | Freq: Every day | ORAL | 0 refills | Status: DC
  Filled 2023-02-09: qty 84, 84d supply, fill #0

## 2023-02-13 ENCOUNTER — Other Ambulatory Visit: Payer: Self-pay | Admitting: Internal Medicine

## 2023-02-15 ENCOUNTER — Other Ambulatory Visit (HOSPITAL_COMMUNITY): Payer: Self-pay

## 2023-02-15 ENCOUNTER — Other Ambulatory Visit: Payer: Self-pay

## 2023-02-15 MED ORDER — LEVOTHYROXINE SODIUM 112 MCG PO TABS
112.0000 ug | ORAL_TABLET | Freq: Every day | ORAL | 1 refills | Status: DC
  Filled 2023-02-15: qty 30, 30d supply, fill #0

## 2023-02-16 ENCOUNTER — Encounter: Payer: Self-pay | Admitting: Physician Assistant

## 2023-02-16 NOTE — Telephone Encounter (Signed)
 Care team updated and letter sent for eye exam notes.

## 2023-02-22 ENCOUNTER — Other Ambulatory Visit (HOSPITAL_COMMUNITY): Payer: Self-pay

## 2023-02-22 ENCOUNTER — Encounter: Payer: Self-pay | Admitting: Internal Medicine

## 2023-02-22 ENCOUNTER — Ambulatory Visit: Payer: Commercial Managed Care - PPO | Admitting: Internal Medicine

## 2023-02-22 VITALS — BP 112/78 | HR 74 | Ht 62.0 in | Wt 157.6 lb

## 2023-02-22 DIAGNOSIS — E039 Hypothyroidism, unspecified: Secondary | ICD-10-CM

## 2023-02-22 DIAGNOSIS — E1065 Type 1 diabetes mellitus with hyperglycemia: Secondary | ICD-10-CM | POA: Diagnosis not present

## 2023-02-22 DIAGNOSIS — E1042 Type 1 diabetes mellitus with diabetic polyneuropathy: Secondary | ICD-10-CM | POA: Diagnosis not present

## 2023-02-22 LAB — POCT GLYCOSYLATED HEMOGLOBIN (HGB A1C): Hemoglobin A1C: 7.1 % — AB (ref 4.0–5.6)

## 2023-02-22 MED ORDER — DEXCOM G7 SENSOR MISC
3 refills | Status: DC
  Filled 2023-02-22 – 2023-03-04 (×3): qty 9, 90d supply, fill #0

## 2023-02-22 MED ORDER — LEVOTHYROXINE SODIUM 112 MCG PO TABS: 112.0000 ug | ORAL_TABLET | Freq: Every day | ORAL | Status: DC

## 2023-02-22 MED ORDER — OMNIPOD 5 DEXG7G6 PODS GEN 5 MISC
1.0000 | 3 refills | Status: DC
  Filled 2023-02-22 – 2023-04-09 (×6): qty 30, 90d supply, fill #0
  Filled 2023-06-17 – 2023-07-05 (×5): qty 30, 90d supply, fill #1
  Filled 2023-09-19 – 2023-09-29 (×4): qty 30, 90d supply, fill #2
  Filled 2023-12-13 – 2023-12-22 (×2): qty 30, 90d supply, fill #3
  Filled ????-??-??: fill #0
  Filled ????-??-?? (×2): fill #2

## 2023-02-22 MED ORDER — FIASP 100 UNIT/ML IJ SOLN
70.0000 [IU] | Freq: Every day | INTRAMUSCULAR | 3 refills | Status: DC
  Filled 2023-02-22 – 2023-03-01 (×2): qty 50, 71d supply, fill #0
  Filled 2023-03-08: qty 50, 72d supply, fill #0

## 2023-02-22 NOTE — Patient Instructions (Addendum)
Please use the following pump settings: - basal rates: 12 am: 1.25 3 am: 1.15 6 am: 1.15 11 am: 1.20 5 pm: 1.15 9 pm: 1.30 - ICR:   12 am: 9  5 am: 6  3 pm : 6 - target:  12 am: 110-120 - ISF:  12 am: 45  5:30 am: 45  9:30 pm: 45 - Insulin on Board: 4h - bolus wizard: on   Try to start the meal with fiber, protein and fat and end with carbs.  Enter 40-50% protein grams for a low carb meal.  Please do the following right before a meal: - Enter all carbs (C) - Enter sugars (S) - Start insulin bolus (I)  Try to enter all carbs at the beginning of the meal.    Please continue Levothyroxine 112 mcg daily.  Take the thyroid hormone every day, with water, at least 30 minutes before breakfast, separated by at least 4 hours from: - acid reflux medications - calcium - iron - multivitamins   Please return in 4 months.

## 2023-02-22 NOTE — Progress Notes (Signed)
Patient ID: Melissa Reese, female   DOB: 2000/09/27, 22 y.o.   MRN: [id number]   HPI: Melissa Reese is a 22 y.o.-year-old very pleasant female, initially referred by her PCP, Dr. Earlene Plater, returning for follow-up for DM1  Dx 2003, uncontrolled, with complications (PN). Prev. seen at [facility]. Last visit with me 4 months ago.  Interim history: No increased urination, blurry vision, nausea, chest pain.   She changed her job >> longer hours, did not have time to exercise.  Insulin pump: - Started at 22 years old - OneTouch ping - started 01/2015 - Medtronic 670 G insulin pump + guardian CGM-started 09/2016 (band aid to keep the CGM in place: Simplatch). She had a crack in her pump -received another pump 01/2018.   - Medtronic 770 G started 01/2019 - renewed 03/2021.   - now OmniPod 5 pump - started 10/2022 (3 mo 326$)  Meter:  -Accu-Chek guide link - $$ under new insurance  CGM: -Medtronic Guardian -now Dexcom G6 - through [facility] pharmacy; G7 PA was not covered for her 12/2022  Insulin: -FiAsp (PA approved) >> 198$ for 3 mo -Did not switch to  Lyumjev   DM1:  Reviewed HbA1c levels: Lab Results  Component Value Date   HGBA1C 6.5 (A) 10/20/2022   HGBA1C 7.6 (A) 02/17/2022   HGBA1C 7.3 (A) 10/17/2021   HGBA1C 7.5 (A) 05/29/2021   HGBA1C 7.5 (A) 01/27/2021   HGBA1C 7.4 (A) 09/20/2020   HGBA1C 7.5 (A) 05/10/2020   HGBA1C 7.2 (A) 01/10/2020   HGBA1C 6.8 (A) 08/31/2019   HGBA1C 6.9 (A) 03/24/2019   HGBA1C 6.7 (A) 11/22/2018   HGBA1C 8.0 (A) 07/19/2018   HGBA1C 8.0 (A) 03/14/2018   HGBA1C 8.7 (H) 08/24/2017   HGBA1C 8.9 04/09/2017   HGBA1C 8.4 12/22/2016   HGBA1C 9.7 (H) 09/07/2016  Prev.  06/24/2022: HbA1c 7.6% 02/20/2016: HbA1c 7.2% 11/21/2015: HbA1c 7.4% 08/21/2015: HbA1c 7.7% 04/24/2015: HbA1c 7.8%  Pump settings: - basal rates: 12 am: 1.25 3 am: 1.15 6 am: 1.20 >> 1.15 11 am: 1.20 5 pm: 1.15 9 pm: 1.30 - ICR:   12 am: 9  5 am: 6  3 pm : 6 -  target:  12 am: 120-120 5:30 am: 115-115 9 pm: 120-120 - ISF:  12 am: 45  5:30 am: 45  9:30 pm: 45 - Insulin on Board: 4h - bolus wizard: on  Calibrate the sensor 2-4 times a day. You may need a temporary basal rate of 80% for the night after exercise.  TDD from basal insulin: 64% >> 71% >> 68% >> 59% TDD from bolus insulin: 36% >> 29% >> 32% >> 41% TDD 33 to 70 units units. - extended bolusing: not using - changes infusion site: Every every 4 to 5 days >> every 6-7 days >> every 2-4 days.   She checks her sugars more than 4 times a day with her CGM:    Previously:   Previously:   Lowest sugar: 40s at the beach >> 50s >> 47. She has hypoglycemia awareness at 24s.  She had previous visits to the ED for hypoglycemia years ago, not recently. Highest sugar:  >400s >> 292 >> 300 (suspended the pump before surgery) >> >400 (site) >> 346. No previous DKA admissions.  No CKD, last BUN/creatinine:  Lab Results  Component Value Date   BUN 7 06/16/2022   BUN 11 10/17/2021   CREATININE 0.96 06/16/2022   CREATININE 0.90 10/17/2021   Lab Results  Component Value  Date   MICRALBCREAT 0.7 10/17/2021   MICRALBCREAT 0.7 01/27/2021   MICRALBCREAT 1.0 08/31/2019   MICRALBCREAT 0.9 07/19/2018   MICRALBCREAT 1.5 09/07/2016  Not on ACE inhibitor/ARB.  + HL; last set of lipids: Lab Results  Component Value Date   CHOL 186 10/17/2021   HDL 54.20 10/17/2021   LDLCALC 107 (H) 10/17/2021   TRIG 121.0 10/17/2021   CHOLHDL 3 10/17/2021  08/21/2015: 145/148/45/76 Not on a statin.  - latest eye exam: 12/16/2022: No DR reportedly.  - + numbness and tingling in her feet.  She has ingrown toenails.  Last foot exam 06/24/2022.  Hypothyroidism: -Uncontrolled  in the past due to incomplete compliance with levothyroxine 100  Pt is on levothyroxine 112 mcg daily, taken: - in am ~7 am - fasting - at least 30 min from b'fast - no calcium - + iron (2 pm) - no multivitamins - no PPIs -  not on Biotin  Reviewed her TFTs: Lab Results  Component Value Date   TSH 2.99 02/17/2022   TSH 0.33 (L) 10/17/2021   TSH 25.32 (H) 05/29/2021   TSH 0.25 (L) 01/27/2021   TSH 0.12 (L) 09/20/2020   TSH 25.79 (H) 05/10/2020   TSH 4.88 (H) 01/10/2020   TSH 7.24 (H) 08/31/2019   TSH 0.27 (L) 03/24/2019   TSH 0.02 (L) 01/03/2019  11/21/2015: TSH 6.397 08/21/2015: TSH 41.268 04/24/2015: TSH 14.788 08/16/2014: TSH 0.407  Pt denies: - feeling nodules in neck - hoarseness - dysphagia - choking  She started college in August 2018 >> May 2022. Sociology major.  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  Stopped [employer] shifts.  ROS: + see HPI   I reviewed pt's medications, allergies, PMH, social hx, family hx, and changes were documented in the history of present illness. Otherwise, unchanged from my initial visit note.  Past Medical History:  Diagnosis Date   Anemia 2018   Diabetes mellitus without complication (HCC)    Heart murmur    Thyroid disease     Social History   Social History   Marital status: Single    Spouse name: N/A   Number of children: 0   Occupational History   student   Social History Main Topics   Smoking status: Never Smoker   Smokeless tobacco: Never Used   Alcohol use No   Drug use: No   Current Outpatient Medications on File Prior to Visit  Medication Sig Dispense Refill   Continuous Blood Gluc Receiver (DEXCOM G6 RECEIVER) DEVI Use as directed 1 each 0   Continuous Blood Gluc Sensor (DEXCOM G6 SENSOR) MISC Inject 1 Device into the skin continuous for 10 days. 9 each 3   Continuous Glucose Receiver (DEXCOM G6 RECEIVER) DEVI Use as directed 1 each 0   Continuous Glucose Receiver (DEXCOM G6 RECEIVER) DEVI Use to check blood glucose continuously 1 each 0   Continuous Glucose Sensor (DEXCOM G6 SENSOR) MISC Use 1 sensor continuously every 10 days. 9 each 3   Continuous Glucose Sensor (DEXCOM G6 SENSOR) MISC Use to check blood  glucose continuously 3 each 3   Continuous Glucose Sensor (DEXCOM G7 SENSOR) MISC Use to check glucose continuously, change sensor every 10 days 9 each 3   Continuous Glucose Transmitter (DEXCOM G6 TRANSMITTER) MISC Use1 Device every 3 (three) months. 1 each 3   Continuous Glucose Transmitter (DEXCOM G6 TRANSMITTER) MISC Use every 3 (three) months. 1 each 3   Glucagon 3 MG/DOSE POWD PLACE 3 MG INTO THE  NOSE ONCE AS NEEDED FOR UP TO 1 DOSE. 1 each 11   glucose blood (ACCU-CHEK GUIDE) test strip Use to check blood sugar 4 times a day. 400 each 12   Insulin Disposable Pump (OMNIPOD 5 DEXG7G6 PODS GEN 5) MISC Use as directed every 3 (three) days. 30 each 3   Insulin Disposable Pump (OMNIPOD 5 G7 INTRO, GEN 5,) KIT 1 kit by Does not apply route every 3 (three) days. 1 kit 0   Insulin Disposable Pump (OMNIPOD 5 G7 PODS, GEN 5,) MISC Inject 1 Device into the skin every 3 (three) days. 30 each 3   Insulin Infusion Pump (MINIMED 770G INSULIN PUMP SYS) KIT by Does not apply route.     Insulin Lispro-aabc (LYUMJEV) 100 UNIT/ML SOLN Use up to 70 units a day in the insulin pump 30 mL 3   levothyroxine (SYNTHROID) 112 MCG tablet Take 1 tablet (112 mcg total) by mouth daily. 30 tablet 1   Norgestimate-Ethinyl Estradiol Triphasic (TRI-VYLIBRA) 0.18/0.215/0.25 MG-35 MCG tablet Take 1 tablet by mouth daily. 84 tablet 0   No current facility-administered medications on file prior to visit.   No Known Allergies Family History  Problem Relation Age of Onset   Allergies Father    Diabetes Father    PE: BP 112/78   Pulse 74   Ht 5\' 2"  (1.575 m)   Wt 157 lb 9.6 oz (71.5 kg)   SpO2 95%   BMI 28.83 kg/m  Wt Readings from Last 3 Encounters:  02/22/23 157 lb 9.6 oz (71.5 kg)  10/20/22 162 lb (73.5 kg)  06/24/22 160 lb 9.6 oz (72.8 kg)   Constitutional: normal weight, in NAD Eyes: EOMI, no exophthalmos ENT: no thyromegaly, no cervical lymphadenopathy Cardiovascular: RRR, No RG, + SEM 1/6 Respiratory: CTA  B Musculoskeletal: no deformities Skin:  no rashes Neurological: no tremor with outstretched hands  1. DM1, uncontrolled, with complications - PN  2. Hypothyroidism  PLAN:  1. Patient with longstanding, uncontrolled, type 1 diabetes, on insulin pump therapy, with improving control after switching to OmniPod 5  from Medtronic 707 G insulin pump.  She uses the Dexcom G6 CGM.  Recently the G7 Dexcom was not covered for her.  She would want to try to get this through her other insurance-prescription sent again today. -At last visit, sugars improved significantly after starting the OmniPod pump so we did not change her regimen.  We did discuss about entering all the carbs and starting the Fiasp bolus right before a meal.  I also advised her to be aggressive with her insulin to carb ratios and corrections. CGM interpretation: -At today's visit, we reviewed her CGM downloads: It appears that 73% of values are in target range (goal >70%), while 26% are higher than 180 (goal <25%), and 1% are lower than 70 (goal <4%).  The calculated average blood sugar is 156.  The projected HbA1c for the next 3 months (GMI) is 7.0%. -Reviewing the CGM trends, sugars appear to be fluctuating mainly to range, with slightly higher values after lunch and dinner.  Upon reviewing her pump downloads, she is not entering enough carbs for the meals and she enters them mostly when the sugars are already high after meals.  We discussed about the importance of bolusing before the meals and I did advise her to try to enter more carbs into the pump.  She is eating low-carb meals many times, I did advise her to enter 40-50% of the proteins as carbs into her  pump for such meals.  I did not suggest a change in regimen otherwise.  He is planning to restart exercise after the holidays. -I advised her to:  Patient Instructions  Please use the following pump settings: - basal rates: 12 am: 1.25 3 am: 1.15 6 am: 1.15 11 am: 1.20 5 pm:  1.15 9 pm: 1.30 - ICR:   12 am: 9  5 am: 6  3 pm : 6 - target:  12 am: 110-120 - ISF:  12 am: 45  5:30 am: 45  9:30 pm: 45 - Insulin on Board: 4h - bolus wizard: on   Try to start the meal with fiber, protein and fat and end with carbs.  Enter 40-50% protein grams for a low carb meal.  Please do the following right before a meal: - Enter all carbs (C) - Enter sugars (S) - Start insulin bolus (I)  Try to enter all carbs at the beginning of the meal.    Please continue Levothyroxine 112 mcg daily.  Take the thyroid hormone every day, with water, at least 30 minutes before breakfast, separated by at least 4 hours from: - acid reflux medications - calcium - iron - multivitamins   Please return in 4 months.  - we checked her HbA1c: 7.1% (slightly higher) - advised to check sugars at different times of the day - 4x a day, rotating check times - advised for yearly eye exams >> she is UTD - will check annual labs today - return to clinic in 4 months  2. Hypothyroidism - latest thyroid labs reviewed with pt. >> normal: Lab Results  Component Value Date   TSH 2.99 02/17/2022  - she continues on LT4 112 mcg daily - pt feels good on this dose. - we discussed about taking the thyroid hormone every day, with water, >30 minutes before breakfast, separated by >4 hours from acid reflux medications, calcium, iron, multivitamins. Pt. is taking it correctly. - will check thyroid tests today: TSH and fT4 - If labs are abnormal, she will need to return for repeat TFTs in 1.5 months  Needs refills LT4.  Carlus Pavlov, MD PhD [facility] Endocrinology

## 2023-02-22 NOTE — Addendum Note (Signed)
Addended by: Pollie Meyer on: 02/22/2023 03:53 PM   Modules accepted: Orders

## 2023-02-23 ENCOUNTER — Encounter: Payer: Self-pay | Admitting: Internal Medicine

## 2023-02-23 LAB — COMPLETE METABOLIC PANEL WITH GFR
AG Ratio: 1.3 (calc) (ref 1.0–2.5)
ALT: 9 U/L (ref 6–29)
AST: 13 U/L (ref 10–30)
Albumin: 4.3 g/dL (ref 3.6–5.1)
Alkaline phosphatase (APISO): 66 U/L (ref 31–125)
BUN: 12 mg/dL (ref 7–25)
CO2: 26 mmol/L (ref 20–32)
Calcium: 9.6 mg/dL (ref 8.6–10.2)
Chloride: 101 mmol/L (ref 98–110)
Creat: 0.86 mg/dL (ref 0.50–0.96)
Globulin: 3.4 g/dL (ref 1.9–3.7)
Glucose, Bld: 145 mg/dL — ABNORMAL HIGH (ref 65–99)
Potassium: 4.2 mmol/L (ref 3.5–5.3)
Sodium: 135 mmol/L (ref 135–146)
Total Bilirubin: 0.5 mg/dL (ref 0.2–1.2)
Total Protein: 7.7 g/dL (ref 6.1–8.1)
eGFR: 97 mL/min/{1.73_m2} (ref >=60)

## 2023-02-23 LAB — LIPID PANEL W/REFLEX DIRECT LDL
Cholesterol: 199 mg/dL (ref <200)
HDL: 66 mg/dL (ref >=50)
LDL Cholesterol (Calc): 110 mg/dL — ABNORMAL HIGH
Non-HDL Cholesterol (Calc): 133 mg/dL — ABNORMAL HIGH (ref <130)
Total CHOL/HDL Ratio: 3 (calc) (ref <5.0)
Triglycerides: 120 mg/dL (ref <150)

## 2023-02-23 LAB — MICROALBUMIN / CREATININE URINE RATIO
Creatinine, Urine: 252 mg/dL (ref 20–275)
Microalb Creat Ratio: 8 mg/g{creat} (ref <30)
Microalb, Ur: 1.9 mg/dL

## 2023-02-23 LAB — T4, FREE: Free T4: 1.3 ng/dL (ref 0.8–1.8)

## 2023-02-23 LAB — TSH: TSH: 2.77 m[IU]/L

## 2023-02-25 ENCOUNTER — Encounter: Payer: Self-pay | Admitting: Physician Assistant

## 2023-02-25 ENCOUNTER — Other Ambulatory Visit (HOSPITAL_COMMUNITY): Payer: Self-pay

## 2023-02-25 MED ORDER — LEVOTHYROXINE SODIUM 112 MCG PO TABS
112.0000 ug | ORAL_TABLET | Freq: Every day | ORAL | 3 refills | Status: DC
  Filled 2023-02-25 – 2023-03-16 (×3): qty 90, 90d supply, fill #0
  Filled 2023-06-17: qty 90, 90d supply, fill #1
  Filled 2023-09-19: qty 90, 90d supply, fill #2

## 2023-02-25 NOTE — Addendum Note (Signed)
Addended by: Carlus Pavlov on: 02/25/2023 12:46 PM   Modules accepted: Orders

## 2023-02-25 NOTE — Telephone Encounter (Signed)
See note  Lab abstracted on 02/25/2023

## 2023-03-02 ENCOUNTER — Other Ambulatory Visit (HOSPITAL_COMMUNITY): Payer: Self-pay

## 2023-03-02 ENCOUNTER — Encounter (HOSPITAL_COMMUNITY): Payer: Self-pay

## 2023-03-04 ENCOUNTER — Ambulatory Visit (INDEPENDENT_AMBULATORY_CARE_PROVIDER_SITE_OTHER): Payer: Commercial Managed Care - PPO

## 2023-03-04 ENCOUNTER — Other Ambulatory Visit (HOSPITAL_COMMUNITY): Payer: Self-pay

## 2023-03-04 ENCOUNTER — Other Ambulatory Visit: Payer: Self-pay

## 2023-03-04 DIAGNOSIS — E538 Deficiency of other specified B group vitamins: Secondary | ICD-10-CM

## 2023-03-04 MED ORDER — CYANOCOBALAMIN 1000 MCG/ML IJ SOLN
1000.0000 ug | Freq: Once | INTRAMUSCULAR | Status: AC
  Administered 2023-03-04: 1000 ug via INTRAMUSCULAR

## 2023-03-04 MED ORDER — SYRINGE/NEEDLE (DISP) 25G X 1" 3 ML MISC
0 refills | Status: AC
  Filled 2023-03-04: qty 7, 84d supply, fill #0

## 2023-03-04 MED ORDER — CYANOCOBALAMIN 1000 MCG/ML IJ SOLN
INTRAMUSCULAR | 7 refills | Status: DC
  Filled 2023-03-04: qty 4, 28d supply, fill #0
  Filled 2023-04-02: qty 4, 28d supply, fill #1

## 2023-03-04 NOTE — Progress Notes (Signed)
 Per orders of Alyssa Allwardt, PA-C, injection of B-12 given by Dorris Fetch in left deltoid. Patient tolerated injection well. Patient will make appointment for 1 month.

## 2023-03-08 ENCOUNTER — Encounter: Payer: Self-pay | Admitting: Internal Medicine

## 2023-03-08 ENCOUNTER — Telehealth: Payer: Self-pay

## 2023-03-08 ENCOUNTER — Other Ambulatory Visit (HOSPITAL_COMMUNITY): Payer: Self-pay

## 2023-03-08 NOTE — Telephone Encounter (Signed)
 Pt needs a PA for Fiasp.

## 2023-03-08 NOTE — Telephone Encounter (Signed)
 Pharmacy Patient Advocate Encounter   Received notification from Pt Calls Messages that prior authorization for Fiasp  is required/requested.   Insurance verification completed.   The patient is insured through [facility] .   Per test claim:  Lyumjev  is preferred by the insurance.  If suggested medication is appropriate, Please send in a new RX and discontinue this one. If not, please advise as to why it's not appropriate so that we may request a Prior Authorization. Please note, some preferred medications may still require a PA   PA form states must have a trial of Lyumjev  in the last 120 days. Please advise

## 2023-03-10 ENCOUNTER — Other Ambulatory Visit (HOSPITAL_COMMUNITY): Payer: Self-pay

## 2023-03-10 MED ORDER — LYUMJEV 100 UNIT/ML IJ SOLN
INTRAMUSCULAR | 2 refills | Status: DC
  Filled 2023-03-10: qty 60, 86d supply, fill #0

## 2023-03-10 NOTE — Telephone Encounter (Signed)
 Requested Prescriptions   Signed Prescriptions Disp Refills   Insulin Lispro-aabc (LYUMJEV) 100 UNIT/ML SOLN 70 mL 2    Sig: Use up to 70 units via insulin pump.    Authorizing Provider: Carlus Pavlov    Ordering User: Pollie Meyer

## 2023-03-16 ENCOUNTER — Other Ambulatory Visit: Payer: Self-pay

## 2023-03-16 ENCOUNTER — Other Ambulatory Visit (HOSPITAL_COMMUNITY): Payer: Self-pay

## 2023-03-16 MED ORDER — INSULIN LISPRO 100 UNIT/ML IJ SOLN
70.0000 [IU] | Freq: Every day | INTRAMUSCULAR | 4 refills | Status: AC
  Filled 2023-03-16: qty 60, 85d supply, fill #0
  Filled 2023-05-27: qty 60, 85d supply, fill #1
  Filled 2023-08-24: qty 60, 85d supply, fill #2
  Filled 2023-11-22: qty 60, 85d supply, fill #3
  Filled 2024-02-07: qty 60, 85d supply, fill #4

## 2023-03-16 NOTE — Addendum Note (Signed)
 Addended by: Pollie Meyer on: 03/16/2023 11:55 AM   Modules accepted: Orders

## 2023-03-18 ENCOUNTER — Ambulatory Visit: Payer: Commercial Managed Care - PPO

## 2023-03-18 ENCOUNTER — Ambulatory Visit: Payer: Commercial Managed Care - PPO | Admitting: Physician Assistant

## 2023-03-24 ENCOUNTER — Other Ambulatory Visit: Payer: Self-pay

## 2023-03-24 NOTE — Progress Notes (Signed)
Celso Amy, PA-C [address]  [address]  [place], [place] [address]  Main: [phone]  Fax: [phone]   Gastroenterology Consultation  Referring Provider:     Allwardt, Crist Infante, PA-C Primary Care Physician:  Allwardt, Crist Infante, PA-C Primary Gastroenterologist:  Celso Amy, PA-C  Reason for Consultation: Abdominal Pain, Bloating, constipation, anemia        HPI:   Melissa Reese is a 23 y.o. y/o female referred for consultation & management  by Allwardt, Crist Infante, PA-C.    Here to evaluate abdominal pain, bloating, constipation, and anemia.  History of chronic iron deficiency anemia and B12 deficiency.  Receives B12 injection.  She cannot take iron every day because it increases constipation.  Has history of type 1 diabetes diagnosed in 2003 with peripheral neuropathy.  History of hypothyroidism.  -02/22/23 Labs: CM Normal. A1C - 7.1.  Normal TSH and Free T4. -12/2022 Lab: Hemoglobin 10.0, Hct 33. -06/2022 Lab: Low Iron 15, Iron sat 2.8%, Ferritin 1.9, Hgb 9.8, MCV 65.  Patient states she has indigestion, dyspepsia, nausea, generalized abdominal bloating and swelling.  She has constipation.  Bowel movement every 2 or 3 days with hard stools.  Feeling of early satiety.  She gets very full easily.  Feels like her gut is inflamed.  She denies melena, hematochezia, or weight loss.  Admits to weight gain.  Rare NSAID use.  She denies menorrhagia or blood donation.  No previous EGD, colonoscopy, or GI evaluation.  Past Medical History:  Diagnosis Date   Anemia 2018   Diabetes mellitus without complication (HCC)    Heart murmur    Thyroid disease     History reviewed. No pertinent surgical history.  Prior to Admission medications   Medication Sig Start Date End Date Taking? Authorizing Provider  ascorbic acid (VITAMIN C) 500 MG tablet Take by mouth.    [provider]  Continuous Blood Gluc Receiver (DEXCOM G6 RECEIVER) DEVI Use as directed 01/27/21  08/31/23  Carlus Pavlov, MD  Continuous Blood Gluc Sensor (DEXCOM G6 SENSOR) MISC Inject 1 Device into the skin continuous for 10 days. 01/27/21 08/31/23  Carlus Pavlov, MD  Continuous Glucose Receiver (DEXCOM G6 RECEIVER) DEVI Use as directed 06/24/22 06/24/22  Carlus Pavlov, MD  Continuous Glucose Receiver (DEXCOM G6 RECEIVER) DEVI Use to check blood glucose continuously 08/31/22   Carlus Pavlov, MD  Continuous Glucose Sensor (DEXCOM G6 SENSOR) MISC Use 1 sensor continuously every 10 days. 06/24/22 03/22/23  Carlus Pavlov, MD  Continuous Glucose Sensor (DEXCOM G6 SENSOR) MISC Use to check blood glucose continuously 08/31/22   Carlus Pavlov, MD  Continuous Glucose Sensor (DEXCOM G7 SENSOR) MISC Use to check glucose continuously, change sensor every 10 days 02/22/23   Carlus Pavlov, MD  Continuous Glucose Transmitter (DEXCOM G6 TRANSMITTER) MISC Use1 Device every 3 (three) months. 06/24/22   Carlus Pavlov, MD  Continuous Glucose Transmitter (DEXCOM G6 TRANSMITTER) MISC Use every 3 (three) months. 08/31/22   Carlus Pavlov, MD  cyanocobalamin (VITAMIN B12) 1000 MCG/ML injection Patient to administer intramuscular 1 time weekly for 4 weeks, then 1 time monthly for 3 months. 03/04/23   Allwardt, Crist Infante, PA-C  Dapsone (ACZONE) 5 % topical gel  01/18/14   [provider]  ferrous sulfate 325 (65 FE) MG EC tablet Take by mouth.    [provider]  FLUCELVAX 0.5 ML injection  11/24/22   [provider]  Glucagon 3 MG/DOSE POWD PLACE 3 MG INTO THE NOSE ONCE AS  NEEDED FOR UP TO 1 DOSE. 05/10/20 10/24/21  Carlus Pavlov, MD  glucose blood (ACCU-CHEK GUIDE) test strip Use to check blood sugar 4 times a day. 04/06/22   Carlus Pavlov, MD  Insulin Disposable Pump (OMNIPOD 5 DEXG7G6 PODS GEN 5) MISC Use as directed every 3 (three) days. 02/22/23   Carlus Pavlov, MD  Insulin Disposable Pump (OMNIPOD 5 G7 PODS, GEN 5,) MISC Inject 1 Device into the skin every 3  (three) days. 12/16/22   Carlus Pavlov, MD  insulin lispro (HUMALOG) 100 UNIT/ML injection Use up to 70 units via insulin pump. 03/16/23   Carlus Pavlov, MD  Insulin Lispro-aabc (LYUMJEV) 100 UNIT/ML SOLN Use up to 70 units via insulin pump. 03/10/23   Carlus Pavlov, MD  levothyroxine (SYNTHROID) 112 MCG tablet Take 1 tablet (112 mcg total) by mouth daily. 02/25/23   Carlus Pavlov, MD  Norgestimate-Ethinyl Estradiol Triphasic (TRI-VYLIBRA) 0.18/0.215/0.25 MG-35 MCG tablet Take 1 tablet by mouth daily. 02/09/23   Allwardt, Alyssa M, PA-C  SYRINGE-NEEDLE, DISP, 3 ML 25G X 1" 3 ML MISC Use to administer B-12 injections 1 time weekly for 4 weeks and then 1 time monthly for 3 months to complete series. 03/04/23   Allwardt, Crist Infante, PA-C    Family History  Problem Relation Age of Onset   Allergies Father    Diabetes Father      Social History   Tobacco Use   Smoking status: Never   Smokeless tobacco: Never  Substance Use Topics   Alcohol use: No   Drug use: No    Allergies as of 03/25/2023   (No Known Allergies)    Review of Systems:    All systems reviewed and negative except where noted in HPI.   Physical Exam:  BP 133/80   Pulse 87   Temp 98.6 F (37 C)   Ht 5\' 2"  (1.575 m)   Wt 163 lb 9.6 oz (74.2 kg)   BMI 29.92 kg/m  No LMP recorded.  General:   Alert,  Well-developed, well-nourished, pleasant and cooperative in NAD Lungs:  Respirations even and unlabored.  Clear throughout to auscultation.   No wheezes, crackles, or rhonchi. No acute distress. Heart:  Regular rate and rhythm; no murmurs, clicks, rubs, or gallops. Abdomen:  Normal bowel sounds.  No bruits.  Soft, and non-distended without masses, hepatosplenomegaly or hernias noted.  Mild epigastric and bilateral lower abdominal tenderness.  No guarding or rebound tenderness.    Neurologic:  Alert and oriented x3;  grossly normal neurologically. Psych:  Alert and cooperative. Normal mood and  affect.  Imaging Studies: No results found.  Assessment and Plan:   Melissa Reese is a 23 y.o. y/o female has been referred for abdominal pain, abdominal bloating, constipation, iron deficiency and B12 deficiency anemia.  1.  Chronic iron deficiency anemia  Labs: CBC, iron panel, ferritin, and celiac lab.   Scheduling EGD & Colonoscopy I discussed risks of EGD and colonoscopy with patient to include risk of bleeding, perforation, and risk of sedation.  Patient expressed understanding and agrees to proceed with procedures.    2.  B12 deficiency  Lab: Vit B12  3.  Abdominal bloating, likely due to constipation.  Start Linzess.  4.  Chronic Constipation  Gave samples of Linzess 72 mcg QD for 1 week, then 145 mcg QD for 1 week.  She will let me know which dose works best, and then she can call me back for a prescription.   5.  Dyspepsia /  Early Satiety  Scheduling EGD I discussed risks of EGD with patient to include risk of bleeding, perforation, and risk of sedation.  Patient expressed understanding and agrees to proceed with EGD.   Follow up 4 weeks after EGD and colonoscopy.  Celso Amy, PA-C

## 2023-03-25 ENCOUNTER — Ambulatory Visit: Payer: Commercial Managed Care - PPO | Admitting: Physician Assistant

## 2023-03-25 ENCOUNTER — Encounter: Payer: Self-pay | Admitting: Internal Medicine

## 2023-03-25 ENCOUNTER — Encounter: Payer: Self-pay | Admitting: Physician Assistant

## 2023-03-25 ENCOUNTER — Telehealth: Payer: Self-pay | Admitting: Physician Assistant

## 2023-03-25 VITALS — BP 133/80 | HR 87 | Temp 98.6°F | Ht 62.0 in | Wt 163.6 lb

## 2023-03-25 DIAGNOSIS — R140 Abdominal distension (gaseous): Secondary | ICD-10-CM | POA: Diagnosis not present

## 2023-03-25 DIAGNOSIS — K5909 Other constipation: Secondary | ICD-10-CM | POA: Diagnosis not present

## 2023-03-25 DIAGNOSIS — E538 Deficiency of other specified B group vitamins: Secondary | ICD-10-CM

## 2023-03-25 DIAGNOSIS — D509 Iron deficiency anemia, unspecified: Secondary | ICD-10-CM | POA: Diagnosis not present

## 2023-03-25 DIAGNOSIS — R6881 Early satiety: Secondary | ICD-10-CM | POA: Diagnosis not present

## 2023-03-25 DIAGNOSIS — R1013 Epigastric pain: Secondary | ICD-10-CM

## 2023-03-25 DIAGNOSIS — K5904 Chronic idiopathic constipation: Secondary | ICD-10-CM

## 2023-03-25 NOTE — Telephone Encounter (Signed)
I sent message to patient's endocrinologist Dr. Elvera Lennox regarding colonoscopy/EGD prep instructions with insulin pump.  Here is Dr. Charlean Sanfilippo response:  Tyler Aas,  For patients on the pump, I usually recommend to continue the same basal rates, but not to bolus for meals unless they are eating regular meals and only do corrections if the sugars increase above 180.  They can resume taking boluses for meals when they start eating after the colonoscopy.  Sincerely,  Carlus Pavlov MD

## 2023-03-26 ENCOUNTER — Other Ambulatory Visit: Payer: Self-pay

## 2023-03-26 ENCOUNTER — Telehealth: Payer: Self-pay

## 2023-03-26 ENCOUNTER — Other Ambulatory Visit (HOSPITAL_COMMUNITY): Payer: Self-pay

## 2023-03-26 MED ORDER — PEG 3350-KCL-NA BICARB-NACL 420 G PO SOLR
4000.0000 mL | Freq: Once | ORAL | 0 refills | Status: AC
  Filled 2023-03-26 – 2023-04-09 (×2): qty 4000, 1d supply, fill #0

## 2023-03-26 NOTE — Addendum Note (Signed)
Addended by: Martyn Ehrich on: 03/26/2023 07:52 AM   Modules accepted: Orders

## 2023-03-26 NOTE — Telephone Encounter (Signed)
Patient notified.  Celso Amy, PA-C  Martyn Ehrich, [place] Call and notify patient her labs show: 1.  Very low hemoglobin 8.9.  Very low iron studies.  Labs are consistent with moderate iron deficiency anemia. 2.  Patient needs to start OTC ferrous sulfate iron 365 Mg 1 tablet twice daily with vitamin C.  If she is not able to tolerate iron pills, then we need to refer to hematology for IV iron infusion.  Advise patient to hold iron 5 days before EGD/colonoscopy procedures and restart iron after procedures. 3.  Vitamin B12 and folate are normal. 4.  We are still waiting on celiac lab results. Celso Amy, PA-C

## 2023-03-26 NOTE — Progress Notes (Signed)
Call and notify patient her labs show: 1.  Very low hemoglobin 8.9.  Very low iron studies.  Labs are consistent with moderate iron deficiency anemia. 2.  Patient needs to start OTC ferrous sulfate iron 365 Mg 1 tablet twice daily with vitamin C.  If she is not able to tolerate iron pills, then we need to refer to hematology for IV iron infusion.  Advise patient to hold iron 5 days before EGD/colonoscopy procedures and restart iron after procedures. 3.  Vitamin B12 and folate are normal. 4.  We are still waiting on celiac lab results. Celso Amy, PA-C

## 2023-03-26 NOTE — Patient Instructions (Signed)
Received message from Endocrinologist- scheduled EGd and Colonoscopy w/Dr.Anna -will call patient and let her know we received instructions on her insulin pump -mailed instructions to patient. Golytely sent to pharmacy.

## 2023-03-28 LAB — CBC WITH DIFFERENTIAL/PLATELET
Basophils Absolute: 0.1 10*3/uL (ref 0.0–0.2)
Basos: 1 %
EOS (ABSOLUTE): 0.2 10*3/uL (ref 0.0–0.4)
Eos: 2 %
Hematocrit: 29.4 % — ABNORMAL LOW (ref 34.0–46.6)
Hemoglobin: 8.9 g/dL — ABNORMAL LOW (ref 11.1–15.9)
Immature Grans (Abs): 0 10*3/uL (ref 0.0–0.1)
Immature Granulocytes: 0 %
Lymphocytes Absolute: 2.5 10*3/uL (ref 0.7–3.1)
Lymphs: 33 %
MCH: 21 pg — ABNORMAL LOW (ref 26.6–33.0)
MCHC: 30.3 g/dL — ABNORMAL LOW (ref 31.5–35.7)
MCV: 70 fL — ABNORMAL LOW (ref 79–97)
Monocytes Absolute: 0.3 10*3/uL (ref 0.1–0.9)
Monocytes: 5 %
Neutrophils Absolute: 4.5 10*3/uL (ref 1.4–7.0)
Neutrophils: 59 %
Platelets: 248 10*3/uL (ref 150–450)
RBC: 4.23 x10E6/uL (ref 3.77–5.28)
RDW: 16.6 % — ABNORMAL HIGH (ref 11.7–15.4)
WBC: 7.6 10*3/uL (ref 3.4–10.8)

## 2023-03-28 LAB — B12 AND FOLATE PANEL
Folate: 9 ng/mL (ref >3.0)
Vitamin B-12: 410 pg/mL (ref 232–1245)

## 2023-03-28 LAB — IRON,TIBC AND FERRITIN PANEL
Ferritin: 3 ng/mL — ABNORMAL LOW (ref 15–150)
Iron Saturation: 3 % — CL (ref 15–55)
Iron: 12 ug/dL — ABNORMAL LOW (ref 27–159)
Total Iron Binding Capacity: 465 ug/dL — ABNORMAL HIGH (ref 250–450)
UIBC: 453 ug/dL — ABNORMAL HIGH (ref 131–425)

## 2023-03-28 LAB — CELIAC DISEASE AB SCREEN W/RFX
Antigliadin Abs, IgA: 3 U (ref 0–19)
IgA/Immunoglobulin A, Serum: 167 mg/dL (ref 87–352)

## 2023-03-29 ENCOUNTER — Encounter: Payer: Self-pay | Admitting: Physician Assistant

## 2023-04-05 ENCOUNTER — Other Ambulatory Visit (HOSPITAL_COMMUNITY): Payer: Self-pay

## 2023-04-05 ENCOUNTER — Encounter (HOSPITAL_COMMUNITY): Payer: Self-pay

## 2023-04-07 ENCOUNTER — Other Ambulatory Visit (HOSPITAL_COMMUNITY): Payer: Self-pay

## 2023-04-09 ENCOUNTER — Other Ambulatory Visit (HOSPITAL_COMMUNITY): Payer: Self-pay

## 2023-04-16 ENCOUNTER — Ambulatory Visit: Payer: Commercial Managed Care - PPO

## 2023-04-17 ENCOUNTER — Ambulatory Visit
Admission: RE | Admit: 2023-04-17 | Discharge: 2023-04-17 | Disposition: A | Discharge status: Home / Self Care | Payer: Commercial Managed Care - PPO | Source: Ambulatory Visit | Attending: Nurse Practitioner | Admitting: Nurse Practitioner

## 2023-04-17 ENCOUNTER — Ambulatory Visit (INDEPENDENT_AMBULATORY_CARE_PROVIDER_SITE_OTHER): Payer: Commercial Managed Care - PPO

## 2023-04-17 VITALS — BP 120/81 | HR 82 | Temp 97.4°F | Resp 16

## 2023-04-17 DIAGNOSIS — S93401A Sprain of unspecified ligament of right ankle, initial encounter: Secondary | ICD-10-CM

## 2023-04-17 DIAGNOSIS — S99911A Unspecified injury of right ankle, initial encounter: Secondary | ICD-10-CM | POA: Diagnosis not present

## 2023-04-17 NOTE — Discharge Instructions (Signed)
 The x-ray of your right foot was negative for fracture or dislocation.  Symptoms are consistent with a sprain of your ankle.   May take over-the-counter ibuprofen or Tylenol for pain or discomfort. RICE therapy rest, ice, compression, and elevation until your symptoms improve.  Apply ice for 20 minutes, remove for 1 hour, then repeat as often as possible.  This will help with pain and swelling. Wear the ankle brace when you are engaged in strenuous or prolonged activity. Gentle range of motion exercises to help keep the joint mobile. If symptoms do not improve within the next 1 to 2 weeks, recommend following up with orthopedics.   Follow-up as needed.

## 2023-04-17 NOTE — ED Triage Notes (Signed)
 Reports while in zumba class she twisted her right ankle dancing x 2 days .

## 2023-04-17 NOTE — ED Provider Notes (Signed)
 [facility]    CSN: [id number] Arrival date & time: 04/17/23  1356      History   Chief Complaint Chief Complaint  Patient presents with   Ankle Pain    Entered by patient    HPI Melissa Reese is a 23 y.o. female.   The history is provided by the patient.   Patient presents for complaints of right ankle pain that started over the past 2 days.  Patient states she was attending a Zumba class, when she rolled the right ankle.  Since that time, she has had pain and swelling to the right ankle.  Patient states she is able to weight-bear, but does have some pain with doing so.  Denies numbness, tingling, or radiation of pain.    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 No pertinent surgical history.  OB History   No obstetric history on file.      Home Medications    Prior to Admission medications   Medication Sig Start Date End Date Taking? Authorizing Provider  ascorbic acid (VITAMIN C) 500 MG tablet Take by mouth.    [provider]  Continuous Glucose Transmitter (DEXCOM G6 TRANSMITTER) MISC Use every 3 (three) months. 08/31/22   Carlus Pavlov, MD  cyanocobalamin (VITAMIN B12) 1000 MCG/ML injection Patient to administer intramuscular 1 time weekly for 4 weeks, then 1 time monthly for 3 months. 03/04/23   Allwardt, Crist Infante, PA-C  Dapsone (ACZONE) 5 % topical gel  01/18/14   [provider]  ferrous sulfate 325 (65 FE) MG EC tablet Take by mouth.    [provider]  FLUCELVAX 0.5 ML injection  11/24/22   [provider]  Glucagon 3 MG/DOSE POWD PLACE 3 MG INTO THE NOSE ONCE AS NEEDED FOR UP TO 1 DOSE. 05/10/20 10/24/21  Carlus Pavlov, MD  glucose blood (ACCU-CHEK GUIDE) test strip Use to check blood sugar 4 times a day. 04/06/22   Carlus Pavlov, MD  Insulin Disposable Pump (OMNIPOD 5 DEXG7G6 PODS GEN 5) MISC Use as directed every 3 (three) days. 02/22/23   Carlus Pavlov, MD  Insulin Disposable Pump (OMNIPOD 5 G7 PODS, GEN 5,) MISC Inject 1 Device into the skin every 3 (three) days. 12/16/22   Carlus Pavlov, MD  insulin lispro (HUMALOG) 100 UNIT/ML injection Use up to 70 units via insulin pump. 03/16/23   Carlus Pavlov, MD  Insulin Lispro-aabc (LYUMJEV) 100 UNIT/ML SOLN Use up to 70 units via insulin pump. 03/10/23   Carlus Pavlov, MD  levothyroxine (SYNTHROID) 112 MCG tablet Take 1 tablet (112 mcg total) by mouth daily. 02/25/23   Carlus Pavlov, MD  Norgestimate-Ethinyl Estradiol Triphasic (TRI-VYLIBRA) 0.18/0.215/0.25 MG-35 MCG tablet Take 1 tablet by mouth daily. 02/09/23   Allwardt, Alyssa M, PA-C  SYRINGE-NEEDLE, DISP, 3 ML 25G X 1" 3 ML MISC Use to administer B-12 injections 1 time weekly for 4 weeks and then 1 time monthly for 3 months to complete series. 03/04/23   Allwardt, Crist Infante, PA-C    Family History Family History  Problem Relation  Age of Onset   Allergies Father    Diabetes Father     Social History Social History   Tobacco Use   Smoking status: Never   Smokeless tobacco: Never  Substance Use Topics   Alcohol use: No   Drug use: No     Allergies   Patient has no known allergies.   Review of Systems Review of Systems Per HPI  Physical Exam Triage Vital Signs ED Triage Vitals [04/17/23 1409]  Encounter Vitals Group     BP 120/81     Systolic BP Percentile      Diastolic BP Percentile      Pulse Rate 82     Resp 16     Temp (!) 97.4 F (36.3 C)     Temp Source Oral     SpO2 94 %     Weight      Height      Head Circumference      Peak Flow       Pain Score 8     Pain Loc      Pain Education      Exclude from Growth Chart    No data found.  Updated Vital Signs BP 120/81 (BP Location: Right Arm)   Pulse 82   Temp (!) 97.4 F (36.3 C) (Oral)   Resp 16   LMP 04/07/2023   SpO2 94%   Visual Acuity Right Eye Distance:   Left Eye Distance:   Bilateral Distance:    Right Eye Near:   Left Eye Near:    Bilateral Near:     Physical Exam Vitals and nursing note reviewed.  Constitutional:      General: She is not in acute distress.    Appearance: Normal appearance.  HENT:     Head: Normocephalic.  Eyes:     Extraocular Movements: Extraocular movements intact.     Pupils: Pupils are equal, round, and reactive to light.  Pulmonary:     Effort: Pulmonary effort is normal.  Musculoskeletal:     Cervical back: Normal range of motion.     Right ankle: Swelling present. No deformity or ecchymosis. Tenderness present over the lateral malleolus. Decreased range of motion (d/t pain). Normal pulse.     Comments: Neurovascular status is intact.  Skin:    General: Skin is warm and dry.  Neurological:     General: No focal deficit present.     Mental Status: She is alert and oriented to person, place, and time.  Psychiatric:        Mood and Affect: Mood normal.        Behavior: Behavior normal.      UC Treatments / Results  Labs (all labs ordered are listed, but only abnormal results are displayed) Labs Reviewed - No data to display  EKG   Radiology DG Ankle Complete Right Result Date: 04/17/2023 CLINICAL DATA:  twisted it dancing EXAM: RIGHT ANKLE - COMPLETE 3+ VIEW COMPARISON:  None Available. FINDINGS: No acute fracture or dislocation. Joint spaces and alignment are maintained. No area of erosion or osseous destruction. No unexpected radiopaque foreign body. Soft tissues are unremarkable. IMPRESSION: No acute fracture or dislocation. Electronically Signed   By: Meda Klinefelter M.D.   On: 04/17/2023 14:24     Procedures Procedures (including critical care time)  Medications Ordered in UC Medications - No data to display  Initial Impression / Assessment and Plan / UC Course  I have reviewed the triage vital  signs and the nursing notes.  Pertinent labs & imaging results that were available during my care of the patient were reviewed by me and considered in my medical decision making (see chart for details).  X-ray of the right ankle is negative for fracture or dislocation.  Lace up ankle brace provided to allow for additional compression and support.  Supportive care recommendations were provided and discussed with the patient to include RICE therapy, and over-the-counter analgesics.  Patient was given indications regarding follow-up, advised patient that if symptoms have not improved over the next 1 to 2 weeks, recommend following up with orthopedics for further evaluation.  Patient is in agreement with this plan of care and verbalizes understanding.  All questions were answered.  Patient stable for discharge.  Final Clinical Impressions(s) / UC Diagnoses   Final diagnoses:  Sprain of right ankle, unspecified ligament, initial encounter     Discharge Instructions      The x-ray of your right foot was negative for fracture or dislocation.  Symptoms are consistent with a sprain of your ankle.   May take over-the-counter ibuprofen or Tylenol for pain or discomfort. RICE therapy rest, ice, compression, and elevation until your symptoms improve.  Apply ice for 20 minutes, remove for 1 hour, then repeat as often as possible.  This will help with pain and swelling. Wear the ankle brace when you are engaged in strenuous or prolonged activity. Gentle range of motion exercises to help keep the joint mobile. If symptoms do not improve within the next 1 to 2 weeks, recommend following up with orthopedics.   Follow-up as needed.     ED Prescriptions   None    PDMP not reviewed this  encounter.   Abran Cantor, NP 04/17/23 1439

## 2023-04-28 ENCOUNTER — Other Ambulatory Visit: Payer: Self-pay | Admitting: Physician Assistant

## 2023-04-28 DIAGNOSIS — Z3041 Encounter for surveillance of contraceptive pills: Secondary | ICD-10-CM

## 2023-04-29 ENCOUNTER — Other Ambulatory Visit: Payer: Self-pay

## 2023-04-29 ENCOUNTER — Ambulatory Visit
Admission: RE | Admit: 2023-04-29 | Discharge: 2023-04-29 | Disposition: A | Discharge status: Home / Self Care | Payer: Commercial Managed Care - PPO | Attending: Gastroenterology | Admitting: Gastroenterology

## 2023-04-29 ENCOUNTER — Encounter: Payer: Self-pay | Admitting: Gastroenterology

## 2023-04-29 ENCOUNTER — Encounter
Admission: RE | Disposition: A | Discharge status: Home / Self Care | Payer: Self-pay | Source: Home / Self Care | Attending: Gastroenterology

## 2023-04-29 ENCOUNTER — Other Ambulatory Visit (HOSPITAL_COMMUNITY): Payer: Self-pay

## 2023-04-29 ENCOUNTER — Ambulatory Visit: Payer: Commercial Managed Care - PPO | Admitting: Anesthesiology

## 2023-04-29 DIAGNOSIS — E039 Hypothyroidism, unspecified: Secondary | ICD-10-CM | POA: Diagnosis not present

## 2023-04-29 DIAGNOSIS — D509 Iron deficiency anemia, unspecified: Secondary | ICD-10-CM | POA: Diagnosis not present

## 2023-04-29 DIAGNOSIS — Z79890 Hormone replacement therapy (postmenopausal): Secondary | ICD-10-CM | POA: Insufficient documentation

## 2023-04-29 DIAGNOSIS — E119 Type 2 diabetes mellitus without complications: Secondary | ICD-10-CM | POA: Diagnosis not present

## 2023-04-29 DIAGNOSIS — Z794 Long term (current) use of insulin: Secondary | ICD-10-CM | POA: Diagnosis not present

## 2023-04-29 HISTORY — PX: COLONOSCOPY WITH PROPOFOL: SHX5780

## 2023-04-29 HISTORY — PX: ESOPHAGOGASTRODUODENOSCOPY (EGD) WITH PROPOFOL: SHX5813

## 2023-04-29 LAB — POCT PREGNANCY, URINE: Preg Test, Ur: NEGATIVE

## 2023-04-29 SURGERY — ESOPHAGOGASTRODUODENOSCOPY (EGD) WITH PROPOFOL
Anesthesia: General

## 2023-04-29 MED ORDER — PROPOFOL 500 MG/50ML IV EMUL
INTRAVENOUS | Status: DC | PRN
  Administered 2023-04-29: 200 ug/kg/min via INTRAVENOUS

## 2023-04-29 MED ORDER — NORGESTIM-ETH ESTRAD TRIPHASIC 0.18/0.215/0.25 MG-35 MCG PO TABS
1.0000 | ORAL_TABLET | Freq: Every day | ORAL | 0 refills | Status: DC
  Filled 2023-04-29: qty 84, 84d supply, fill #0

## 2023-04-29 MED ORDER — DEXMEDETOMIDINE HCL IN NACL 80 MCG/20ML IV SOLN
INTRAVENOUS | Status: DC | PRN
  Administered 2023-04-29: 8 ug via INTRAVENOUS

## 2023-04-29 MED ORDER — LIDOCAINE 2% (20 MG/ML) 5 ML SYRINGE
INTRAMUSCULAR | Status: DC | PRN
  Administered 2023-04-29: 100 mg via INTRAVENOUS

## 2023-04-29 MED ORDER — PROPOFOL 10 MG/ML IV BOLUS
INTRAVENOUS | Status: DC | PRN
  Administered 2023-04-29: 50 mg via INTRAVENOUS
  Administered 2023-04-29: 100 mg via INTRAVENOUS

## 2023-04-29 MED ORDER — SODIUM CHLORIDE 0.9 % IV SOLN: INTRAVENOUS | Status: DC

## 2023-04-29 NOTE — Anesthesia Preprocedure Evaluation (Signed)
 Anesthesia Evaluation  Patient identified by MRN, date of birth, ID band Patient awake    Reviewed: Allergy & Precautions, NPO status , Patient's Chart, lab work & pertinent test results  Airway Mallampati: II  TM Distance: >3 FB Neck ROM: full    Dental  (+) Teeth Intact   Pulmonary neg pulmonary ROS   Pulmonary exam normal        Cardiovascular Exercise Tolerance: Good negative cardio ROS Normal cardiovascular exam Rhythm:Regular Rate:Normal     Neuro/Psych negative neurological ROS  negative psych ROS   GI/Hepatic negative GI ROS, Neg liver ROS,,,  Endo/Other  negative endocrine ROSdiabetes, Well Controlled, Type 1Hypothyroidism    Renal/GU negative Renal ROS  negative genitourinary   Musculoskeletal   Abdominal Normal abdominal exam  (+)   Peds negative pediatric ROS (+)  Hematology negative hematology ROS (+)   Anesthesia Other Findings Past Medical History: 2018: Anemia No date: Diabetes mellitus without complication (HCC) No date: Heart murmur No date: Thyroid disease  History reviewed. No pertinent surgical history.  BMI    Body Mass Index: 29.45 kg/m      Reproductive/Obstetrics negative OB ROS                             Anesthesia Physical Anesthesia Plan  ASA: 2  Anesthesia Plan: General   Post-op Pain Management:    Induction: Intravenous  PONV Risk Score and Plan: Propofol infusion and TIVA  Airway Management Planned: Natural Airway and Nasal Cannula  Additional Equipment:   Intra-op Plan:   Post-operative Plan:   Informed Consent: I have reviewed the patients History and Physical, chart, labs and discussed the procedure including the risks, benefits and alternatives for the proposed anesthesia with the patient or authorized representative who has indicated his/her understanding and acceptance.     Dental Advisory Given  Plan Discussed with:  CRNA  Anesthesia Plan Comments:        Anesthesia Quick Evaluation

## 2023-04-29 NOTE — Anesthesia Postprocedure Evaluation (Signed)
 Anesthesia Post Note  Patient: Melissa Reese  Procedure(s) Performed: ESOPHAGOGASTRODUODENOSCOPY (EGD) WITH PROPOFOL COLONOSCOPY WITH PROPOFOL  Patient location during evaluation: PACU Anesthesia Type: General Level of consciousness: awake and awake and alert Pain management: satisfactory to patient Vital Signs Assessment: post-procedure vital signs reviewed and stable Respiratory status: spontaneous breathing and nonlabored ventilation Cardiovascular status: blood pressure returned to baseline Anesthetic complications: no   No notable events documented.   Last Vitals:  Vitals:   04/29/23 1103 04/29/23 1112  BP: 101/64 109/71  Pulse: 78 78  Resp: (!) 22 20  Temp: (!) 35.8 C   SpO2: 96% 100%    Last Pain:  Vitals:   04/29/23 1112  TempSrc:   PainSc: 0-No pain                 VAN STAVEREN,Xaniyah Buchholz

## 2023-04-29 NOTE — Op Note (Signed)
 [facility] Gastroenterology Patient Name: Melissa Reese Procedure Date: 04/29/2023 10:33 AM MRN: [id number] Account #: [id number] Date of Birth: 2000-09-23 Admit Type: Outpatient Age: 23 Room: [facility] ENDO ROOM 2 Gender: Female Note Status: Finalized Instrument Name: Laurette Schimke [id number] Procedure:             Upper GI endoscopy Indications:           Iron deficiency anemia Providers:             Wyline Mood MD, MD Referring MD:          Ila Mcgill (Referring MD) Medicines:             Monitored Anesthesia Care Complications:         No immediate complications. Procedure:             Pre-Anesthesia Assessment:                        - Prior to the procedure, a History and Physical was                         performed, and patient medications, allergies and                         sensitivities were reviewed. The patient's tolerance                         of previous anesthesia was reviewed.                        - The risks and benefits of the procedure and the                         sedation options and risks were discussed with the                         patient. All questions were answered and informed                         consent was obtained.                        - ASA Grade Assessment: II - A patient with mild                         systemic disease.                        After obtaining informed consent, the endoscope was                         passed under direct vision. Throughout the procedure,                         the patient's blood pressure, pulse, and oxygen                         saturations were monitored continuously. The Endoscope                         was introduced through  the mouth, and advanced to the                         third part of duodenum. The upper GI endoscopy was                         accomplished with ease. The patient tolerated the                         procedure well. Findings:      The  esophagus was normal.      The stomach was normal.      The examined duodenum was normal. Impression:            - Normal esophagus.                        - Normal stomach.                        - Normal examined duodenum.                        - No specimens collected. Recommendation:        - Perform a colonoscopy today. Procedure Code(s):     --- Professional ---                        [phone], Esophagogastroduodenoscopy, flexible,                         transoral; diagnostic, including collection of                         specimen(s) by brushing or washing, when performed                         (separate procedure) Diagnosis Code(s):     --- Professional ---                        D50.9, Iron deficiency anemia, unspecified CPT copyright 2022 American Medical Association. All rights reserved. The codes documented in this report are preliminary and upon coder review may  be revised to meet current compliance requirements. Wyline Mood, MD Wyline Mood MD, MD 04/29/2023 10:47:34 AM This report has been signed electronically. Number of Addenda: 0 Note Initiated On: 04/29/2023 10:33 AM Estimated Blood Loss:  Estimated blood loss: none.      [facility]

## 2023-04-29 NOTE — Op Note (Signed)
 [facility] Gastroenterology Patient Name: Melissa Reese Procedure Date: 04/29/2023 10:32 AM MRN: [id number] Account #: [id number] Date of Birth: 2000/08/17 Admit Type: Outpatient Age: 23 Room: [facility] ENDO ROOM 2 Gender: Female Note Status: Finalized Instrument Name: Prentice Docker [id number] Procedure:             Colonoscopy Indications:           Iron deficiency anemia Providers:             Wyline Mood MD, MD Referring MD:          Ila Mcgill (Referring MD) Medicines:             Monitored Anesthesia Care Complications:         No immediate complications. Procedure:             Pre-Anesthesia Assessment:                        - Prior to the procedure, a History and Physical was                         performed, and patient medications, allergies and                         sensitivities were reviewed. The patient's tolerance                         of previous anesthesia was reviewed.                        - The risks and benefits of the procedure and the                         sedation options and risks were discussed with the                         patient. All questions were answered and informed                         consent was obtained.                        - ASA Grade Assessment: II - A patient with mild                         systemic disease.                        After obtaining informed consent, the colonoscope was                         passed under direct vision. Throughout the procedure,                         the patient's blood pressure, pulse, and oxygen                         saturations were monitored continuously. The                         Colonoscope was introduced through the anus  and                         advanced to the the cecum, identified by the                         appendiceal orifice. The colonoscopy was performed                         with ease. The patient tolerated the procedure well.                          The quality of the bowel preparation was excellent.                         The terminal ileum, ileocecal valve, appendiceal                         orifice, and rectum were photographed. Findings:      The terminal ileum appeared normal.      The entire examined colon appeared normal on direct and retroflexion       views. Impression:            - The examined portion of the ileum was normal.                        - The entire examined colon is normal on direct and                         retroflexion views.                        - No specimens collected. Recommendation:        - Discharge patient to home (with escort).                        - Resume previous diet.                        - Continue present medications.                        - To visualize the small bowel, perform video capsule                         endoscopy in 4 weeks.                        - Return to GI office as previously scheduled. Procedure Code(s):     --- Professional ---                        [phone], Colonoscopy, flexible; diagnostic, including                         collection of specimen(s) by brushing or washing, when                         performed (separate procedure) Diagnosis Code(s):     --- Professional ---  D50.9, Iron deficiency anemia, unspecified CPT copyright 2022 American Medical Association. All rights reserved. The codes documented in this report are preliminary and upon coder review may  be revised to meet current compliance requirements. Wyline Mood, MD Wyline Mood MD, MD 04/29/2023 11:00:38 AM This report has been signed electronically. Number of Addenda: 0 Note Initiated On: 04/29/2023 10:32 AM Scope Withdrawal Time: 0 hours 5 minutes 40 seconds  Total Procedure Duration: 0 hours 8 minutes 8 seconds  Estimated Blood Loss:  Estimated blood loss: none.      [facility]

## 2023-04-29 NOTE — Transfer of Care (Signed)
 Immediate Anesthesia Transfer of Care Note  Patient: Melissa Reese  Procedure(s) Performed: ESOPHAGOGASTRODUODENOSCOPY (EGD) WITH PROPOFOL COLONOSCOPY WITH PROPOFOL  Patient Location: Endoscopy Unit  Anesthesia Type:General  Level of Consciousness: awake, alert , and oriented  Airway & Oxygen Therapy: Patient Spontanous Breathing  Post-op Assessment: Report given to RN and Post -op Vital signs reviewed and stable  Post vital signs: Reviewed and stable  Last Vitals:  Vitals Value Taken Time  BP 101/64 04/29/23 1103  Temp 35.8 C 04/29/23 1103  Pulse 78 04/29/23 1103  Resp 21 04/29/23 1105  SpO2    Vitals shown include unfiled device data.  Last Pain:  Vitals:   04/29/23 1103  TempSrc: Temporal  PainSc: 0-No pain         Complications: No notable events documented.

## 2023-04-29 NOTE — H&P (Signed)
 Wyline Mood, MD [address], [place], [place], [address] [address], [place], [place], [address] Phone: [phone]  Fax: [phone]  Primary Care Physician:  Allwardt, Crist Infante, PA-C   Pre-Procedure History & Physical: HPI:  Melissa Reese is a 23 y.o. female is here for an endoscopy and colonoscopy    Past Medical History:  Diagnosis Date   Anemia 2018   Diabetes mellitus without complication (HCC)    Heart murmur    Thyroid disease     History reviewed. No pertinent surgical history.  Prior to Admission medications   Medication Sig Start Date End Date Taking? Authorizing Provider  ferrous sulfate 325 (65 FE) MG EC tablet Take by mouth.   Yes [provider]  levothyroxine (SYNTHROID) 112 MCG tablet Take 1 tablet (112 mcg total) by mouth daily. 02/25/23  Yes Carlus Pavlov, MD  ascorbic acid (VITAMIN C) 500 MG tablet Take by mouth.    [provider]  Continuous Glucose Transmitter (DEXCOM G6 TRANSMITTER) MISC Use every 3 (three) months. 08/31/22   Carlus Pavlov, MD  cyanocobalamin (VITAMIN B12) 1000 MCG/ML injection Patient to administer intramuscular 1 time weekly for 4 weeks, then 1 time monthly for 3 months. 03/04/23   Allwardt, Crist Infante, PA-C  Dapsone (ACZONE) 5 % topical gel  01/18/14   [provider]  FLUCELVAX 0.5 ML injection  11/24/22   [provider]  Glucagon 3 MG/DOSE POWD PLACE 3 MG INTO THE NOSE ONCE AS NEEDED FOR UP TO 1 DOSE. 05/10/20 10/24/21  Carlus Pavlov, MD  glucose blood (ACCU-CHEK GUIDE) test strip Use to check blood sugar 4 times a day. 04/06/22   Carlus Pavlov, MD  Insulin Disposable Pump (OMNIPOD 5 DEXG7G6 PODS GEN 5) MISC Use as directed every 3 (three) days. 02/22/23   Carlus Pavlov, MD  Insulin Disposable Pump (OMNIPOD 5 G7 PODS, GEN 5,) MISC Inject 1 Device into the skin every 3 (three) days. 12/16/22   Carlus Pavlov, MD  insulin lispro (HUMALOG) 100 UNIT/ML  injection Use up to 70 units via insulin pump. 03/16/23   Carlus Pavlov, MD  Insulin Lispro-aabc (LYUMJEV) 100 UNIT/ML SOLN Use up to 70 units via insulin pump. 03/10/23   Carlus Pavlov, MD  Norgestimate-Ethinyl Estradiol Triphasic (TRI-VYLIBRA) 0.18/0.215/0.25 MG-35 MCG tablet Take 1 tablet by mouth daily. 02/09/23   Allwardt, Alyssa M, PA-C  SYRINGE-NEEDLE, DISP, 3 ML 25G X 1" 3 ML MISC Use to administer B-12 injections 1 time weekly for 4 weeks and then 1 time monthly for 3 months to complete series. 03/04/23   Allwardt, Crist Infante, PA-C    Allergies as of 03/26/2023   (No Known Allergies)    Family History  Problem Relation Age of Onset   Allergies Father    Diabetes Father     Social History   Socioeconomic History   Marital status: Single    Spouse name: Not on file   Number of children: Not on file   Years of education: Not on file   Highest education level: Not on file  Occupational History   Not on file  Tobacco Use   Smoking status: Never   Smokeless tobacco: Never  Substance and Sexual Activity   Alcohol use: No   Drug use: No   Sexual activity: Yes    Birth control/protection: Condom, Pill  Other Topics Concern   Not on file  Social History Narrative   Not on file   Social Drivers  of Health   Financial Resource Strain: Not on file  Food Insecurity: Not on file  Transportation Needs: Not on file  Physical Activity: Not on file  Stress: Not on file  Social Connections: Not on file  Intimate Partner Violence: Not on file    Review of Systems: See HPI, otherwise negative ROS  Physical Exam: LMP 04/07/2023  General:   Alert,  pleasant and cooperative in NAD Head:  Normocephalic and atraumatic. Neck:  Supple; no masses or thyromegaly. Lungs:  Clear throughout to auscultation, normal respiratory effort.    Heart:  +S1, +S2, Regular rate and rhythm, No edema. Abdomen:  Soft, nontender and nondistended. Normal bowel sounds, without guarding, and without  rebound.   Neurologic:  Alert and  oriented x4;  grossly normal neurologically.  Impression/Plan: Melissa Reese is here for an endoscopy and colonoscopy  to be performed for  evaluation of iron deficiency anemia    Risks, benefits, limitations, and alternatives regarding endoscopy have been reviewed with the patient.  Questions have been answered.  All parties agreeable.   Wyline Mood, MD  04/29/2023, 9:50 AM

## 2023-04-30 ENCOUNTER — Encounter: Payer: Self-pay | Admitting: Internal Medicine

## 2023-04-30 ENCOUNTER — Other Ambulatory Visit (HOSPITAL_COMMUNITY): Payer: Self-pay

## 2023-04-30 ENCOUNTER — Encounter: Payer: Self-pay | Admitting: Gastroenterology

## 2023-04-30 MED ORDER — DEXCOM G7 SENSOR MISC
3 refills | Status: AC
  Filled 2023-04-30: qty 9, fill #0
  Filled 2023-05-14 – 2023-05-26 (×4): qty 9, 90d supply, fill #0
  Filled 2023-08-24: qty 9, 90d supply, fill #1
  Filled 2023-11-22: qty 9, 90d supply, fill #2
  Filled 2024-02-06 – 2024-02-09 (×3): qty 9, 90d supply, fill #3

## 2023-05-14 ENCOUNTER — Other Ambulatory Visit (HOSPITAL_COMMUNITY): Payer: Self-pay

## 2023-05-17 ENCOUNTER — Encounter (HOSPITAL_COMMUNITY): Payer: Self-pay | Admitting: Pharmacist

## 2023-05-17 ENCOUNTER — Other Ambulatory Visit (HOSPITAL_COMMUNITY): Payer: Self-pay

## 2023-05-21 ENCOUNTER — Ambulatory Visit: Payer: Commercial Managed Care - PPO | Admitting: Physician Assistant

## 2023-05-24 ENCOUNTER — Encounter (HOSPITAL_COMMUNITY): Payer: Self-pay

## 2023-05-24 ENCOUNTER — Other Ambulatory Visit (HOSPITAL_COMMUNITY): Payer: Self-pay

## 2023-05-26 ENCOUNTER — Encounter: Payer: Self-pay | Admitting: Pharmacist

## 2023-05-26 ENCOUNTER — Other Ambulatory Visit: Payer: Self-pay

## 2023-05-26 ENCOUNTER — Other Ambulatory Visit (HOSPITAL_COMMUNITY): Payer: Self-pay

## 2023-05-27 ENCOUNTER — Telehealth: Payer: Self-pay

## 2023-05-27 ENCOUNTER — Other Ambulatory Visit (HOSPITAL_COMMUNITY): Payer: Self-pay

## 2023-05-27 NOTE — Telephone Encounter (Signed)
 Left message for patient to return call to office so we can schedule Capsule study per Dr.Anna notes from Colonoscopy

## 2023-05-28 NOTE — Telephone Encounter (Signed)
 LVM to call office so we cn schedule Capsule Endoscopy per Dr.Anna .

## 2023-05-28 NOTE — Telephone Encounter (Signed)
 The patient called back in to request to speak with [facility].

## 2023-05-31 ENCOUNTER — Telehealth: Payer: Self-pay

## 2023-05-31 ENCOUNTER — Other Ambulatory Visit: Payer: Self-pay

## 2023-05-31 DIAGNOSIS — D509 Iron deficiency anemia, unspecified: Secondary | ICD-10-CM

## 2023-05-31 NOTE — Telephone Encounter (Signed)
 Spoke with patient- Video capsule study scheduled 06/21/23- We discussed the prep instructions also will be mailed to patient-

## 2023-06-07 ENCOUNTER — Other Ambulatory Visit (HOSPITAL_COMMUNITY): Payer: Self-pay

## 2023-06-07 ENCOUNTER — Ambulatory Visit (INDEPENDENT_AMBULATORY_CARE_PROVIDER_SITE_OTHER)

## 2023-06-07 ENCOUNTER — Ambulatory Visit: Payer: Self-pay | Admitting: Podiatry

## 2023-06-07 DIAGNOSIS — M25571 Pain in right ankle and joints of right foot: Secondary | ICD-10-CM | POA: Diagnosis not present

## 2023-06-07 DIAGNOSIS — M79671 Pain in right foot: Secondary | ICD-10-CM | POA: Diagnosis not present

## 2023-06-07 DIAGNOSIS — M25471 Effusion, right ankle: Secondary | ICD-10-CM

## 2023-06-07 DIAGNOSIS — M7671 Peroneal tendinitis, right leg: Secondary | ICD-10-CM | POA: Diagnosis not present

## 2023-06-07 MED ORDER — MELOXICAM 15 MG PO TABS
15.0000 mg | ORAL_TABLET | Freq: Every day | ORAL | 1 refills | Status: AC
  Filled 2023-06-07: qty 30, 30d supply, fill #0

## 2023-06-07 NOTE — Progress Notes (Unsigned)
 Chief Complaint  Patient presents with   Foot Pain    Right foot pain pt stated that she had xrays done back in February and was told that it could be just a sprain but its not getting any better    HPI: 23 y.o. female presents today with concern of right ankle pain x 2 to 3 months.  She was seen at urgent care in February and informed that x-rays were negative for fracture.  She was fitted for a brace and has been wearing this without any improvement.  She has been taking nonsteroidal anti-inflammatory medications which have provided minimal improvement.  She presents today noting chronic pain and swelling to the area which is affecting her ability to ambulate for any prolonged period of time.  Past Medical History:  Diagnosis Date   Anemia 2018   Diabetes mellitus without complication (HCC)    Heart murmur    Thyroid disease     Past Surgical History:  Procedure Laterality Date   COLONOSCOPY WITH PROPOFOL N/A 04/29/2023   Procedure: COLONOSCOPY WITH PROPOFOL;  Surgeon: Wyline Mood, MD;  Location: [facility] ENDOSCOPY;  Service: Gastroenterology;  Laterality: N/A;   ESOPHAGOGASTRODUODENOSCOPY (EGD) WITH PROPOFOL N/A 04/29/2023   Procedure: ESOPHAGOGASTRODUODENOSCOPY (EGD) WITH PROPOFOL;  Surgeon: Wyline Mood, MD;  Location: [facility] ENDOSCOPY;  Service: Gastroenterology;  Laterality: N/A;   No Known Allergies  Review of Systems  Musculoskeletal:  Positive for joint pain.     Physical Exam: There are palpable pedal pulses noted.  No erythema or calor is noted.  No ecchymosis is noted.  There is localized edema to the lateral aspect of the right ankle.  There is pain on palpation of the peroneal tendons as they course around the inferior aspect of the fibula.  Resisted eversion reproduces symptoms.  No gaps or nodules are noted within the posterior tibial tendon.  No crepitus with range of motion of the ankle or subtalar joint.  Mild discomfort along the lateral ankle ligaments, mostly the ATFL  and CFL.  No pain on palpation of the Achilles tendon.  Antalgic gait noted.  Epicritic sensation intact  Radiographic Exam (right foot 3 views and right ankle 2 views, weightbearing, 06/07/2023):  Normal osseous mineralization. Joint spaces preserved.  No fractures noted.  Bipartite tibial sesamoid.  Medial angulation of 4th and 5th toes at the DIPJ level.  Ankle mortise is intact  Assessment/Plan of Care: 1. Peroneal tendonitis, right   2. Right foot pain   3. Pain and swelling of right ankle      Meds ordered this encounter  Medications   meloxicam (MOBIC) 15 MG tablet    Sig: Take 1 tablet (15 mg total) by mouth daily.    Dispense:  30 tablet    Refill:  1   DG ANKLE 2 VIEWS RIGHT PR PNEUMAT WALKING BOOT PRE CST MR ANKLE RIGHT WO CONTRAST  Discussed clinical and radiographic findings with patient today.  Since the patient has been taking some anti-inflammatories and had partial immobilization in a brace with no significant improvement over the past 2 months, will proceed with MRI of the right ankle to evaluate the peroneal tendons and lateral ankle ligaments for pathology.  Patient request that the MRI be performed at [facility]  Due to the continued pain, swelling and limited ambulation, will place the patient in a pneumatic cam walker for the right foot.  She will wear this at all times weightbearing for the next few weeks while  awaiting the MRI and results.  Prescription for meloxicam 15 mg 1 tab p.o. daily x 30 days sent to her pharmacy.  Will contact patient after the MRI to discuss findings and proceed with treatment options at that time.  Clerance Lav, DPM, FACFAS [facility]     [address] [place], [place] [address]                Office [phone]  Fax [phone]

## 2023-06-08 ENCOUNTER — Other Ambulatory Visit: Payer: Self-pay | Admitting: Podiatry

## 2023-06-08 DIAGNOSIS — M79671 Pain in right foot: Secondary | ICD-10-CM

## 2023-06-08 DIAGNOSIS — M7671 Peroneal tendinitis, right leg: Secondary | ICD-10-CM

## 2023-06-17 ENCOUNTER — Other Ambulatory Visit: Payer: Self-pay

## 2023-06-17 ENCOUNTER — Other Ambulatory Visit (HOSPITAL_COMMUNITY): Payer: Self-pay

## 2023-06-17 ENCOUNTER — Ambulatory Visit: Payer: Commercial Managed Care - PPO | Admitting: Physician Assistant

## 2023-06-21 ENCOUNTER — Encounter
Admission: RE | Disposition: A | Discharge status: Home / Self Care | Payer: Self-pay | Source: Home / Self Care | Attending: Gastroenterology

## 2023-06-21 ENCOUNTER — Encounter: Payer: Self-pay | Admitting: Anesthesiology

## 2023-06-21 ENCOUNTER — Ambulatory Visit
Admission: RE | Admit: 2023-06-21 | Discharge: 2023-06-21 | Disposition: A | Discharge status: Home / Self Care | Attending: Gastroenterology | Admitting: Gastroenterology

## 2023-06-21 DIAGNOSIS — D509 Iron deficiency anemia, unspecified: Secondary | ICD-10-CM | POA: Insufficient documentation

## 2023-06-21 HISTORY — PX: GIVENS CAPSULE STUDY: SHX5432

## 2023-06-21 SURGERY — IMAGING PROCEDURE, GI TRACT, INTRALUMINAL, VIA CAPSULE

## 2023-06-21 MED ORDER — SODIUM CHLORIDE 0.9 % IV SOLN: INTRAVENOUS | Status: DC

## 2023-06-21 NOTE — Anesthesia Preprocedure Evaluation (Deleted)
 Anesthesia Evaluation  Patient identified by MRN, date of birth, ID band Patient awake    Reviewed: Allergy & Precautions, NPO status , Patient's Chart, lab work & pertinent test results  Airway Mallampati: II  TM Distance: >3 FB Neck ROM: full    Dental  (+) Teeth Intact   Pulmonary neg pulmonary ROS   Pulmonary exam normal        Cardiovascular Exercise Tolerance: Good negative cardio ROS Normal cardiovascular exam Rhythm:Regular Rate:Normal     Neuro/Psych negative neurological ROS  negative psych ROS   GI/Hepatic negative GI ROS, Neg liver ROS,,,  Endo/Other  diabetes, Well Controlled, Type 1Hypothyroidism    Renal/GU negative Renal ROS  negative genitourinary   Musculoskeletal   Abdominal Normal abdominal exam  (+)   Peds negative pediatric ROS (+)  Hematology negative hematology ROS (+)   Anesthesia Other Findings Past Medical History: 2018: Anemia No date: Diabetes mellitus without complication (HCC) No date: Heart murmur No date: Thyroid  disease   BMI    Body Mass Index: 29.45 kg/m      Reproductive/Obstetrics negative OB ROS                              Anesthesia Physical Anesthesia Plan  ASA: 2  Anesthesia Plan: General   Post-op Pain Management:    Induction: Intravenous  PONV Risk Score and Plan: Propofol  infusion and TIVA  Airway Management Planned: Natural Airway and Nasal Cannula  Additional Equipment:   Intra-op Plan:   Post-operative Plan:   Informed Consent:      Dental Advisory Given  Plan Discussed with: CRNA  Anesthesia Plan Comments:         Anesthesia Quick Evaluation

## 2023-06-22 ENCOUNTER — Encounter: Payer: Self-pay | Admitting: Gastroenterology

## 2023-06-23 ENCOUNTER — Encounter: Payer: Commercial Managed Care - PPO | Admitting: Physician Assistant

## 2023-06-25 ENCOUNTER — Encounter: Payer: Self-pay | Admitting: Internal Medicine

## 2023-06-25 ENCOUNTER — Ambulatory Visit: Payer: Commercial Managed Care - PPO | Admitting: Internal Medicine

## 2023-06-25 ENCOUNTER — Other Ambulatory Visit (HOSPITAL_COMMUNITY): Payer: Self-pay

## 2023-06-25 VITALS — BP 114/76 | HR 76 | Ht 62.0 in | Wt 165.0 lb

## 2023-06-25 DIAGNOSIS — Z9641 Presence of insulin pump (external) (internal): Secondary | ICD-10-CM | POA: Diagnosis not present

## 2023-06-25 DIAGNOSIS — E1065 Type 1 diabetes mellitus with hyperglycemia: Secondary | ICD-10-CM | POA: Diagnosis not present

## 2023-06-25 DIAGNOSIS — E039 Hypothyroidism, unspecified: Secondary | ICD-10-CM | POA: Diagnosis not present

## 2023-06-25 DIAGNOSIS — E1340 Other specified diabetes mellitus with diabetic neuropathy, unspecified: Secondary | ICD-10-CM | POA: Diagnosis not present

## 2023-06-25 DIAGNOSIS — E1042 Type 1 diabetes mellitus with diabetic polyneuropathy: Secondary | ICD-10-CM | POA: Diagnosis not present

## 2023-06-25 LAB — POCT GLYCOSYLATED HEMOGLOBIN (HGB A1C): Hemoglobin A1C: 6.3 % — AB (ref 4.0–5.6)

## 2023-06-25 MED ORDER — SEMAGLUTIDE(0.25 OR 0.5MG/DOS) 2 MG/3ML ~~LOC~~ SOPN
0.5000 mg | PEN_INJECTOR | SUBCUTANEOUS | 3 refills | Status: AC
  Filled 2023-06-25 – 2023-06-29 (×2): qty 3, 28d supply, fill #0
  Filled 2023-07-28: qty 3, 28d supply, fill #1
  Filled 2023-08-24: qty 3, 28d supply, fill #2
  Filled 2023-09-19: qty 3, 28d supply, fill #3
  Filled 2023-10-25: qty 3, 28d supply, fill #4
  Filled 2023-11-17: qty 3, 28d supply, fill #5
  Filled 2023-12-13 – 2024-01-05 (×2): qty 3, 28d supply, fill #6
  Filled 2024-02-07: qty 3, 28d supply, fill #7
  Filled ????-??-??: fill #5

## 2023-06-25 NOTE — Patient Instructions (Addendum)
 Please use the following pump settings: - basal rates: 12 am: 1.25 3 am: 1.15 6 am: 1.15 11 am: 1.20 5 pm: 1.15 9 pm: 1.30 - ICR:   12 am: 9  5 am: 6  3 pm : 6 - target:  12 am: 110-120 - ISF:  12 am: 45  5:30 am: 45  9:30 pm: 45 - Insulin  on Board: 4h - bolus wizard: on   Try to start the meal with fiber, protein and fat and end with carbs. Enter 40-50% protein grams for a low carb meal.  Try to move dinners earlier.  Please do the following 15 min before a meal: - Enter all carbs (C) - Enter sugars (S) - Start insulin  bolus (I)  Please start Ozempic  0.25 mg weekly in a.m. (for example on Sunday morning) x 4 weeks, then increase to 0.5 mg weekly in a.m. if no nausea or hypoglycemia.    Please continue Levothyroxine  112 mcg daily.  Take the thyroid  hormone every day, with water, at least 30 minutes before breakfast, separated by at least 4 hours from: - acid reflux medications - calcium - iron - multivitamins   Please return in 4 months.

## 2023-06-25 NOTE — Progress Notes (Signed)
 Patient ID: Melissa Reese, female   DOB: 12/14/2000, 23 y.o.   MRN: [id number]   HPI: Melissa Reese is a 23 y.o.-year-old very pleasant female, initially referred by her PCP, Dr. 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 Prev. seen at [facility]. Last visit with me 4 months ago.  Interim history: No increased urination, blurry vision, nausea, chest pain.   She changed her job before last visit.  She was working longer hours and did not have time to exercise.  Sugars were higher.  She is currently not exercising as she developed left ankle pain >> seeing podiatry.  She is preparing to have an MRI of her ankle. She had EGD, colonoscopy and also capsule endoscopy to investigate for anemia.  The first 2 studies were normal, but she is awaiting for the results of the capsule endoscopy.  Of note, her hemoglobin was 8.9 in 03/2023.  Insulin  pump: - Started at 23 years old - OneTouch ping - started 01/2015 - Medtronic 670 G insulin  pump + guardian CGM-started 09/2016 (band aid to keep the CGM in place: Simplatch). She had a crack in her pump -received another pump 01/2018.   - Medtronic 770 G started 01/2019 - renewed 03/2021.   - now OmniPod 5 pump - started 10/2022 (3 mo 326$)  Meter:  -Accu-Chek guide link - $$ under new insurance  CGM: -Medtronic Guardian -now Dexcom G7 (300$ for every 3 mo)  Insulin : -FiAsp  (PA approved) >> 198$ for 3 mo -tried Lyumjev  but $ - now Lispro  DM1:  Reviewed HbA1c levels: Lab Results  Component Value Date   HGBA1C 7.1 (A) 02/22/2023   HGBA1C 6.9 12/15/2022   HGBA1C 6.5 (A) 10/20/2022   HGBA1C 7.6 (A) 02/17/2022   HGBA1C 7.3 (A) 10/17/2021   HGBA1C 7.5 (A) 05/29/2021   HGBA1C 7.5 (A) 01/27/2021   HGBA1C 7.4 (A) 09/20/2020   HGBA1C 7.5 (A) 05/10/2020   HGBA1C 7.2 (A) 01/10/2020   HGBA1C 6.8 (A) 08/31/2019   HGBA1C 6.9 (A) 03/24/2019    HGBA1C 6.7 (A) 11/22/2018   HGBA1C 8.0 (A) 07/19/2018   HGBA1C 8.0 (A) 03/14/2018   HGBA1C 8.7 (H) 08/24/2017   HGBA1C 8.9 04/09/2017   HGBA1C 8.4 12/22/2016   HGBA1C 9.7 (H) 09/07/2016  Prev.  06/24/2022: HbA1c 7.6% 02/20/2016: HbA1c 7.2% 11/21/2015: HbA1c 7.4% 08/21/2015: HbA1c 7.7% 04/24/2015: HbA1c 7.8%  Pump settings: - basal rates: 12 am: 1.25 3 am: 1.15 6 am: 1.20 >> 1.15 11 am: 1.20 5 pm: 1.15 9 pm: 1.30 - ICR:   12 am: 9  5 am: 6  3 pm : 6 - target:  12 am: 110-120 - ISF:  12 am: 45  5:30 am: 45  9:30 pm: 45 - Insulin  on Board: 4h - bolus wizard: on  Calibrate the sensor 2-4 times a day. You may need a temporary basal rate of 80% for the night after exercise.  TDD from basal insulin : 64% >> 71% >> 68% >> 59% >> 63% TDD from bolus insulin : 36% >> 29% >> 32% >> 41% >> 37% TDD 38 to 70 units units. - extended bolusing: not using - changes infusion site: Every every 4 to 5 days >> every 6-7 days >> every 2-4 days.   She checks her sugars more than 4 times a day with her CGM:  Previously:   Previously:  Lowest sugar: 40s at the beach >>  50s >> 47. She has hypoglycemia awareness at 58s.  She had previous visits to the ED for hypoglycemia years ago, not recently. Highest sugar:  >400s >> 292 >> 300 (suspended the pump before surgery) >> >400 (site) >> 346. No previous DKA admissions.  No CKD, last BUN/creatinine:  Lab Results  Component Value Date   BUN 12 02/22/2023   BUN 9 12/15/2022   CREATININE 0.86 02/22/2023   CREATININE 0.9 12/15/2022   Lab Results  Component Value Date   MICRALBCREAT 8 02/22/2023   MICRALBCREAT 0.7 10/17/2021   MICRALBCREAT 0.7 01/27/2021   MICRALBCREAT 1.0 08/31/2019   MICRALBCREAT 0.9 07/19/2018   MICRALBCREAT 1.5 09/07/2016  Not on ACE inhibitor/ARB.  + HL; last set of lipids: Lab Results  Component Value Date   CHOL 199 02/22/2023   HDL 66 02/22/2023   LDLCALC 110 (H) 02/22/2023   TRIG 120 02/22/2023    CHOLHDL 3.0 02/22/2023  08/21/2015: 145/148/45/76 Not on a statin.  - latest eye exam: 12/16/2022: No DR reportedly.  - + numbness and tingling in her feet.  She has ingrown toenails.  Last foot exam 06/07/2023-Dr. McCaughan.  Hypothyroidism: -Uncontrolled  in the past due to incomplete compliance with levothyroxine  100  Pt is on levothyroxine  112 mcg daily, taken: - in am ~7 am - fasting - at least 30 min from b'fast - no calcium - + iron (2 pm) - no multivitamins - no PPIs - not on Biotin Now on B12 shots.  Reviewed her TFTs: Lab Results  Component Value Date   TSH 2.77 02/22/2023   TSH 2.99 02/17/2022   TSH 0.33 (L) 10/17/2021   TSH 25.32 (H) 05/29/2021   TSH 0.25 (L) 01/27/2021   TSH 0.12 (L) 09/20/2020   TSH 25.79 (H) 05/10/2020   TSH 4.88 (H) 01/10/2020   TSH 7.24 (H) 08/31/2019   TSH 0.27 (L) 03/24/2019  11/21/2015: TSH 6.397 08/21/2015: TSH 41.268 04/24/2015: TSH 14.788 08/16/2014: TSH 0.407  Pt denies: - feeling nodules in neck - hoarseness - dysphagia - choking  She started college in October 24 2016 >> May 2022. Sociology major.  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  Stopped [employer] shifts.  ROS: + see HPI   I reviewed pt's medications, allergies, PMH, social hx, family hx, and changes were documented in the history of present illness. Otherwise, unchanged from my initial visit note.  Past Medical History:  Diagnosis Date   Anemia 2018   Diabetes mellitus without complication (HCC)    Heart murmur    Thyroid  disease     Social History   Social History   Marital status: Single    Spouse name: N/A   Number of children: 0   Occupational History   student   Social History Main Topics   Smoking status: Never Smoker   Smokeless tobacco: Never Used   Alcohol use No   Drug use: No   Current Outpatient Medications on File Prior to Visit  Medication Sig Dispense Refill   ascorbic acid (VITAMIN C) 500 MG tablet Take by mouth.      Continuous Glucose Sensor (DEXCOM G7 SENSOR) MISC Use to check glucose continuously, change sensor every 10 days 9 each 3   cyanocobalamin  (VITAMIN B12) 1000 MCG/ML injection Patient to administer intramuscular 1 time weekly for 4 weeks, then 1 time monthly for 3 months. 1 mL 7   ferrous sulfate 325 (65 FE) MG EC tablet Take by mouth.     FLUCELVAX 0.5 ML  injection      Glucagon  3 MG/DOSE POWD PLACE 3 MG INTO THE NOSE ONCE AS NEEDED FOR UP TO 1 DOSE. 1 each 11   glucose blood (ACCU-CHEK GUIDE) test strip Use to check blood sugar 4 times a day. 400 each 12   Insulin  Disposable Pump (OMNIPOD 5 DEXG7G6 PODS GEN 5) MISC Use as directed every 3 (three) days. 30 each 3   insulin  lispro (HUMALOG ) 100 UNIT/ML injection Use up to 70 units via insulin  pump. 70 mL 4   levothyroxine  (SYNTHROID ) 112 MCG tablet Take 1 tablet (112 mcg total) by mouth daily. 90 tablet 3   meloxicam  (MOBIC ) 15 MG tablet Take 1 tablet (15 mg total) by mouth daily. 30 tablet 1   Norgestimate -Ethinyl Estradiol Triphasic (TRI-VYLIBRA ) 0.18/0.215/0.25 MG-35 MCG tablet Take 1 tablet by mouth daily. 84 tablet 0   SYRINGE-NEEDLE, DISP, 3 ML 25G X 1" 3 ML MISC Use to administer B-12 injections 1 time weekly for 4 weeks and then 1 time monthly for 3 months to complete series. 7 each 0   No current facility-administered medications on file prior to visit.   No Known Allergies Family History  Problem Relation Age of Onset   Allergies Father    Diabetes Father    PE: BP 114/76 (BP Location: Left Arm, Patient Position: Sitting, Cuff Size: Normal)   Pulse 76   Ht 5\' 2"  (1.575 m)   Wt 165 lb (74.8 kg)   SpO2 97%   BMI 30.18 kg/m  Wt Readings from Last 3 Encounters:  06/25/23 165 lb (74.8 kg)  04/29/23 161 lb (73 kg)  03/25/23 163 lb 9.6 oz (74.2 kg)   Constitutional: normal weight, in NAD Eyes: EOMI, no exophthalmos ENT: no thyromegaly, no cervical lymphadenopathy Cardiovascular: RRR, No RG, + SEM 1/6 Respiratory: CTA  B Musculoskeletal: no deformities Skin:  no rashes Neurological: no tremor with outstretched hands  1. DM1 + 2, uncontrolled, with complications - PN  2. Insulin  pump treatment   3. Hypothyroidism  PLAN:  1. And 2. Patient with longstanding, uncontrolled, type 1 diabetes, on insulin  pump therapy, with improving control after switching from Medtronic 770G to OmniPod 5 insulin  pump.  She uses the Dexcom CGM.  She is on the G6 CGM as the G7 was not covered for her.  At last visit sugars were fluctuating mainly within the target range with only slightly higher values after lunch and dinner.  She was not entering enough carbs into the home for meals and if she was entering carbs, she was doing so after the sugars are already elevated after the meals.  I advised her to try to enter all of the carbs into the pump before the meal.  I also recommended to enter 40-50% of the protein amount in grams as carbs into the pump for low-carb meals or if she has meals with large amounts of proteins.  HbA1c at last visit was slightly higher at 7.1%.  She was not exercising at that time due to being busy with her new job, but was planning to start after the holidays. CGM interpretation: -At today's visit, we reviewed her CGM downloads: It appears that 68% of values are in target range (goal >70%), while 31% are higher than 180 (goal <25%), and 1% are lower than 70 (goal <4%).  The calculated average blood sugar is 157.  The projected HbA1c for the next 3 months (GMI) is 7.1%. -Reviewing the CGM trends, sugars appear to be higher at night  lately, after she started to eat her dinner is late.  They are also slightly higher after lunch and dropping afterwards.  The sugars overnight remain elevated, while they were usually at goal 2 weeks ago, when she was eating dinner earlier.  We discussed about trying to move dinners earlier. - Reviewing the pump downloads, she is still not entering carbs and bolusing before meals.  She  is entering small amount of carbs when sugars already high after meals, which promotes blood sugar variability.  I strongly advised her again to try to bolus before the meal, and, since she is now on lispro insulin  (Lyumjev  was expensive), I advised her to bolus 15 minutes before each meal. -At today's visit, she is interested in a GLP-1 receptor agonist.  Her BMI is above 30 and we discussed that she would benefit from such medication from both the weight and the blood sugars point of view.  Unfortunately, these medicines are not covered for type 1 diabetes.  However, since she has obesity and insulin  resistance, she does have a component of "type 2 diabetes".  Because of this, I will change her diabetes code to "other specified diabetes with diabetic neuropathy".  We discussed about starting Ozempic  at a low dose and increasing as tolerated.  I am really hoping that her insurance will cover this for her. -I advised her to:  Patient Instructions  Please use the following pump settings: - basal rates: 12 am: 1.25 3 am: 1.15 6 am: 1.15 11 am: 1.20 5 pm: 1.15 9 pm: 1.30 - ICR:   12 am: 9  5 am: 6  3 pm : 6 - target:  12 am: 110-120 - ISF:  12 am: 45  5:30 am: 45  9:30 pm: 45 - Insulin  on Board: 4h - bolus wizard: on   Try to start the meal with fiber, protein and fat and end with carbs. Enter 40-50% protein grams for a low carb meal.  Try to move dinners earlier.  Please do the following 15 min before a meal: - Enter all carbs (C) - Enter sugars (S) - Start insulin  bolus (I)  Please start Ozempic  0.25 mg weekly in a.m. (for example on Sunday morning) x 4 weeks, then increase to 0.5 mg weekly in a.m. if no nausea or hypoglycemia.    Please continue Levothyroxine  112 mcg daily.  Take the thyroid  hormone every day, with water, at least 30 minutes before breakfast, separated by at least 4 hours from: - acid reflux medications - calcium - iron - multivitamins   Please return in  4 months.  - we checked her HbA1c: 6.3% (lower) -lower than expected from her CGM, most likely due to her anemia. - advised to check sugars at different times of the day - 4x a day, rotating check times - advised for yearly eye exams >> she is UTD - return to clinic in 4 months  3. Hypothyroidism - latest thyroid  labs reviewed with pt. >> normal: Lab Results  Component Value Date   TSH 2.77 02/22/2023  - she continues on LT4 112 mcg daily - pt feels good on this dose. - we discussed about taking the thyroid  hormone every day, with water, >30 minutes before breakfast, separated by >4 hours from acid reflux medications, calcium, iron, multivitamins. Pt. is taking it correctly.  Emilie Harden, MD PhD [facility] Endocrinology

## 2023-06-25 NOTE — Addendum Note (Signed)
 Addended by: Hall Levering on: 06/25/2023 11:11 AM   Modules accepted: Orders

## 2023-06-26 ENCOUNTER — Other Ambulatory Visit (HOSPITAL_COMMUNITY): Payer: Self-pay

## 2023-06-28 ENCOUNTER — Telehealth: Payer: Self-pay

## 2023-06-28 ENCOUNTER — Encounter (HOSPITAL_COMMUNITY): Payer: Self-pay

## 2023-06-28 NOTE — Telephone Encounter (Signed)
 Pharmacy Patient Advocate Encounter   Received notification from CoverMyMeds that prior authorization for Ozempic  is required/requested.   Insurance verification completed.   The patient is insured through [facility] .   Per test claim: PA required; PA submitted to above mentioned insurance via CoverMyMeds Key/confirmation #/EOC [id number] Status is pending

## 2023-06-29 ENCOUNTER — Other Ambulatory Visit (HOSPITAL_COMMUNITY): Payer: Self-pay

## 2023-06-30 ENCOUNTER — Other Ambulatory Visit (HOSPITAL_COMMUNITY): Payer: Self-pay

## 2023-06-30 ENCOUNTER — Telehealth: Payer: Self-pay | Admitting: Physician Assistant

## 2023-06-30 NOTE — Telephone Encounter (Signed)
 The patient called and left a voicemail requesting to speak with the nurse regarding her results. I forwarded the message to the nurse for follow-up.

## 2023-06-30 NOTE — Telephone Encounter (Signed)
 Left detailed message of Capsule study results on VM- no abnormalities seen. No blood seen.Per Dr.Anna.

## 2023-07-03 ENCOUNTER — Other Ambulatory Visit (HOSPITAL_COMMUNITY): Payer: Self-pay

## 2023-07-05 ENCOUNTER — Other Ambulatory Visit: Payer: Self-pay

## 2023-07-05 ENCOUNTER — Other Ambulatory Visit (HOSPITAL_COMMUNITY): Payer: Self-pay

## 2023-07-05 ENCOUNTER — Other Ambulatory Visit: Payer: Self-pay | Admitting: Physician Assistant

## 2023-07-05 ENCOUNTER — Encounter (HOSPITAL_COMMUNITY): Payer: Self-pay

## 2023-07-05 DIAGNOSIS — Z3041 Encounter for surveillance of contraceptive pills: Secondary | ICD-10-CM

## 2023-07-06 NOTE — Telephone Encounter (Signed)
 Pharmacy Patient Advocate Encounter  Received notification from [employer] that Prior Authorization for Ozempic  has been APPROVED from 06/28/23 to 06/27/24

## 2023-07-12 ENCOUNTER — Ambulatory Visit: Admitting: Podiatry

## 2023-07-12 ENCOUNTER — Other Ambulatory Visit (HOSPITAL_COMMUNITY): Payer: Self-pay

## 2023-07-13 ENCOUNTER — Ambulatory Visit: Admitting: Podiatry

## 2023-07-13 ENCOUNTER — Encounter: Payer: Self-pay | Admitting: Podiatry

## 2023-07-13 DIAGNOSIS — S90222A Contusion of left lesser toe(s) with damage to nail, initial encounter: Secondary | ICD-10-CM

## 2023-07-13 DIAGNOSIS — E109 Type 1 diabetes mellitus without complications: Secondary | ICD-10-CM

## 2023-07-13 DIAGNOSIS — L603 Nail dystrophy: Secondary | ICD-10-CM | POA: Diagnosis not present

## 2023-07-13 NOTE — Progress Notes (Unsigned)
   HPI: 23 y.o. female presents today noting an old injury to the left third toenail approximately 1 year ago.  States that it has been thick and discolored ever since then.  She is concerned that she may have nail fungus.  She noted that she likes to wear nail polish on her toenails.  She is a type I diabetic.  Past Medical History:  Diagnosis Date   Anemia 2018   Diabetes mellitus without complication (HCC)    Heart murmur    Thyroid  disease    Past Surgical History:  Procedure Laterality Date   COLONOSCOPY WITH PROPOFOL  N/A 04/29/2023   Procedure: COLONOSCOPY WITH PROPOFOL ;  Surgeon: Luke Salaam, MD;  Location: [facility] ENDOSCOPY;  Service: Gastroenterology;  Laterality: N/A;   ESOPHAGOGASTRODUODENOSCOPY (EGD) WITH PROPOFOL  N/A 04/29/2023   Procedure: ESOPHAGOGASTRODUODENOSCOPY (EGD) WITH PROPOFOL ;  Surgeon: Luke Salaam, MD;  Location: [facility] ENDOSCOPY;  Service: Gastroenterology;  Laterality: N/A;   GIVENS CAPSULE STUDY N/A 06/21/2023   Procedure: IMAGING PROCEDURE, GI TRACT, INTRALUMINAL, VIA CAPSULE;  Surgeon: Luke Salaam, MD;  Location: [facility] ENDOSCOPY;  Service: Gastroenterology;  Laterality: N/A;   No Known Allergies    Physical Exam: Palpable pedal pulses.  Interspaces are clear.  Left third toenail is 3 mm thick, brittle, discolored white and yellow, with pain on compression and subungual debris.  Epicritic sensation was intact.  No hammertoe deformity appreciated  Assessment/Plan of Care: 1. Contusion of toenail of left foot, initial encounter   2. Nail dystrophy   3. Type 1 diabetes mellitus without complication (HCC)    Clippings of the left third toenail will be obtained and sent to [facility] labs for fungal nail culture.  Informed the patient that the nail may be dystrophic from the injury, but this may have also allowed a fungus to get up underneath the nail.  She would like to treat the fungus if possible.  She does like to wear nail polish.  Her last liver enzymes were within  normal limits that were drawn on 02/22/2023.   Joe Murders, DPM, FACFAS [facility]     [address] [place], [place] [address]                Office [phone]  Fax [phone]

## 2023-07-21 ENCOUNTER — Other Ambulatory Visit: Payer: Self-pay | Admitting: Physician Assistant

## 2023-07-21 DIAGNOSIS — Z3041 Encounter for surveillance of contraceptive pills: Secondary | ICD-10-CM

## 2023-07-23 ENCOUNTER — Other Ambulatory Visit: Payer: Self-pay | Admitting: Physician Assistant

## 2023-07-23 ENCOUNTER — Other Ambulatory Visit (HOSPITAL_COMMUNITY): Payer: Self-pay

## 2023-07-23 DIAGNOSIS — Z3041 Encounter for surveillance of contraceptive pills: Secondary | ICD-10-CM

## 2023-07-28 ENCOUNTER — Other Ambulatory Visit (HOSPITAL_COMMUNITY): Payer: Self-pay

## 2023-07-28 ENCOUNTER — Encounter (HOSPITAL_COMMUNITY): Payer: Self-pay

## 2023-07-28 ENCOUNTER — Other Ambulatory Visit: Payer: Self-pay | Admitting: Physician Assistant

## 2023-07-28 ENCOUNTER — Other Ambulatory Visit: Payer: Self-pay

## 2023-07-28 DIAGNOSIS — Z3041 Encounter for surveillance of contraceptive pills: Secondary | ICD-10-CM

## 2023-07-29 ENCOUNTER — Encounter: Payer: Self-pay | Admitting: Physician Assistant

## 2023-07-29 ENCOUNTER — Ambulatory Visit (INDEPENDENT_AMBULATORY_CARE_PROVIDER_SITE_OTHER): Admitting: Physician Assistant

## 2023-07-29 ENCOUNTER — Other Ambulatory Visit (HOSPITAL_COMMUNITY): Payer: Self-pay

## 2023-07-29 VITALS — BP 120/78 | HR 82 | Temp 97.7°F | Ht 62.6 in | Wt 158.0 lb

## 2023-07-29 DIAGNOSIS — Z0000 Encounter for general adult medical examination without abnormal findings: Secondary | ICD-10-CM | POA: Diagnosis not present

## 2023-07-29 DIAGNOSIS — Z3041 Encounter for surveillance of contraceptive pills: Secondary | ICD-10-CM | POA: Diagnosis not present

## 2023-07-29 DIAGNOSIS — E538 Deficiency of other specified B group vitamins: Secondary | ICD-10-CM | POA: Diagnosis not present

## 2023-07-29 MED ORDER — NORGESTIM-ETH ESTRAD TRIPHASIC 0.18/0.215/0.25 MG-35 MCG PO TABS
1.0000 | ORAL_TABLET | Freq: Every day | ORAL | 3 refills | Status: AC
  Filled 2023-07-29: qty 84, 84d supply, fill #0
  Filled 2023-10-16: qty 84, 84d supply, fill #1
  Filled 2024-01-05: qty 84, 84d supply, fill #2
  Filled 2024-03-28: qty 84, 84d supply, fill #3

## 2023-07-29 NOTE — Progress Notes (Signed)
 Patient ID: Melissa Reese, female    DOB: 2000-08-22, 23 y.o.   MRN: [id number]   Assessment & Plan:   Annual physical exam  B12 deficiency  Encounter for surveillance of contraceptive pills -     Norgestim-Eth Estrad Triphasic; Take 1 tablet by mouth daily.  Dispense: 84 tablet; Refill: 3     Type 1 Diabetes Mellitus Type 1 diabetes mellitus is well-managed with an HbA1c of 6.3. She experiences bloating and gastrointestinal issues potentially related to insulin  use. Recent endoscopy and colonoscopy were normal, excluding gastrointestinal pathology. Vitamin B12 levels have improved with injections, and a transition to oral supplementation is planned. - Continue follow-up with endocrinologist. - Switch from B12 injections to oral B complex vitamin after completing current injection course. - Consider pneumococcal vaccination due to increased risk associated with diabetes.  General Health Maintenance Pap smear is current and normal, next due in 2027. She maintains regular exercise and abstains from smoking and alcohol. Vision and dental check-ups are up to date. - Continue regular exercise regimen. - Maintain routine vision and dental check-ups. - Pap smear next due in 2027.  Patient Counseling: [x]   Nutrition: Stressed importance of moderation in sodium/caffeine intake, saturated fat and cholesterol, caloric balance, sufficient intake of fresh fruits, vegetables, and fiber.  [x]   Stressed the importance of regular exercise.   [x]   Substance Abuse: Discussed cessation/primary prevention of tobacco, alcohol, or other drug use; driving or other dangerous activities under the influence; availability of treatment for abuse.   []   Injury prevention: Discussed safety belts, safety helmets, smoke detector, smoking near bedding or upholstery.   []   Sexuality: Discussed sexually transmitted diseases, partner selection, use of condoms, avoidance of unintended pregnancy  and contraceptive  alternatives.   [x]   Dental health: Discussed importance of regular tooth brushing, flossing, and dental visits.  [x]   Health maintenance and immunizations reviewed. Please refer to Health maintenance section.           Return in about 1 year (around 07/28/2024) for physical, recheck .    Subjective:    Chief Complaint  Patient presents with   Annual Exam    Pt in office for CPE and labs; pt is fasting for labs; no changes in the last year; birth control refill;     HPI Discussed the use of AI scribe software for clinical note transcription with the patient, who gave verbal consent to proceed.  History of Present Illness Melissa Reese is a 23 year old female with type 1 diabetes who presents for an annual physical exam.  She manages her type 1 diabetes with insulin  therapy and recently achieved an A1c of 6.3, an improvement from previous results. She experiences chronic bloating and stomach issues, which she suspects may be related to insulin  use. Despite normal results from endoscopy, colonoscopy, and capsule endoscopy, she continues to monitor her diet and increase water intake to alleviate symptoms.  She is currently taking vitamin B12 injections, initially once a week for four weeks, then once a month. She missed a dose due to her endoscopy and colonoscopy but plans to switch to an oral B complex vitamin after her current supply is finished.  She is on birth control and reports regular menstrual cycles, with her period occurring once a month. She plans to start a new pack of birth control pills on Sunday.  She engages in regular physical activity, using a walking pad at work due to inclement weather, and enjoys walking outside  when possible. She lives with her mother and is in a long-distance relationship with her boyfriend, who is planning to move closer. She is considering apartments in the [place] and [place] areas.     Past Medical History:  Diagnosis Date    Anemia 2018   Diabetes mellitus without complication (HCC)    Heart murmur    Thyroid  disease     Past Surgical History:  Procedure Laterality Date   COLONOSCOPY WITH PROPOFOL  N/A 04/29/2023   Procedure: COLONOSCOPY WITH PROPOFOL ;  Surgeon: Luke Salaam, MD;  Location: [facility] ENDOSCOPY;  Service: Gastroenterology;  Laterality: N/A;   ESOPHAGOGASTRODUODENOSCOPY (EGD) WITH PROPOFOL  N/A 04/29/2023   Procedure: ESOPHAGOGASTRODUODENOSCOPY (EGD) WITH PROPOFOL ;  Surgeon: Luke Salaam, MD;  Location: [facility] ENDOSCOPY;  Service: Gastroenterology;  Laterality: N/A;   GIVENS CAPSULE STUDY N/A 06/21/2023   Procedure: IMAGING PROCEDURE, GI TRACT, INTRALUMINAL, VIA CAPSULE;  Surgeon: Luke Salaam, MD;  Location: [facility] ENDOSCOPY;  Service: Gastroenterology;  Laterality: N/A;    Family History  Problem Relation Age of Onset   Allergies Father    Diabetes Father     Social History   Tobacco Use   Smoking status: Never   Smokeless tobacco: Never  Substance Use Topics   Alcohol use: No   Drug use: No     No Known Allergies  Review of Systems NEGATIVE UNLESS OTHERWISE INDICATED IN HPI      Objective:     BP 120/78 (BP Location: Right Arm, Patient Position: Sitting, Cuff Size: Normal)   Pulse 82   Temp 97.7 F (36.5 C) (Temporal)   Ht 5' 2.6" (1.59 m)   Wt 158 lb (71.7 kg)   LMP 07/25/2023 (Exact Date)   SpO2 98%   BMI 28.35 kg/m   Wt Readings from Last 3 Encounters:  07/29/23 158 lb (71.7 kg)  06/25/23 165 lb (74.8 kg)  04/29/23 161 lb (73 kg)    BP Readings from Last 3 Encounters:  07/29/23 120/78  06/25/23 114/76  04/29/23 109/71     Physical Exam Vitals and nursing note reviewed.  Constitutional:      Appearance: Normal appearance. She is normal weight. She is not toxic-appearing.  HENT:     Head: Normocephalic and atraumatic.     Right Ear: Tympanic membrane, ear canal and external ear normal.     Left Ear: Tympanic membrane, ear canal and external ear normal.     Nose:  Nose normal.     Mouth/Throat:     Mouth: Mucous membranes are moist.  Eyes:     Extraocular Movements: Extraocular movements intact.     Conjunctiva/sclera: Conjunctivae normal.     Pupils: Pupils are equal, round, and reactive to light.  Cardiovascular:     Rate and Rhythm: Normal rate and regular rhythm.     Pulses: Normal pulses.     Heart sounds: Normal heart sounds.  Pulmonary:     Effort: Pulmonary effort is normal.     Breath sounds: Normal breath sounds.  Abdominal:     General: Abdomen is flat. Bowel sounds are normal.     Palpations: Abdomen is soft.  Musculoskeletal:        General: Normal range of motion.     Cervical back: Normal range of motion and neck supple.  Skin:    General: Skin is warm and dry.  Neurological:     General: No focal deficit present.     Mental Status: She is alert and oriented to person, place, and time.  Psychiatric:        Mood and Affect: Mood normal.        Behavior: Behavior normal.        Thought Content: Thought content normal.        Judgment: Judgment normal.             Reni Hausner M Gracilyn Gunia, PA-C

## 2023-08-06 ENCOUNTER — Other Ambulatory Visit: Payer: Self-pay | Admitting: Podiatry

## 2023-08-15 ENCOUNTER — Ambulatory Visit: Payer: Self-pay | Admitting: Podiatry

## 2023-08-24 ENCOUNTER — Other Ambulatory Visit (HOSPITAL_COMMUNITY): Payer: Self-pay

## 2023-08-24 ENCOUNTER — Other Ambulatory Visit: Payer: Self-pay

## 2023-09-20 ENCOUNTER — Other Ambulatory Visit (HOSPITAL_COMMUNITY): Payer: Self-pay

## 2023-09-27 ENCOUNTER — Other Ambulatory Visit: Payer: Self-pay

## 2023-09-28 ENCOUNTER — Other Ambulatory Visit (HOSPITAL_COMMUNITY): Payer: Self-pay

## 2023-09-28 ENCOUNTER — Encounter (HOSPITAL_COMMUNITY): Payer: Self-pay

## 2023-09-29 ENCOUNTER — Other Ambulatory Visit (HOSPITAL_COMMUNITY): Payer: Self-pay

## 2023-10-16 ENCOUNTER — Other Ambulatory Visit (HOSPITAL_COMMUNITY): Payer: Self-pay

## 2023-10-17 ENCOUNTER — Encounter: Payer: Self-pay | Admitting: Internal Medicine

## 2023-10-17 DIAGNOSIS — E1065 Type 1 diabetes mellitus with hyperglycemia: Secondary | ICD-10-CM

## 2023-10-17 DIAGNOSIS — E039 Hypothyroidism, unspecified: Secondary | ICD-10-CM

## 2023-10-21 ENCOUNTER — Telehealth: Payer: Self-pay

## 2023-10-21 DIAGNOSIS — E039 Hypothyroidism, unspecified: Secondary | ICD-10-CM

## 2023-10-21 DIAGNOSIS — E1042 Type 1 diabetes mellitus with diabetic polyneuropathy: Secondary | ICD-10-CM

## 2023-10-21 DIAGNOSIS — D508 Other iron deficiency anemias: Secondary | ICD-10-CM

## 2023-10-22 LAB — MICROALBUMIN / CREATININE URINE RATIO: Microalb Creat Ratio: 10

## 2023-10-22 NOTE — Telephone Encounter (Signed)
 Here's the list of labs I need:   * Lipid Panel * TSH * Free T4 * Hemoglobin A1C * Microalbumin/Creatinine Urine Ratio * Complete Metabolic Panel * Vitamin B12 * CBC with Diff and Platelets * Iron, TIBC, and Ferritin Panel   All orders have been faxed.

## 2023-10-24 LAB — LAB REPORT - SCANNED
A1c: 6.5
EGFR: 80
Free T4: 0.92 ng/dL
TSH: 59.8 — AB (ref 0.41–5.90)

## 2023-10-25 ENCOUNTER — Encounter: Payer: Self-pay | Admitting: Internal Medicine

## 2023-10-25 ENCOUNTER — Other Ambulatory Visit (HOSPITAL_COMMUNITY): Payer: Self-pay

## 2023-10-25 ENCOUNTER — Encounter: Admitting: Physician Assistant

## 2023-10-25 ENCOUNTER — Other Ambulatory Visit: Payer: Self-pay | Admitting: Physician Assistant

## 2023-10-25 ENCOUNTER — Ambulatory Visit: Admitting: Internal Medicine

## 2023-10-25 VITALS — BP 120/70 | HR 71 | Ht 62.6 in | Wt 147.6 lb

## 2023-10-25 DIAGNOSIS — E538 Deficiency of other specified B group vitamins: Secondary | ICD-10-CM

## 2023-10-25 DIAGNOSIS — E039 Hypothyroidism, unspecified: Secondary | ICD-10-CM

## 2023-10-25 DIAGNOSIS — E1042 Type 1 diabetes mellitus with diabetic polyneuropathy: Secondary | ICD-10-CM | POA: Diagnosis not present

## 2023-10-25 DIAGNOSIS — E1065 Type 1 diabetes mellitus with hyperglycemia: Secondary | ICD-10-CM

## 2023-10-25 DIAGNOSIS — Z9641 Presence of insulin pump (external) (internal): Secondary | ICD-10-CM

## 2023-10-25 NOTE — Progress Notes (Addendum)
 Patient ID: Melissa Reese, female   DOB: 08-Apr-2000, 23 y.o.   MRN: [id number]   HPI: Melissa Reese is a 23 y.o.-year-old very pleasant female, initially referred by her PCP, Dr. 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 Prev. seen at [facility]. Last visit with me 4 months ago.  Interim history: No increased urination, blurry vision, nausea, chest pain.   She had EGD, colonoscopy and also capsule endoscopy to investigate for anemia.  Currently off the iron and also has not been taking B12 injections for 6 months. She was at the beach for 3 weeks - erratic levothyroxine  dosing.  Insulin  pump: - Started at 23 years old - OneTouch ping - started 01/2015 - Medtronic 670 G insulin  pump + guardian CGM-started 09/2016 (band aid to keep the CGM in place: Simplatch). She had a crack in her pump -received another pump 01/2018.   - Medtronic 770 G started 01/2019 - renewed 03/2021.   - now OmniPod 5 pump - started 10/2022 (3 mo 326$)  Meter:  -Accu-Chek guide link - $$ under new insurance  CGM: -Medtronic Guardian -now Dexcom G7   Insulin : -FiAsp  (PA approved) >> 198$ for 3 mo -tried Lyumjev  but $ - now Lispro  DM1:  Reviewed HbA1c levels: 10/22/2023: HbA1c 6.5% Lab Results  Component Value Date   HGBA1C 6.3 (A) 06/25/2023   HGBA1C 7.1 (A) 02/22/2023   HGBA1C 6.9 12/15/2022   HGBA1C 6.5 (A) 10/20/2022   HGBA1C 7.6 (A) 02/17/2022   HGBA1C 7.3 (A) 10/17/2021   HGBA1C 7.5 (A) 05/29/2021   HGBA1C 7.5 (A) 01/27/2021   HGBA1C 7.4 (A) 09/20/2020   HGBA1C 7.5 (A) 05/10/2020   HGBA1C 7.2 (A) 01/10/2020   HGBA1C 6.8 (A) 08/31/2019   HGBA1C 6.9 (A) 03/24/2019   HGBA1C 6.7 (A) 11/22/2018   HGBA1C 8.0 (A) 07/19/2018   HGBA1C 8.0 (A) 03/14/2018   HGBA1C 8.7 (H) 08/24/2017   HGBA1C 8.9 04/09/2017   HGBA1C 8.4 12/22/2016   HGBA1C 9.7 (H) 09/07/2016  Prev.  06/24/2022: HbA1c  7.6% 02/20/2016: HbA1c 7.2% 11/21/2015: HbA1c 7.4% 08/21/2015: HbA1c 7.7% 04/24/2015: HbA1c 7.8%  She is on: - Ozempic  0.25 >> 0.5 mg weekly - started 06/2023  Pump settings: - basal rates: 12 am: 1.25 3 am: 1.15 6 am: 1.20 >> 1.15 11 am: 1.20 5 pm: 1.15 9 pm: 1.30 - ICR:   12 am: 9  5 am: 6  3 pm : 6 - target:  12 am: 110-120 - ISF:  12 am: 45  5:30 am: 45  9:30 pm: 45 - Insulin  on Board: 4h - bolus wizard: on  Calibrate the sensor 2-4 times a day. You may need a temporary basal rate of 80% for the night after exercise.  TDD from basal insulin : 64% >> 71% >> 68% >> 59% >> 63% >> 74% (16 units) TDD from bolus insulin : 36% >> 29% >> 32% >> 41% >> 37% >> 26% (60 minutes) TDD 38 to 70 >> 22-50 units - extended bolusing: not using - changes infusion site: Every every 4 to 5 days >> every 6-7 days >> every 2-4 days.   She checks her sugars more than 4 times a day with her CGM:      Previously:  Previously:   Lowest sugar: 40s at the beach >> 50s >> 47. She has hypoglycemia awareness at 35s.  She had previous visits to the ED  for hypoglycemia years ago, not recently. Highest sugar:  >400s >> 292 >> 300 (suspended the pump before surgery) >> >400 (site) >> 346. No previous DKA admissions.  No CKD, last BUN/creatinine:  10/22/2023: 8/1.0, GFR 80, glucose 174 Lab Results  Component Value Date   BUN 12 02/22/2023   BUN 9 12/15/2022   CREATININE 0.86 02/22/2023   CREATININE 0.9 12/15/2022   10/22/2023: ACR 10 Lab Results  Component Value Date   MICRALBCREAT 8 02/22/2023  Not on ACE inhibitor/ARB.  + HL; last set of lipids: 10/22/2023: 226/135/51/151 Lab Results  Component Value Date   CHOL 199 02/22/2023   HDL 66 02/22/2023   LDLCALC 110 (H) 02/22/2023   TRIG 120 02/22/2023   CHOLHDL 3.0 02/22/2023  Not on a statin.  - latest eye exam: 12/16/2022: No DR reportedly.  - + numbness and tingling in her feet.  She has ingrown toenails.  Last foot exam  06/07/2023-Dr. McCaughan.  Hypothyroidism: -Uncontrolled  in the past due to incomplete compliance with levothyroxine  100  Pt is on levothyroxine  112 mcg daily, taken: - in am ~7 am - fasting - at least 30 min from b'fast - no calcium - + iron (2 pm) - no multivitamins - no PPIs - not on Biotin Prev. on B12 shots >> missed doses since 04/2023.  Reviewed her TFTs: 10/22/2023: TSH 59.8 Lab Results  Component Value Date   TSH 2.77 02/22/2023   TSH 2.99 02/17/2022   TSH 0.33 (L) 10/17/2021   TSH 25.32 (H) 05/29/2021   TSH 0.25 (L) 01/27/2021   TSH 0.12 (L) 09/20/2020   TSH 25.79 (H) 05/10/2020   TSH 4.88 (H) 01/10/2020   TSH 7.24 (H) 08/31/2019   TSH 0.27 (L) 03/24/2019  11/21/2015: TSH 6.397 08/21/2015: TSH 41.268 04/24/2015: TSH 14.788 08/16/2014: TSH 0.407  Pt denies: - feeling nodules in neck - hoarseness - dysphagia - choking  10/22/2023:  Vitamin B12 218 ([phone]) Ferritin 4  She started college in August 2018 >> May 2022. Sociology major; + Masters.   ROS: + see HPI   I reviewed pt's medications, allergies, PMH, social hx, family hx, and changes were documented in the history of present illness. Otherwise, unchanged from my initial visit note.  Past Medical History:  Diagnosis Date   Anemia 2018   Diabetes mellitus without complication (HCC)    Heart murmur    Thyroid  disease     Social History   Social History   Marital status: Single    Spouse name: N/A   Number of children: 0   Occupational History   student   Social History Main Topics   Smoking status: Never Smoker   Smokeless tobacco: Never Used   Alcohol use No   Drug use: No   Current Outpatient Medications on File Prior to Visit  Medication Sig Dispense Refill   ascorbic acid (VITAMIN C) 500 MG tablet Take by mouth.     Continuous Glucose Sensor (DEXCOM G7 SENSOR) MISC Use to check glucose continuously, change sensor every 10 days 9 each 3   cyanocobalamin  (VITAMIN B12) 1000  MCG/ML injection Patient to administer intramuscular 1 time weekly for 4 weeks, then 1 time monthly for 3 months. 1 mL 7   ferrous sulfate 325 (65 FE) MG EC tablet Take by mouth.     FLUCELVAX 0.5 ML injection  (Patient not taking: Reported on 07/29/2023)     Glucagon  3 MG/DOSE POWD PLACE 3 MG INTO THE NOSE ONCE AS NEEDED FOR UP  TO 1 DOSE. 1 each 11   glucose blood (ACCU-CHEK GUIDE) test strip Use to check blood sugar 4 times a day. 400 each 12   Insulin  Disposable Pump (OMNIPOD 5 DEXG7G6 PODS GEN 5) MISC Use as directed every 3 (three) days. 30 each 3   insulin  lispro (HUMALOG ) 100 UNIT/ML injection Use up to 70 units via insulin  pump. 70 mL 4   levothyroxine  (SYNTHROID ) 112 MCG tablet Take 1 tablet (112 mcg total) by mouth daily. 90 tablet 3   meloxicam  (MOBIC ) 15 MG tablet Take 1 tablet (15 mg total) by mouth daily. 30 tablet 1   Norgestimate -Ethinyl Estradiol Triphasic (TRI-VYLIBRA ) 0.18/0.215/0.25 MG-35 MCG tablet Take 1 tablet by mouth daily. 84 tablet 3   Semaglutide ,0.25 or 0.5MG /DOS, 2 MG/3ML SOPN Inject 0.5 mg into the skin once a week. 9 mL 3   SYRINGE-NEEDLE, DISP, 3 ML 25G X 1 3 ML MISC Use to administer B-12 injections 1 time weekly for 4 weeks and then 1 time monthly for 3 months to complete series. 7 each 0   No current facility-administered medications on file prior to visit.   No Known Allergies Family History  Problem Relation Age of Onset   Allergies Father    Diabetes Father    PE: BP 120/70   Pulse 71   Ht 5' 2.6 (1.59 m)   Wt 147 lb 9.6 oz (67 kg)   BMI 26.48 kg/m  Wt Readings from Last 3 Encounters:  10/25/23 147 lb 9.6 oz (67 kg)  07/29/23 158 lb (71.7 kg)  06/25/23 165 lb (74.8 kg)   Constitutional: normal weight, in NAD Eyes: EOMI, no exophthalmos ENT: no thyromegaly, no cervical lymphadenopathy Cardiovascular: RRR, No RG, + SEM 1/6 Respiratory: CTA B Musculoskeletal: no deformities Skin:  no rashes Neurological: no tremor with outstretched  hands  1. DM1 + 2, uncontrolled, with complications - PN  2. Insulin  pump treatment   3. Hypothyroidism  PLAN:  1. And 2.  Patient with longstanding, uncontrolled, type 1 diabetes, with improvement in control after switching from the Medtronic 770 G insulin  pump to the OmniPod 5 pump.  She uses the Dexcom G7 CGM. - At last visit, sugars appeared to be higher at night after she started to eat her dinner late.  We discussed about moving these earlier.  They were also slightly higher after lunch and dropping afterwards.  Reviewing the pump downloads, she was still not entering carbs and bolusing before meals.  She was entering small amounts of carbs when sugars already high after meals which was promoting blood sugar variability.  I strongly advised her to bolus 15 minutes before meals as she was on lispro insulin  as Lyumjev  was too expensive.  She was interested in a GLP-1 receptor agonist and, since her BMI was above 30, and had insulin  resistance (a component of type 2 diabetes >> changed her diabetes code to other specified diabetes with diabetic neuropathy), we discussed that she would benefit from such medication for both weight loss and improving diabetes.  I called this in for her.  I advised her to start at a low dose and increase as tolerated. CGM interpretation: -At today's visit, we reviewed her CGM downloads: It appears that 73% of values are in target range (goal >70%), while 26% are higher than 180 (goal <25%), and 1% are lower than 70 (goal <4%).  The calculated average blood sugar is 156.  The projected HbA1c for the next 3 months (GMI) is 7.0%. -Reviewing the  CGM trends, sugars appear to mostly fluctuating in the target range but she has higher blood sugars after meals.  Upon reviewing individual daily tracings, she is not bolusing consistently before meals, only occasionally after the sugars are really high after meals and in that case, she introduces small amounts of carbs (see HPI).   Fortunately, she has been able to start on Ozempic  and she tolerates well the 0.5 mg weekly dose.  She lost 17 pounds since last visit, which is excellent! - At today's visit, I did not recommend a change in settings for now, but she definitely needs to start bolusing consistently before meals and in that case, we may need to relax her ICR and ISF and possibly also decrease her basal rates as I expect her sugars to improve due to her weight loss. -I advised her to:  Patient Instructions  Please use the following pump settings: - basal rates: 12 am: 1.25 3 am: 1.15 6 am: 1.15 11 am: 1.20 5 pm: 1.15 9 pm: 1.30 - ICR:   12 am: 9  5 am: 6  3 pm : 6 - target:  12 am: 110-120 - ISF:  12 am: 45  5:30 am: 45  9:30 pm: 45 - Insulin  on Board: 4h - bolus wizard: on   Try to start the meal with fiber, protein and fat and end with carbs. Enter 40-50% protein grams for a low carb meal.  Please do the following 15 min before a meal: - Enter all carbs (C) - Enter sugars (S) - Start insulin  bolus (I)  Please continue: - Ozempic  0.5 mg weekly     Please increase Levothyroxine  125 mcg daily.  Take the thyroid  hormone every day, with water, at least 30 minutes before breakfast, separated by at least 4 hours from: - acid reflux medications - calcium - iron - multivitamins    Please come back for labs in 1.5 months.  Please return in 4 months.  - advised to check sugars at different times of the day - 4x a day, rotating check times - advised for yearly eye exams >> she is UTD - her B12 and ferritin were both very low.  I advised her to restart her iron and her B12 injections through PCP - return to clinic in 4 months  3. Hypothyroidism - latest thyroid  labs reviewed with pt. >> TSH was very elevated, at 59.8!  She reports missing a lot of doses when she was at the beach.  She was trying to make about taking to the next day, but this was not enough. - she continues on LT4 112 mcg  daily - she reports brain fog.  I am surprised that she was able to lose 17 pounds, on Ozempic , due to this degree of hypothyroidism... - we discussed about taking the thyroid  hormone every day, with water, >30 minutes before breakfast, separated by >4 hours from acid reflux medications, calcium, iron, multivitamins.  She missed doses but she is now planning to take it consistently. - she denies the possibility of a pregnancy - I also recommended to increase the LT4 dose to 125 mcg daily - Plan to repeat labs in 1.5 months  Lela Fendt, MD PhD [facility] Endocrinology

## 2023-10-25 NOTE — Patient Instructions (Addendum)
 Please use the following pump settings: - basal rates: 12 am: 1.25 3 am: 1.15 6 am: 1.15 11 am: 1.20 5 pm: 1.15 9 pm: 1.30 - ICR:   12 am: 9  5 am: 6  3 pm : 6 - target:  12 am: 110-120 - ISF:  12 am: 45  5:30 am: 45  9:30 pm: 45 - Insulin  on Board: 4h - bolus wizard: on   Try to start the meal with fiber, protein and fat and end with carbs. Enter 40-50% protein grams for a low carb meal.  Please do the following 15 min before a meal: - Enter all carbs (C) - Enter sugars (S) - Start insulin  bolus (I)  Please continue: - Ozempic  0.5 mg weekly     Please increase Levothyroxine  125 mcg daily.  Take the thyroid  hormone every day, with water, at least 30 minutes before breakfast, separated by at least 4 hours from: - acid reflux medications - calcium - iron - multivitamins    Please come back for labs in 1.5 months.  Please return in 4 months.

## 2023-10-25 NOTE — Telephone Encounter (Signed)
 Please advise if your recommendations is for patient to continue injections of B-12

## 2023-10-25 NOTE — Telephone Encounter (Unsigned)
 Copied from CRM [id number]. Topic: Clinical - Medication Refill >> Oct 25, 2023 11:56 AM Dedra B wrote: Medication: cyanocobalamin  (VITAMIN B12) 1000 MCG/ML injection. Pt said she recently had blood work and her B12 was low. She wants to start the injections again and attempted to request refill in MyChart but sent it to [facility]. Should be [facility].   Has the patient contacted their pharmacy?  No, contacted the office since she had no refills  This is the patient's preferred pharmacy:  [facility] [address] [place] [address] Phone: [phone] Fax: [phone]   Is this the correct pharmacy for this prescription? Yes   Has the prescription been filled recently? No  Is the patient out of the medication? Yes  Has the patient been seen for an appointment in the last year OR does the patient have an upcoming appointment? Yes  Can we respond through MyChart? Yes  Agent: Please be advised that Rx refills may take up to 3 business days. We ask that you follow-up with your pharmacy.

## 2023-10-26 ENCOUNTER — Other Ambulatory Visit (HOSPITAL_COMMUNITY): Payer: Self-pay

## 2023-10-26 ENCOUNTER — Other Ambulatory Visit: Payer: Self-pay

## 2023-10-26 MED ORDER — CYANOCOBALAMIN 1000 MCG/ML IJ SOLN
INTRAMUSCULAR | 7 refills | Status: DC
  Filled 2023-10-26: qty 4, 28d supply, fill #0
  Filled 2023-11-21 – 2023-11-22 (×2): qty 3, 84d supply, fill #1
  Filled 2023-12-13 – 2024-01-05 (×3): qty 4, 28d supply, fill #1

## 2023-10-26 MED ORDER — LEVOTHYROXINE SODIUM 125 MCG PO TABS
125.0000 ug | ORAL_TABLET | Freq: Every day | ORAL | 3 refills | Status: AC
  Filled 2023-10-26: qty 90, 90d supply, fill #0
  Filled 2024-01-14: qty 90, 90d supply, fill #1

## 2023-10-26 NOTE — Addendum Note (Signed)
 Addended by: CLEOTILDE ROLIN RAMAN on: 10/26/2023 10:57 AM   Modules accepted: Orders

## 2023-10-27 ENCOUNTER — Other Ambulatory Visit (HOSPITAL_COMMUNITY): Payer: Self-pay

## 2023-11-12 ENCOUNTER — Other Ambulatory Visit (HOSPITAL_COMMUNITY): Payer: Self-pay

## 2023-11-17 ENCOUNTER — Other Ambulatory Visit: Payer: Self-pay

## 2023-11-17 ENCOUNTER — Other Ambulatory Visit (HOSPITAL_COMMUNITY): Payer: Self-pay

## 2023-11-21 ENCOUNTER — Other Ambulatory Visit (HOSPITAL_COMMUNITY): Payer: Self-pay

## 2023-11-22 ENCOUNTER — Other Ambulatory Visit (HOSPITAL_COMMUNITY): Payer: Self-pay

## 2023-11-22 ENCOUNTER — Other Ambulatory Visit: Payer: Self-pay

## 2023-12-01 ENCOUNTER — Other Ambulatory Visit (HOSPITAL_COMMUNITY): Payer: Self-pay

## 2023-12-13 ENCOUNTER — Encounter: Payer: Self-pay | Admitting: Internal Medicine

## 2023-12-13 ENCOUNTER — Other Ambulatory Visit (HOSPITAL_COMMUNITY): Payer: Self-pay

## 2023-12-16 ENCOUNTER — Ambulatory Visit
Admission: RE | Admit: 2023-12-16 | Discharge: 2023-12-16 | Disposition: A | Discharge status: Home / Self Care | Source: Ambulatory Visit | Attending: Nurse Practitioner | Admitting: Nurse Practitioner

## 2023-12-16 VITALS — BP 117/83 | HR 91 | Temp 98.6°F | Resp 18

## 2023-12-16 DIAGNOSIS — B349 Viral infection, unspecified: Secondary | ICD-10-CM | POA: Diagnosis not present

## 2023-12-16 LAB — POC COVID19/FLU A&B COMBO
Covid Antigen, POC: NEGATIVE
Influenza A Antigen, POC: NEGATIVE
Influenza B Antigen, POC: NEGATIVE

## 2023-12-16 MED ORDER — ONDANSETRON 4 MG PO TBDP
4.0000 mg | ORAL_TABLET | Freq: Three times a day (TID) | ORAL | 0 refills | Status: DC | PRN
Start: 2023-12-16 — End: 2024-01-05

## 2023-12-16 MED ORDER — FLUTICASONE PROPIONATE 50 MCG/ACT NA SUSP: 2.0000 | Freq: Every day | NASAL | 0 refills | Status: DC

## 2023-12-16 NOTE — ED Provider Notes (Signed)
 [facility]    CSN: [id number] Arrival date & time: 12/16/23  9073      History   Chief Complaint Chief Complaint  Patient presents with   Influenza    Entered by patient    HPI Melissa Reese is a 23 y.o. female.    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) 09/17/2016   Iron deficiency anemia 09/12/2016   Hypothyroidism 01/30/2011   Type 1 diabetes mellitus without complication 01/30/2011    Past Surgical History:  Procedure Laterality Date   COLONOSCOPY WITH PROPOFOL  N/A 04/29/2023   Procedure: COLONOSCOPY WITH PROPOFOL ;  Surgeon: Therisa Bi, MD;  Location: [facility] ENDOSCOPY;  Service: Gastroenterology;  Laterality: N/A;   ESOPHAGOGASTRODUODENOSCOPY (EGD) WITH PROPOFOL  N/A 04/29/2023   Procedure: ESOPHAGOGASTRODUODENOSCOPY (EGD) WITH PROPOFOL ;  Surgeon: Therisa Bi, MD;  Location: [facility] ENDOSCOPY;  Service: Gastroenterology;  Laterality: N/A;   GIVENS CAPSULE STUDY N/A 06/21/2023   Procedure: IMAGING PROCEDURE, GI TRACT, INTRALUMINAL, VIA CAPSULE;  Surgeon: Therisa Bi, MD;  Location: [facility] ENDOSCOPY;  Service: Gastroenterology;  Laterality: N/A;    OB History   No obstetric history on file.      Home Medications    Prior to Admission medications   Medication Sig Start Date End Date Taking? Authorizing Provider  fluticasone  (FLONASE ) 50 MCG/ACT nasal spray Place 2 sprays into both nostrils daily. 12/16/23  Yes Leath-Warren, Etta PARAS, NP  ondansetron (ZOFRAN-ODT) 4 MG disintegrating tablet Take 1 tablet (4 mg total) by mouth every 8 (eight) hours as needed. 12/16/23  Yes Leath-Warren, Etta PARAS, NP  ascorbic acid (VITAMIN C) 500 MG tablet Take by mouth.    [provider]   Continuous Glucose Sensor (DEXCOM G7 SENSOR) MISC Use to check glucose continuously, change sensor every 10 days 04/30/23   Trixie File, MD  cyanocobalamin  (VITAMIN B12) 1000 MCG/ML injection Patient to administer intramuscular 1 time weekly for 4 weeks, then 1 time monthly for 3 months. 10/26/23   Allwardt, Mardy HERO, PA-C  ferrous sulfate 325 (65 FE) MG EC tablet Take by mouth.    [provider]  FLUCELVAX 0.5 ML injection  11/24/22   [provider]  Glucagon  3 MG/DOSE POWD PLACE 3 MG INTO THE NOSE ONCE AS NEEDED FOR UP TO 1 DOSE. 05/10/20 10/25/23  Trixie File, MD  glucose blood (ACCU-CHEK GUIDE) test strip Use to check blood sugar 4 times a day. 04/06/22   Trixie File, MD  Insulin  Disposable Pump (OMNIPOD 5 DEXG7G6 PODS GEN 5) MISC Use as directed every 3 (three) days. 02/22/23   Trixie File, MD  insulin  lispro (HUMALOG ) 100 UNIT/ML injection Use up to 70 units via insulin  pump. 03/16/23   Trixie File, MD  levothyroxine  (SYNTHROID ) 125 MCG tablet Take 1 tablet (125 mcg total) by mouth daily. 10/26/23   Trixie File, MD  meloxicam  (MOBIC ) 15 MG tablet Take 1 tablet (15 mg total) by mouth daily. 06/07/23   McCaughan, Dia D, DPM  Norgestimate -Ethinyl Estradiol Triphasic (TRI-VYLIBRA ) 0.18/0.215/0.25 MG-35 MCG tablet Take 1 tablet by mouth daily. 07/29/23   Allwardt, Alyssa M, PA-C  Semaglutide ,0.25 or 0.5MG /DOS, 2 MG/3ML SOPN Inject 0.5 mg into the skin once a week. 06/25/23   Gherghe,  Lela, MD  SYRINGE-NEEDLE, DISP, 3 ML 25G X 1 3 ML MISC Use to administer B-12 injections 1 time weekly for 4 weeks and then 1 time monthly for 3 months to complete series. 03/04/23   Allwardt, Mardy HERO, PA-C    Family History Family History  Problem Relation Age of Onset   Allergies Father    Diabetes Father     Social History Social History   Tobacco Use   Smoking status: Never   Smokeless tobacco: Never  Substance Use Topics   Alcohol use: No   Drug use: No      Allergies   Patient has no known allergies.   Review of Systems Review of Systems   Physical Exam Triage Vital Signs ED Triage Vitals  Encounter Vitals Group     BP 12/16/23 0935 117/83     Girls Systolic BP Percentile --      Girls Diastolic BP Percentile --      Boys Systolic BP Percentile --      Boys Diastolic BP Percentile --      Pulse Rate 12/16/23 0935 91     Resp 12/16/23 0935 18     Temp 12/16/23 0935 98.6 F (37 C)     Temp Source 12/16/23 0935 Oral     SpO2 12/16/23 0935 95 %     Weight --      Height --      Head Circumference --      Peak Flow --      Pain Score 12/16/23 0936 8     Pain Loc --      Pain Education --      Exclude from Growth Chart --    No data found.  Updated Vital Signs BP 117/83 (BP Location: Right Arm)   Pulse 91   Temp 98.6 F (37 C) (Oral)   Resp 18   LMP 12/16/2023 (Exact Date)   SpO2 95%   Visual Acuity Right Eye Distance:   Left Eye Distance:   Bilateral Distance:    Right Eye Near:   Left Eye Near:    Bilateral Near:     Physical Exam Vitals and nursing note reviewed.  Constitutional:      General: She is not in acute distress.    Appearance: Normal appearance.  HENT:     Head: Normocephalic.     Right Ear: Tympanic membrane, ear canal and external ear normal.     Left Ear: Tympanic membrane, ear canal and external ear normal.     Nose: Congestion present.     Mouth/Throat:     Mouth: Mucous membranes are moist.  Eyes:     Extraocular Movements: Extraocular movements intact.     Conjunctiva/sclera: Conjunctivae normal.     Pupils: Pupils are equal, round, and reactive to light.  Cardiovascular:     Rate and Rhythm: Normal rate and regular rhythm.     Pulses: Normal pulses.     Heart sounds: Normal heart sounds.  Pulmonary:     Effort: Pulmonary effort is normal. No respiratory distress.     Breath sounds: Normal breath sounds. No stridor. No wheezing, rhonchi or rales.  Abdominal:     General:  Bowel sounds are normal.     Palpations: Abdomen is soft.     Tenderness: There is no abdominal tenderness.  Musculoskeletal:     Cervical back: Normal range of motion.  Skin:    General: Skin is warm and dry.  Neurological:     General: No focal deficit present.     Mental Status: She is alert and oriented to person, place, and time.  Psychiatric:        Mood and Affect: Mood normal.        Behavior: Behavior normal.      UC Treatments / Results  Labs (all labs ordered are listed, but only abnormal results are displayed) Labs Reviewed  POC COVID19/FLU A&B COMBO - Normal    EKG   Radiology No results found.  Procedures Procedures (including critical care time)  Medications Ordered in UC Medications - No data to display  Initial Impression / Assessment and Plan / UC Course  I have reviewed the triage vital signs and the nursing notes.  Pertinent labs & imaging results that were available during my care of the patient were reviewed by me and considered in my medical decision making (see chart for details).  The COVID/flu test was negative.  On exam, the patient is well-appearing, she is in no acute distress, vital signs are stable.  Symptoms consistent with viral illness at this time.  Symptomatic treatment provided with ondansetron 4 mg ODT for nausea and fluticasone  50 mcg nasal spray for nasal congestion and runny nose.  Supportive care recommendations were provided and discussed with the patient to include fluids, rest, over-the-counter analgesics, dietary modifications, and a BRAT diet.  Discussed indications with patient regarding ER follow-up.  Patient also advised to follow-up with her PCP if symptoms fail to improve.  Patient was in agreement with this plan of care and verbalizes understanding.  All questions were answered.  Patient stable for discharge.  Work note was provided.  Final Clinical Impressions(s) / UC Diagnoses   Final diagnoses:  Viral illness      Discharge Instructions      The COVID/flu test was negative. Take medication as prescribed. Increase fluids and allow for plenty of rest.  Recommend Pedialyte or sugar-free Gatorade to help prevent dehydration. You may take over-the-counter Tylenol  or ibuprofen  as needed for pain, fever, or general discomfort. Recommend a BRAT diet to include bananas, rice, applesauce, and toast while your nausea persist. Recommend dietary changes to help decrease your diarrhea. As discussed, if your diarrhea does not improve, or if you develop worsening nausea with new vomiting uncontrolled by medication, please go to the emergency department for further evaluation. Symptoms should improve within the next 5 to 7 days.  If symptoms fail to improve, or begin to worsen, you may follow-up in this clinic or with your primary care physician for further evaluation. Follow-up as needed.     ED Prescriptions     Medication Sig Dispense Auth. Provider   ondansetron (ZOFRAN-ODT) 4 MG disintegrating tablet Take 1 tablet (4 mg total) by mouth every 8 (eight) hours as needed. 20 tablet Leath-Warren, Etta PARAS, NP   fluticasone  (FLONASE ) 50 MCG/ACT nasal spray Place 2 sprays into both nostrils daily. 16 g Leath-Warren, Etta PARAS, NP      PDMP not reviewed this encounter.   Gilmer Etta PARAS, NP 12/16/23 [phone]

## 2023-12-16 NOTE — ED Triage Notes (Signed)
 Pt reports fatigue, chills, body aches, congestion,headache, nausea, diarrhea, started Tuesday, has been using the hydration van to stay hydrated. Otc pain relievers.

## 2023-12-16 NOTE — Discharge Instructions (Signed)
 The COVID/flu test was negative. Take medication as prescribed. Increase fluids and allow for plenty of rest.  Recommend Pedialyte or sugar-free Gatorade to help prevent dehydration. You may take over-the-counter Tylenol  or ibuprofen  as needed for pain, fever, or general discomfort. Recommend a BRAT diet to include bananas, rice, applesauce, and toast while your nausea persist. Recommend dietary changes to help decrease your diarrhea. As discussed, if your diarrhea does not improve, or if you develop worsening nausea with new vomiting uncontrolled by medication, please go to the emergency department for further evaluation. Symptoms should improve within the next 5 to 7 days.  If symptoms fail to improve, or begin to worsen, you may follow-up in this clinic or with your primary care physician for further evaluation. Follow-up as needed.

## 2023-12-22 ENCOUNTER — Other Ambulatory Visit (HOSPITAL_COMMUNITY): Payer: Self-pay

## 2023-12-24 ENCOUNTER — Ambulatory Visit
Admission: RE | Admit: 2023-12-24 | Discharge: 2023-12-24 | Disposition: A | Discharge status: Home / Self Care | Source: Ambulatory Visit | Attending: Nurse Practitioner | Admitting: Nurse Practitioner

## 2023-12-24 ENCOUNTER — Other Ambulatory Visit (HOSPITAL_COMMUNITY): Payer: Self-pay

## 2023-12-24 ENCOUNTER — Other Ambulatory Visit: Payer: Self-pay

## 2023-12-24 VITALS — BP 138/80 | HR 125 | Temp 101.1°F | Resp 18

## 2023-12-24 DIAGNOSIS — U071 COVID-19: Secondary | ICD-10-CM

## 2023-12-24 LAB — POC COVID19/FLU A&B COMBO
Covid Antigen, POC: POSITIVE — AB
Influenza A Antigen, POC: NEGATIVE
Influenza B Antigen, POC: NEGATIVE

## 2023-12-24 MED ORDER — BENZONATATE 100 MG PO CAPS: 100.0000 mg | ORAL_CAPSULE | Freq: Three times a day (TID) | ORAL | 0 refills | Status: DC | PRN

## 2023-12-24 MED ORDER — PAXLOVID (300/100) 20 X 150 MG & 10 X 100MG PO TBPK: 3.0000 | ORAL_TABLET | Freq: Two times a day (BID) | ORAL | 0 refills | Status: AC

## 2023-12-24 MED ORDER — ACETAMINOPHEN 325 MG PO TABS
650.0000 mg | ORAL_TABLET | Freq: Once | ORAL | Status: AC
  Administered 2023-12-24: 650 mg via ORAL

## 2023-12-24 NOTE — ED Triage Notes (Signed)
 Pt reports sore throat, cough, chills, nasal congestion since Tuesday night. Denies any known fevers prior to vitals being obtained at [facility]. Has not had tylenol  or ibuprofen  today.

## 2023-12-24 NOTE — ED Provider Notes (Signed)
 [facility]    CSN: [id number] Arrival date & time: 12/24/23  1123      History   Chief Complaint Chief Complaint  Patient presents with   Sore Throat    Entered by patient    HPI Melissa Reese is a 23 y.o. female.   Patient presents today for 3-day history of fever, body aches and chills, dry cough, chest congestion, postnasal drainage and sore throat, headache, decreased appetite, and fatigue.  No shortness of breath or chest pain or tightness, ear pain, abdominal pain, nausea/vomiting, or diarrhea.  No known sick contacts.  Reports she recently had a stomach bug that she got over last week.  Has been taking Tylenol  and NyQuil for symptoms with minimal temporary improvement.  Medical history significant for type 1 diabetes.  Patient reports blood sugars have been in the high 200s.  She noticed today after she was triaged that her OmniPod tubing was loose and so she replaced her OmniPod and notes that her blood sugar is now coming down.    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) 09/17/2016   Iron deficiency anemia 09/12/2016   Hypothyroidism 01/30/2011   Type 1 diabetes mellitus without complication 01/30/2011    Past Surgical History:  Procedure Laterality Date   COLONOSCOPY WITH PROPOFOL  N/A 04/29/2023   Procedure: COLONOSCOPY WITH PROPOFOL ;  Surgeon: Therisa Bi, MD;  Location: [facility] ENDOSCOPY;  Service: Gastroenterology;  Laterality: N/A;   ESOPHAGOGASTRODUODENOSCOPY (EGD) WITH PROPOFOL  N/A 04/29/2023   Procedure: ESOPHAGOGASTRODUODENOSCOPY (EGD) WITH PROPOFOL ;  Surgeon: Therisa Bi, MD;  Location: [facility] ENDOSCOPY;  Service: Gastroenterology;  Laterality: N/A;   GIVENS CAPSULE STUDY N/A 06/21/2023    Procedure: IMAGING PROCEDURE, GI TRACT, INTRALUMINAL, VIA CAPSULE;  Surgeon: Therisa Bi, MD;  Location: [facility] ENDOSCOPY;  Service: Gastroenterology;  Laterality: N/A;    OB History   No obstetric history on file.      Home Medications    Prior to Admission medications   Medication Sig Start Date End Date Taking? Authorizing Provider  benzonatate  (TESSALON ) 100 MG capsule Take 1 capsule (100 mg total) by mouth 3 (three) times daily as needed for cough. Do not take with alcohol or while operating or driving heavy machinery 89/75/74  Yes Chandra Raisin A, NP  nirmatrelvir/ritonavir (PAXLOVID, 300/100,) 20 x 150 MG & 10 x 100MG  TBPK Take 3 tablets by mouth 2 (two) times daily for 5 days. Patient GFR is 108.  Take nirmatrelvir (150 mg) two tablets twice daily for 5 days and ritonavir (100 mg) one tablet twice daily for 5 days. 12/24/23 12/29/23 Yes Chandra Raisin LABOR, NP  ascorbic acid (VITAMIN C) 500 MG tablet Take by mouth.    [provider]  Continuous Glucose Sensor (DEXCOM G7 SENSOR) MISC Use to check glucose continuously, change sensor every 10 days 04/30/23   Trixie File, MD  cyanocobalamin  (VITAMIN B12) 1000 MCG/ML injection Patient to administer intramuscular 1 time weekly for 4 weeks, then 1 time monthly for 3 months. 10/26/23   Allwardt, Mardy HERO, PA-C  ferrous sulfate 325 (65 FE) MG EC tablet Take by mouth.    [provider]  FLUCELVAX 0.5 ML injection  11/24/22   [provider]  fluticasone  (  FLONASE ) 50 MCG/ACT nasal spray Place 2 sprays into both nostrils daily. 12/16/23   Leath-Warren, Etta PARAS, NP  Glucagon  3 MG/DOSE POWD PLACE 3 MG INTO THE NOSE ONCE AS NEEDED FOR UP TO 1 DOSE. 05/10/20 10/25/23  Trixie File, MD  glucose blood (ACCU-CHEK GUIDE) test strip Use to check blood sugar 4 times a day. 04/06/22   Trixie File, MD  Insulin  Disposable Pump (OMNIPOD 5 DEXG7G6 PODS GEN 5) MISC Use as directed every 3 (three) days. 02/22/23    Trixie File, MD  insulin  lispro (HUMALOG ) 100 UNIT/ML injection Use up to 70 units via insulin  pump. 03/16/23   Trixie File, MD  levothyroxine  (SYNTHROID ) 125 MCG tablet Take 1 tablet (125 mcg total) by mouth daily. 10/26/23   Trixie File, MD  meloxicam  (MOBIC ) 15 MG tablet Take 1 tablet (15 mg total) by mouth daily. 06/07/23   McCaughan, Dia D, DPM  Norgestimate -Ethinyl Estradiol Triphasic (TRI-VYLIBRA ) 0.18/0.215/0.25 MG-35 MCG tablet Take 1 tablet by mouth daily. 07/29/23   Allwardt, Alyssa M, PA-C  ondansetron (ZOFRAN-ODT) 4 MG disintegrating tablet Take 1 tablet (4 mg total) by mouth every 8 (eight) hours as needed. 12/16/23   Leath-Warren, Etta PARAS, NP  Semaglutide ,0.25 or 0.5MG /DOS, 2 MG/3ML SOPN Inject 0.5 mg into the skin once a week. 06/25/23   Trixie File, MD  SYRINGE-NEEDLE, DISP, 3 ML 25G X 1 3 ML MISC Use to administer B-12 injections 1 time weekly for 4 weeks and then 1 time monthly for 3 months to complete series. 03/04/23   Allwardt, Mardy HERO, PA-C    Family History Family History  Problem Relation Age of Onset   Allergies Father    Diabetes Father     Social History Social History   Tobacco Use   Smoking status: Never   Smokeless tobacco: Never  Substance Use Topics   Alcohol use: No   Drug use: No     Allergies   Patient has no known allergies.   Review of Systems Review of Systems   Physical Exam Triage Vital Signs ED Triage Vitals  Encounter Vitals Group     BP 12/24/23 1135 138/80     Girls Systolic BP Percentile --      Girls Diastolic BP Percentile --      Boys Systolic BP Percentile --      Boys Diastolic BP Percentile --      Pulse Rate 12/24/23 1135 (!) 125     Resp 12/24/23 1135 18     Temp 12/24/23 1135 (!) 101.1 F (38.4 C)     Temp Source 12/24/23 1135 Oral     SpO2 12/24/23 1135 99 %     Weight --      Height --      Head Circumference --      Peak Flow --      Pain Score 12/24/23 1136 7     Pain Loc --       Pain Education --      Exclude from Growth Chart --    No data found.  Updated Vital Signs BP 138/80 (BP Location: Right Arm)   Pulse (!) 125   Temp (!) 101.1 F (38.4 C) (Oral)   Resp 18   LMP 12/16/2023 (Exact Date)   SpO2 99%   Temperature recheck: 99.8 F Heart rate rechecked: 112  Visual Acuity Right Eye Distance:   Left Eye Distance:   Bilateral Distance:    Right Eye Near:   Left Eye Near:  Bilateral Near:     Physical Exam Vitals and nursing note reviewed.  Constitutional:      General: She is not in acute distress.    Appearance: Normal appearance. She is ill-appearing. She is not toxic-appearing.  HENT:     Head: Normocephalic and atraumatic.     Right Ear: Tympanic membrane, ear canal and external ear normal. No drainage, swelling or tenderness. No middle ear effusion. Tympanic membrane is not erythematous.     Left Ear: Tympanic membrane, ear canal and external ear normal. No drainage, swelling or tenderness.  No middle ear effusion. Tympanic membrane is not erythematous.     Nose: No congestion or rhinorrhea.     Mouth/Throat:     Mouth: Mucous membranes are moist.     Pharynx: Oropharynx is clear. No oropharyngeal exudate or posterior oropharyngeal erythema.  Eyes:     General: No scleral icterus.    Extraocular Movements: Extraocular movements intact.  Cardiovascular:     Rate and Rhythm: Regular rhythm. Tachycardia present.  Pulmonary:     Effort: Pulmonary effort is normal. No respiratory distress.     Breath sounds: Normal breath sounds. No wheezing, rhonchi or rales.  Musculoskeletal:     Cervical back: Normal range of motion and neck supple.  Lymphadenopathy:     Cervical: No cervical adenopathy.  Skin:    General: Skin is warm and dry.     Coloration: Skin is not jaundiced or pale.     Findings: No erythema or rash.  Neurological:     Mental Status: She is alert and oriented to person, place, and time.  Psychiatric:        Behavior:  Behavior is cooperative.      UC Treatments / Results  Labs (all labs ordered are listed, but only abnormal results are displayed) Labs Reviewed  POC COVID19/FLU A&B COMBO - Abnormal; Notable for the following components:      Result Value   Covid Antigen, POC Positive (*)    All other components within normal limits    EKG   Radiology No results found.  Procedures Procedures (including critical care time)  Medications Ordered in UC Medications  acetaminophen  (TYLENOL ) tablet 650 mg (650 mg Oral Given 12/24/23 1144)    Initial Impression / Assessment and Plan / UC Course  I have reviewed the triage vital signs and the nursing notes.  Pertinent labs & imaging results that were available during my care of the patient were reviewed by me and considered in my medical decision making (see chart for details).   In triage, patient is initially febrile and tachycardic, this improves with Tylenol .  She is normotensive, not tachypneic, SpO2 is normal on room air.  She is in no acute distress but appears ill.  1. COVID-19 Vitals and exam are reassuring Given history of type 1 diabetes, patient is a good candidate for Paxlovid Recent lab work reviewed; GFR greater than 100, Paxlovid sent to pharmacy We discussed that estrogen levels may be increased with oral hormone replacement for contraception and that she may have some irregular bleeding after taking Paxlovid We discussed using backup method while on Paxlovid for contraception Other supportive care discussed for viral symptoms including Tessalon  Perles, guaifenesin, increasing water intake Return and ER precautions discussed Work excuse provided  The patient was given the opportunity to ask questions.  All questions answered to their satisfaction.  The patient is in agreement to this plan.   Final Clinical Impressions(s) / UC Diagnoses  Final diagnoses:  COVID-19     Discharge Instructions      You tested positive  for COVID-19 today.  Take Paxlovid as prescribed to treat it.  Symptoms should improve over the next week to 10 days.  If you develop chest pain or shortness of breath, go to the emergency room.  Some things that can make you feel better are: - Increased rest - Increasing fluid with water/sugar free electrolytes - Acetaminophen  and ibuprofen  as needed for fever/pain - Salt water gargling, chloraseptic spray and throat lozenges - OTC guaifenesin (Mucinex) 600 mg twice daily - Saline sinus flushes or a neti pot - Humidifying the air -Tessalon  Perles every 8 hours as needed for dry cough      ED Prescriptions     Medication Sig Dispense Auth. Provider   nirmatrelvir/ritonavir (PAXLOVID, 300/100,) 20 x 150 MG & 10 x 100MG  TBPK Take 3 tablets by mouth 2 (two) times daily for 5 days. Patient GFR is 108.  Take nirmatrelvir (150 mg) two tablets twice daily for 5 days and ritonavir (100 mg) one tablet twice daily for 5 days. 30 tablet Chandra Raisin A, NP   benzonatate  (TESSALON ) 100 MG capsule Take 1 capsule (100 mg total) by mouth 3 (three) times daily as needed for cough. Do not take with alcohol or while operating or driving heavy machinery 30 capsule Chandra Raisin LABOR, NP      PDMP not reviewed this encounter.   Chandra Raisin LABOR, NP 12/24/23 1253

## 2023-12-24 NOTE — Discharge Instructions (Signed)
 You tested positive for COVID-19 today.  Take Paxlovid as prescribed to treat it.  Symptoms should improve over the next week to 10 days.  If you develop chest pain or shortness of breath, go to the emergency room.  Some things that can make you feel better are: - Increased rest - Increasing fluid with water/sugar free electrolytes - Acetaminophen  and ibuprofen  as needed for fever/pain - Salt water gargling, chloraseptic spray and throat lozenges - OTC guaifenesin (Mucinex) 600 mg twice daily - Saline sinus flushes or a neti pot - Humidifying the air -Tessalon  Perles every 8 hours as needed for dry cough

## 2024-01-05 ENCOUNTER — Encounter: Payer: Self-pay | Admitting: Internal Medicine

## 2024-01-05 ENCOUNTER — Ambulatory Visit: Admitting: Internal Medicine

## 2024-01-05 ENCOUNTER — Other Ambulatory Visit (HOSPITAL_COMMUNITY): Payer: Self-pay

## 2024-01-05 ENCOUNTER — Other Ambulatory Visit: Payer: Self-pay

## 2024-01-05 ENCOUNTER — Other Ambulatory Visit

## 2024-01-05 VITALS — BP 120/80 | Ht 62.6 in | Wt 147.0 lb

## 2024-01-05 DIAGNOSIS — E039 Hypothyroidism, unspecified: Secondary | ICD-10-CM

## 2024-01-05 DIAGNOSIS — R5383 Other fatigue: Secondary | ICD-10-CM

## 2024-01-05 DIAGNOSIS — E1065 Type 1 diabetes mellitus with hyperglycemia: Secondary | ICD-10-CM

## 2024-01-05 DIAGNOSIS — Z9641 Presence of insulin pump (external) (internal): Secondary | ICD-10-CM

## 2024-01-05 DIAGNOSIS — E1042 Type 1 diabetes mellitus with diabetic polyneuropathy: Secondary | ICD-10-CM | POA: Diagnosis not present

## 2024-01-05 LAB — POCT GLYCOSYLATED HEMOGLOBIN (HGB A1C): Hemoglobin A1C: 6.7 % — AB (ref 4.0–5.6)

## 2024-01-05 NOTE — Addendum Note (Signed)
 Addended by: CLEOTILDE ROLIN RAMAN on: 01/05/2024 03:26 PM   Modules accepted: Orders

## 2024-01-05 NOTE — Progress Notes (Signed)
 Patient ID: VEE BAHE, female   DOB: 06-Aug-2000, 23 y.o.   MRN: [id number]   HPI: Melissa Reese is a 23 y.o.-year-old very pleasant female, initially referred by her PCP, Dr. 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 Prev. seen at [facility]. Last visit with me 2.5 months ago.  Interim history: She mentions that she is already scheduled the appointment sooner due to significant fatigue, brain fog, muscle spasms in L arm.  She is overall feeling poorly and she has problems concentrating at work - she was recently promoted at work and she is more busy and has to travel more (driving). She prev. had EGD, colonoscopy and also capsule endoscopy to investigate for anemia. She is taking it but not quite every day. She continues B12 injections - last was 5 days ago.  Insulin  pump: - Started at 23 years old - OneTouch ping - started 01/2015 - Medtronic 670 G insulin  pump + guardian CGM-started 09/2016 (band aid to keep the CGM in place: Simplatch). She had a crack in her pump -received another pump 01/2018.   - Medtronic 770 G started 01/2019 - renewed 03/2021.   - now OmniPod 5 pump - started 10/2022 (3 mo 326$)  Meter:  -Accu-Chek guide link - $$ under new insurance  CGM: -Medtronic Guardian -now Dexcom G7   Insulin : -FiAsp  (PA approved) >> 198$ for 3 mo -tried Lyumjev  but $ - now Lispro  DM1:  Reviewed HbA1c levels: 10/22/2023: HbA1c 6.5% Lab Results  Component Value Date   HGBA1C 6.3 (A) 06/25/2023   HGBA1C 7.1 (A) 02/22/2023   HGBA1C 6.9 12/15/2022   HGBA1C 6.5 (A) 10/20/2022   HGBA1C 7.6 (A) 02/17/2022   HGBA1C 7.3 (A) 10/17/2021   HGBA1C 7.5 (A) 05/29/2021   HGBA1C 7.5 (A) 01/27/2021   HGBA1C 7.4 (A) 09/20/2020   HGBA1C 7.5 (A) 05/10/2020   HGBA1C 7.2 (A) 01/10/2020   HGBA1C 6.8 (A) 08/31/2019   HGBA1C 6.9 (A) 03/24/2019   HGBA1C 6.7 (A) 11/22/2018    HGBA1C 8.0 (A) 07/19/2018   HGBA1C 8.0 (A) 03/14/2018   HGBA1C 8.7 (H) 08/24/2017   HGBA1C 8.9 04/09/2017   HGBA1C 8.4 12/22/2016   HGBA1C 9.7 (H) 09/07/2016  Prev.  06/24/2022: HbA1c 7.6% 02/20/2016: HbA1c 7.2% 11/21/2015: HbA1c 7.4% 08/21/2015: HbA1c 7.7% 04/24/2015: HbA1c 7.8%  She is on: - Ozempic  0.25 >> 0.5 mg weekly - started 06/2023, off now  Pump settings: - basal rates: 12 am: 1.25 3 am: 1.15 6 am: 1.20 >> 1.15 11 am: 1.20 5 pm: 1.15 9 pm: 1.30 - ICR:   12 am: 9  5 am: 6  3 pm : 6 - target:  12 am: 110-120 - ISF:  12 am: 45  5:30 am: 45  9:30 pm: 45 - Insulin  on Board: 4h - bolus wizard: on  Calibrate the sensor 2-4 times a day. You may need a temporary basal rate of 80% for the night after exercise.  TDD from basal insulin : 63% >> 74% (16 units) >> 50% (21 units) TDD from bolus insulin : 37% >> 26% (60 units) >> 50% (20 units) TDD 38 to 70 >> 22-50 >> 40-60 units - extended bolusing: not using - changes infusion site: Every every 4 to 5 days >> every 6-7 days >> every 2-4 days.   She checks her sugars more than 4 times a day with her CGM:  Prev.:  Previously:    Lowest sugar: 40s at the beach >> 50s >> 47 >> 60 s. She has hypoglycemia awareness at 7s.  She had previous visits to the ED for hypoglycemia years ago, not recently. Highest sugar:  >400s >> 292 >> 300 (suspended the pump before surgery) >> >400 (site) >> 346 >> 400s. No previous DKA admissions.  No CKD, last BUN/creatinine:   10/22/2023: 8/1.0, GFR 80, glucose 174 Lab Results  Component Value Date   BUN 12 02/22/2023   BUN 9 12/15/2022   CREATININE 0.86 02/22/2023   CREATININE 0.9 12/15/2022   Lab Results  Component Value Date   MICRALBCREAT 10 10/22/2023   MICRALBCREAT 8 02/22/2023  Not on ACE inhibitor/ARB.  + HL; last set of lipids: 10/22/2023: 226/135/51/151 Lab Results  Component Value Date   CHOL 199 02/22/2023   HDL 66 02/22/2023   LDLCALC 110 (H)  02/22/2023   TRIG 120 02/22/2023   CHOLHDL 3.0 02/22/2023  Not on a statin.  - latest eye exam: 12/16/2022: No DR reportedly.  - + numbness and tingling in her feet.  She has ingrown toenails.  Last foot exam 06/07/2023-Dr. McCaughan.  Hypothyroidism: - Uncontrolled  in the past due to incomplete compliance with levothyroxine  100 - Before our visit in 10/2023, she missed levothyroxine  doses while at the beach.  Pt is on levothyroxine  125 mcg daily, taken: - in am ~7 am - fasting - at least 30 min from b'fast - no calcium - + iron (2 pm) - no multivitamins - no PPIs - not on Biotin Prev. on B12 shots >> missed doses since 04/2023.  Reviewed her TFTs:  10/22/2023: TSH 59.8 Lab Results  Component Value Date   TSH 2.77 02/22/2023   TSH 2.99 02/17/2022   TSH 0.33 (L) 10/17/2021   TSH 25.32 (H) 05/29/2021   TSH 0.25 (L) 01/27/2021   TSH 0.12 (L) 09/20/2020   TSH 25.79 (H) 05/10/2020   TSH 4.88 (H) 01/10/2020   TSH 7.24 (H) 08/31/2019   TSH 0.27 (L) 03/24/2019  11/21/2015: TSH 6.397 08/21/2015: TSH 41.268 04/24/2015: TSH 14.788 08/16/2014: TSH 0.407  Pt denies: - feeling nodules in neck - hoarseness - dysphagia - choking  10/22/2023:  Vitamin B12 218 ([phone]) Ferritin 4  She started college in August 2018 >> May 2022. Sociology major; + Masters.   ROS: + see HPI   I reviewed pt's medications, allergies, PMH, social hx, family hx, and changes were documented in the history of present illness. Otherwise, unchanged from my initial visit note.  Past Medical History:  Diagnosis Date   Anemia 2018   Diabetes mellitus without complication (HCC)    Heart murmur    Thyroid  disease     Social History   Social History   Marital status: Single    Spouse name: N/A   Number of children: 0   Occupational History   student   Social History Main Topics   Smoking status: Never Smoker   Smokeless tobacco: Never Used   Alcohol use No   Drug use: No   Current  Outpatient Medications on File Prior to Visit  Medication Sig Dispense Refill   ascorbic acid (VITAMIN C) 500 MG tablet Take by mouth.     benzonatate  (TESSALON ) 100 MG capsule Take 1 capsule (100 mg total) by mouth 3 (three) times daily as needed for cough. Do not take with alcohol or while operating or driving heavy machinery 30 capsule 0   Continuous Glucose Sensor (DEXCOM G7  SENSOR) MISC Use to check glucose continuously, change sensor every 10 days 9 each 3   cyanocobalamin  (VITAMIN B12) 1000 MCG/ML injection Patient to administer intramuscular 1 time weekly for 4 weeks, then 1 time monthly for 3 months. 1 mL 7   ferrous sulfate 325 (65 FE) MG EC tablet Take by mouth.     FLUCELVAX 0.5 ML injection  (Patient not taking: Reported on 10/25/2023)     fluticasone  (FLONASE ) 50 MCG/ACT nasal spray Place 2 sprays into both nostrils daily. 16 g 0   Glucagon  3 MG/DOSE POWD PLACE 3 MG INTO THE NOSE ONCE AS NEEDED FOR UP TO 1 DOSE. 1 each 11   glucose blood (ACCU-CHEK GUIDE) test strip Use to check blood sugar 4 times a day. 400 each 12   Insulin  Disposable Pump (OMNIPOD 5 DEXG7G6 PODS GEN 5) MISC Use as directed every 3 (three) days. 30 each 3   insulin  lispro (HUMALOG ) 100 UNIT/ML injection Use up to 70 units via insulin  pump. 70 mL 4   levothyroxine  (SYNTHROID ) 125 MCG tablet Take 1 tablet (125 mcg total) by mouth daily. 90 tablet 3   meloxicam  (MOBIC ) 15 MG tablet Take 1 tablet (15 mg total) by mouth daily. 30 tablet 1   Norgestimate -Ethinyl Estradiol Triphasic (TRI-VYLIBRA ) 0.18/0.215/0.25 MG-35 MCG tablet Take 1 tablet by mouth daily. 84 tablet 3   ondansetron (ZOFRAN-ODT) 4 MG disintegrating tablet Take 1 tablet (4 mg total) by mouth every 8 (eight) hours as needed. 20 tablet 0   Semaglutide ,0.25 or 0.5MG /DOS, 2 MG/3ML SOPN Inject 0.5 mg into the skin once a week. 9 mL 3   SYRINGE-NEEDLE, DISP, 3 ML 25G X 1 3 ML MISC Use to administer B-12 injections 1 time weekly for 4 weeks and then 1 time  monthly for 3 months to complete series. 7 each 0   No current facility-administered medications on file prior to visit.   No Known Allergies Family History  Problem Relation Age of Onset   Allergies Father    Diabetes Father    PE: BP 120/80   Ht 5' 2.6 (1.59 m)   Wt 147 lb (66.7 kg)   LMP 12/16/2023 (Exact Date)   BMI 26.37 kg/m  Wt Readings from Last 3 Encounters:  01/05/24 147 lb (66.7 kg)  10/25/23 147 lb 9.6 oz (67 kg)  07/29/23 158 lb (71.7 kg)   Constitutional: normal weight, in NAD Eyes: EOMI, no exophthalmos ENT: no thyromegaly, no cervical lymphadenopathy Cardiovascular: RRR, No MRG Respiratory: CTA B Musculoskeletal: no deformities Skin:  no rashes Neurological: no tremor with outstretched hands  1. DM1 + 2, uncontrolled, with complications - PN  2. Insulin  pump treatment   3. Hypothyroidism  PLAN:  1. And 2.  Patient with longstanding, uncontrolled, type 1 diabetes, with improvement in control after switching from the Medtronic 770 G insulin  pump to the OmniPod 5 insulin  pump.  She also uses a Dexcom G7 CGM.  At last visit, HbA1c was at goal, at 6.5%, slightly increased.  She was able to start a GLP-1 receptor agonist and tolerating well the 0.5 mg weekly dose.  She lost 17 pounds before last visit, and sugars were fluctuating mostly within the target range with only occasional higher blood sugars after meals.  She was not bolusing consistently before meals and many times when the sugars were really high after meals and we discussed about how to bolus correctly and consistently.  We did not change the insulin  pump settings otherwise. CGM interpretation: -At  today's visit, we reviewed her CGM downloads: It appears that 61% of values are in target range (goal >70%), while 39% are higher than 180 (goal <25%), and 0% are lower than 70 (goal <4%).  The calculated average blood sugar is 172.  The projected HbA1c for the next 3 months (GMI) is 7.4%. -Reviewing the CGM  trends, sugars appear to be higher especially increasing after breakfast and remaining fairly elevated throughout the day, dropping before dinner.  This could be related to her recent COVID-19 infection and also coming off Ozempic .  She agrees to restart Ozempic  and I advised her to start at a lower dose and increase as tolerated.  I did not suggest a change in pump settings for now otherwise, but I advised her to let me know if she has low blood sugars after starting Ozempic  so we can reduce her basal rate at that time. -I advised her to:  Patient Instructions  Please use the following pump settings: - basal rates: 12 am: 1.25  3 am: 1.15 6 am: 1.15 11 am: 1.20 5 pm: 1.15 9 pm: 1.30 - ICR:   12 am: 9  5 am: 6  3 pm : 6 - target:  12 am: 110-120 - ISF:  12 am: 45  5:30 am: 45  9:30 pm: 45 - Insulin  on Board: 4h - bolus wizard: on   Try to start the meal with fiber, protein and fat and end with carbs. Enter 40-50% protein grams for a low carb meal.  Please do the following 15 min before a meal: - Enter all carbs (C) - Enter sugars (S) - Start insulin  bolus (I)  Please restart: - Ozempic  0.25 mg weekly and increase to 0.5 mg weekly when able    Please continue levothyroxine  125 mcg daily.  Take the thyroid  hormone every day, with water, at least 30 minutes before breakfast, separated by at least 4 hours from: - acid reflux medications - calcium - iron - multivitamins   Try to start a multivitamin.   Please stop at the lab.  Please return in 4 months.  - we checked her HbA1c: 6.7% (higher) - advised to check sugars at different times of the day - 4x a day, rotating check times - advised for yearly eye exams >> she is UTD - return to clinic in 4 months  3. Hypothyroidism - thyroid  labs reviewed with pt. >> at last visit, TSH was very elevated: 10/22/2023: TSH 59.8 - we increased her levothyroxine  dose to 125 mcg daily.  She did not return for labs after this but  she had labs outside 12/10/2023, which we reviewed together today: TSH was at goal, at 0.8. - At today's visit, however, she is feeling very poorly with increased fatigue, brain fog, and muscle cramps.  Reviewing her most recent labs from 12/10/2023, she had a very low vitamin B12, at 220, and she just started B12 injections afterwards.  Her mother is getting them to her at home.  We discussed about injecting this once a week for 4 weeks and then switching to once a month.  He needs to be consistent with these, as otherwise her B12 level will decrease again.  Also, ferritin was extremely low, at 5.  She is not completely consistent with iron and we discussed about trying to take the supplement consistently.  She also got an iron fish, which she started to use for cooking.  Before last visit she was able to lose 17 pounds on  Ozempic  despite a high degree of hypothyroidism.  Weight is stable afterwards despite the fact that she came off Ozempic  recently due to her COVID-19 infection, and previously gastroenteritis.  No GI symptoms now. - we discussed about taking the thyroid  hormone every day, with water, >30 minutes before breakfast, separated by >4 hours from acid reflux medications, calcium, iron, multivitamins. Pt. is taking it correctly.   - will check thyroid  tests today: TSH and fT4 to make sure that she is not becoming thyrotoxic, which can also cause increased fatigue - If labs are abnormal, she will need to return for repeat TFTs in 1.5 months  4.  Fatigue - Please see problem #3 - Will also check a vitamin D  level today  Orders Placed This Encounter  Procedures   TSH   T4, free   Vitamin D , 25-hydroxy   Lela Fendt, MD PhD [facility]

## 2024-01-05 NOTE — Patient Instructions (Addendum)
 Please use the following pump settings: - basal rates: 12 am: 1.25  3 am: 1.15 6 am: 1.15 11 am: 1.20 5 pm: 1.15 9 pm: 1.30 - ICR:   12 am: 9  5 am: 6  3 pm : 6 - target:  12 am: 110-120 - ISF:  12 am: 45  5:30 am: 45  9:30 pm: 45 - Insulin  on Board: 4h - bolus wizard: on   Try to start the meal with fiber, protein and fat and end with carbs. Enter 40-50% protein grams for a low carb meal.  Please do the following 15 min before a meal: - Enter all carbs (C) - Enter sugars (S) - Start insulin  bolus (I)  Please restart: - Ozempic  0.25 mg weekly and increase to 0.5 mg weekly when able    Please continue levothyroxine  125 mcg daily.  Take the thyroid  hormone every day, with water, at least 30 minutes before breakfast, separated by at least 4 hours from: - acid reflux medications - calcium - iron - multivitamins   Try to start a multivitamin.   Please stop at the lab.  Please return in 4 months.

## 2024-01-06 ENCOUNTER — Ambulatory Visit: Payer: Self-pay | Admitting: Internal Medicine

## 2024-01-06 LAB — T4, FREE: Free T4: 1.5 ng/dL (ref 0.8–1.8)

## 2024-01-06 LAB — VITAMIN D 25 HYDROXY (VIT D DEFICIENCY, FRACTURES): Vit D, 25-Hydroxy: 35 ng/mL (ref 30–100)

## 2024-01-06 LAB — TSH: TSH: 0.66 m[IU]/L

## 2024-01-17 ENCOUNTER — Other Ambulatory Visit (HOSPITAL_COMMUNITY): Payer: Self-pay

## 2024-01-17 ENCOUNTER — Encounter: Payer: Self-pay | Admitting: Pharmacist

## 2024-01-17 ENCOUNTER — Other Ambulatory Visit: Payer: Self-pay

## 2024-01-19 ENCOUNTER — Ambulatory Visit: Admitting: Podiatry

## 2024-01-24 ENCOUNTER — Encounter: Payer: Self-pay | Admitting: Podiatry

## 2024-01-24 ENCOUNTER — Ambulatory Visit: Admitting: Podiatry

## 2024-01-24 DIAGNOSIS — L600 Ingrowing nail: Secondary | ICD-10-CM

## 2024-01-24 DIAGNOSIS — E109 Type 1 diabetes mellitus without complications: Secondary | ICD-10-CM

## 2024-01-24 NOTE — Progress Notes (Signed)
 Subjective:  Patient ID: Melissa Reese, female    DOB: 07-Aug-2000,   MRN: [id number]  Chief Complaint  Patient presents with   Diabetes    I have an ingrown toenail.  Saw Dr. Trixie - 01/05/2024; A1c - 6.7    23 y.o. female presents for concern of left ingrown toenail that has been present for quite a while now.  She relates tenderness around the area.  She is type I diabetic and her last A1c was 6.7.  She is here to have it treated.  She also relates concern of left third toe being thickened.. Denies any other pedal complaints. Denies n/v/f/c.   Past Medical History:  Diagnosis Date   Anemia 2018   Diabetes mellitus without complication (HCC)    Heart murmur    Thyroid  disease     Objective:  Physical Exam: Vascular: DP/PT pulses 2/4 bilateral. CFT <3 seconds. Normal hair growth on digits. No edema.  Skin. No lacerations or abrasions bilateral feet.  Incurvation of lateral border of left hallux with tenderness to palpation.  Mild edema.  Mild erythema.  No purulence noted.  Dystrophic changes to the left third digit nail. Musculoskeletal: MMT 5/5 bilateral lower extremities in DF, PF, Inversion and Eversion. Deceased ROM in DF of ankle joint.  Neurological: Sensation intact to light touch.   Assessment:   1. Ingrown left greater toenail   2. Type 1 diabetes mellitus without complications (HCC)      Plan:  Patient was evaluated and treated and all questions answered. Discussed ingrown toenails etiology and treatment options including procedure for removal vs conservative care.  Patient requesting removal of ingrown nail today. Procedure below.  Discussed procedure and post procedure care and patient expressed understanding.  Discussed treatment options for dystrophic toenails.  Discussed this is from previous injury likely.  Nail emollients and potential removal of the nail.  Patient defers at this time. Will follow-up in 2 weeks for nail check or sooner if any problems  arise.    Procedure:  Procedure: partial Nail Avulsion of left hallux lateral nail border.  Surgeon: Asberry Failing, DPM  Pre-op Dx: Ingrown toenail with infection Post-op: Same  Place of Surgery: Office exam room.  Indications for surgery: Painful and ingrown toenail.    The patient is requesting removal of nail with  chemical matrixectomy. Risks and complications were discussed with the patient for which they understand and written consent was obtained. Under sterile conditions a total of 3 mL of  1% lidocaine  plain was infiltrated in a hallux block fashion. Once anesthetized, the skin was prepped in sterile fashion. A tourniquet was then applied. Next the lateral aspect of hallux nail border was then sharply excised making sure to remove the entire offending nail border.  Next phenol was then applied under standard conditions to permanently destroy the matrix and copiously irrigated. Silvadene was applied. A dry sterile dressing was applied. After application of the dressing the tourniquet was removed and there is found to be an immediate capillary refill time to the digit. The patient tolerated the procedure well without any complications. Post procedure instructions were discussed the patient for which he verbally understood. Follow-up in two weeks for nail check or sooner if any problems are to arise. Discussed signs/symptoms of infection and directed to call the office immediately should any occur or go directly to the emergency room. In the meantime, encouraged to call the office with any questions, concerns, changes symptoms.   Asberry Failing, DPM

## 2024-01-24 NOTE — Patient Instructions (Signed)

## 2024-02-07 ENCOUNTER — Ambulatory Visit: Admitting: Podiatry

## 2024-02-07 ENCOUNTER — Other Ambulatory Visit: Payer: Self-pay | Admitting: Internal Medicine

## 2024-02-07 ENCOUNTER — Other Ambulatory Visit (HOSPITAL_COMMUNITY): Payer: Self-pay

## 2024-02-07 ENCOUNTER — Encounter: Payer: Self-pay | Admitting: Podiatry

## 2024-02-07 DIAGNOSIS — L600 Ingrowing nail: Secondary | ICD-10-CM

## 2024-02-07 DIAGNOSIS — E109 Type 1 diabetes mellitus without complications: Secondary | ICD-10-CM

## 2024-02-07 NOTE — Progress Notes (Signed)
  Subjective:  Patient ID: Melissa Reese, female    DOB: 21-Jul-2000,   MRN: [id number]  Chief Complaint  Patient presents with   Nail Problem    It's not hurting.  I'm not sure how it's supposed to look.  I been still soaking it and putting a bandage on it.  It can be a little uncomfortable at times.    23 y.o. female presents for follow-up of ingrown nail. Relates doing well and soaking as instructed. . Denies any other pedal complaints. Denies n/v/f/c.   Past Medical History:  Diagnosis Date   Anemia 2018   Diabetes mellitus without complication (HCC)    Heart murmur    Thyroid  disease     Objective:  Physical Exam: Vascular: DP/PT pulses 2/4 bilateral. CFT <3 seconds. Normal hair growth on digits. No edema.  Skin. No lacerations or abrasions bilateral feet. Left hallux nail healing well.  Dystrophic changes to the left third digit nail. Musculoskeletal: MMT 5/5 bilateral lower extremities in DF, PF, Inversion and Eversion. Deceased ROM in DF of ankle joint.  Neurological: Sensation intact to light touch.   Assessment:   1. Ingrown left greater toenail   2. Type 1 diabetes mellitus without complications (HCC)       Plan:  Patient was evaluated and treated and all questions answered. Toe was evaluated and appears to be healing well.  May discontinue soaks and neosporin.  Patient to follow-up as needed.     Asberry Failing, DPM

## 2024-02-08 ENCOUNTER — Encounter (HOSPITAL_COMMUNITY): Payer: Self-pay

## 2024-02-08 ENCOUNTER — Other Ambulatory Visit (HOSPITAL_COMMUNITY): Payer: Self-pay

## 2024-02-08 ENCOUNTER — Other Ambulatory Visit: Payer: Self-pay

## 2024-02-08 ENCOUNTER — Other Ambulatory Visit (HOSPITAL_BASED_OUTPATIENT_CLINIC_OR_DEPARTMENT_OTHER): Payer: Self-pay

## 2024-02-09 ENCOUNTER — Other Ambulatory Visit (HOSPITAL_COMMUNITY): Payer: Self-pay

## 2024-02-09 ENCOUNTER — Other Ambulatory Visit: Payer: Self-pay

## 2024-02-10 ENCOUNTER — Other Ambulatory Visit (HOSPITAL_COMMUNITY): Payer: Self-pay

## 2024-02-10 MED ORDER — BAQSIMI ONE PACK 3 MG/DOSE NA POWD
NASAL | 11 refills | Status: AC
  Filled 2024-02-10: qty 2, 2d supply, fill #0

## 2024-02-11 ENCOUNTER — Other Ambulatory Visit: Payer: Self-pay

## 2024-02-21 ENCOUNTER — Other Ambulatory Visit: Payer: Self-pay | Admitting: Internal Medicine

## 2024-02-21 ENCOUNTER — Other Ambulatory Visit: Payer: Self-pay | Admitting: Physician Assistant

## 2024-02-21 DIAGNOSIS — E538 Deficiency of other specified B group vitamins: Secondary | ICD-10-CM

## 2024-02-21 DIAGNOSIS — E1042 Type 1 diabetes mellitus with diabetic polyneuropathy: Secondary | ICD-10-CM

## 2024-02-22 ENCOUNTER — Other Ambulatory Visit (HOSPITAL_COMMUNITY): Payer: Self-pay

## 2024-02-22 ENCOUNTER — Other Ambulatory Visit: Payer: Self-pay

## 2024-02-22 MED ORDER — OMNIPOD 5 DEXG7G6 PODS GEN 5 MISC
1.0000 | 3 refills | Status: AC
  Filled 2024-02-22 – 2024-03-12 (×7): qty 30, 90d supply, fill #0
  Filled ????-??-??: fill #0

## 2024-02-28 ENCOUNTER — Ambulatory Visit: Admitting: Internal Medicine

## 2024-02-28 ENCOUNTER — Other Ambulatory Visit (HOSPITAL_BASED_OUTPATIENT_CLINIC_OR_DEPARTMENT_OTHER): Payer: Self-pay

## 2024-02-28 ENCOUNTER — Other Ambulatory Visit: Payer: Self-pay | Admitting: Physician Assistant

## 2024-02-28 ENCOUNTER — Encounter: Payer: Self-pay | Admitting: Internal Medicine

## 2024-02-28 DIAGNOSIS — E538 Deficiency of other specified B group vitamins: Secondary | ICD-10-CM

## 2024-02-28 MED ORDER — CYANOCOBALAMIN 1000 MCG/ML IJ SOLN
INTRAMUSCULAR | 7 refills | Status: AC
  Filled 2024-03-06: qty 1, 28d supply, fill #0

## 2024-03-01 ENCOUNTER — Other Ambulatory Visit (HOSPITAL_COMMUNITY): Payer: Self-pay

## 2024-03-01 MED ORDER — FREESTYLE LANCETS MISC
12 refills | Status: AC
  Filled 2024-03-01: qty 100, 33d supply, fill #0
  Filled 2024-03-10: qty 200, 67d supply, fill #0

## 2024-03-01 MED ORDER — ACCU-CHEK GUIDE TEST VI STRP
ORAL_STRIP | 12 refills | Status: AC
  Filled 2024-03-01: qty 100, 33d supply, fill #0
  Filled 2024-03-01: qty 250, 83d supply, fill #0
  Filled 2024-03-10: qty 200, 67d supply, fill #0

## 2024-03-01 MED ORDER — FREESTYLE LITE W/DEVICE KIT
PACK | 0 refills | Status: AC
  Filled 2024-03-01 – 2024-03-10 (×2): qty 1, 30d supply, fill #0

## 2024-03-03 LAB — HEPATIC FUNCTION PANEL
ALT: 10 U/L (ref 7–35)
AST: 18 (ref 13–35)
Alkaline Phosphatase: 60 (ref 25–125)
Bilirubin, Total: 0.3

## 2024-03-03 LAB — TSH: TSH: 3 (ref 0.41–5.90)

## 2024-03-03 LAB — BASIC METABOLIC PANEL WITH GFR
BUN: 10 (ref 4–21)
CO2: 21 (ref 13–22)
Chloride: 99 (ref 99–108)
Creatinine: 0.8 (ref 0.5–1.1)
Glucose: 198
Potassium: 4.2 meq/L (ref 3.5–5.1)
Sodium: 136 — AB (ref 137–147)

## 2024-03-03 LAB — CBC AND DIFFERENTIAL
HCT: 39 (ref 36–46)
Hemoglobin: 12.7 (ref 12.0–16.0)
Neutrophils Absolute: 5.1
Platelets: 235 K/uL (ref 150–400)
WBC: 7.8

## 2024-03-03 LAB — CBC: RBC: 4.74 (ref 3.87–5.11)

## 2024-03-03 LAB — HEMOGLOBIN A1C: Hemoglobin A1C: 6.4

## 2024-03-03 LAB — LIPID PANEL
Cholesterol: 181 (ref 0–200)
HDL: 64 (ref 35–70)
LDL Cholesterol: 93
Triglycerides: 138 (ref 40–160)

## 2024-03-03 LAB — IRON,TIBC AND FERRITIN PANEL
%SAT: 7
Iron: 28
TIBC: 407
UIBC: 28

## 2024-03-03 LAB — VITAMIN B12: Vitamin B-12: 376

## 2024-03-03 LAB — COMPREHENSIVE METABOLIC PANEL WITH GFR
Albumin: 4.2 (ref 3.5–5.0)
Globulin: 2.8

## 2024-03-04 ENCOUNTER — Encounter: Payer: Self-pay | Admitting: Physician Assistant

## 2024-03-04 LAB — LAB REPORT - SCANNED
EGFR: 103
Free T4: 1.5 ng/dL

## 2024-03-06 ENCOUNTER — Other Ambulatory Visit (HOSPITAL_COMMUNITY): Payer: Self-pay

## 2024-03-06 ENCOUNTER — Other Ambulatory Visit: Payer: Self-pay | Admitting: Physician Assistant

## 2024-03-06 ENCOUNTER — Other Ambulatory Visit (HOSPITAL_BASED_OUTPATIENT_CLINIC_OR_DEPARTMENT_OTHER): Payer: Self-pay

## 2024-03-06 ENCOUNTER — Ambulatory Visit: Payer: Self-pay

## 2024-03-06 ENCOUNTER — Encounter (HOSPITAL_BASED_OUTPATIENT_CLINIC_OR_DEPARTMENT_OTHER): Payer: Self-pay

## 2024-03-06 NOTE — Telephone Encounter (Signed)
 Lab results. Abstraction in process.

## 2024-03-07 ENCOUNTER — Other Ambulatory Visit (HOSPITAL_BASED_OUTPATIENT_CLINIC_OR_DEPARTMENT_OTHER): Payer: Self-pay

## 2024-03-09 ENCOUNTER — Other Ambulatory Visit (HOSPITAL_BASED_OUTPATIENT_CLINIC_OR_DEPARTMENT_OTHER): Payer: Self-pay

## 2024-03-10 ENCOUNTER — Other Ambulatory Visit (HOSPITAL_COMMUNITY): Payer: Self-pay

## 2024-03-10 ENCOUNTER — Other Ambulatory Visit (HOSPITAL_BASED_OUTPATIENT_CLINIC_OR_DEPARTMENT_OTHER): Payer: Self-pay

## 2024-03-10 ENCOUNTER — Other Ambulatory Visit: Payer: Self-pay

## 2024-03-11 ENCOUNTER — Other Ambulatory Visit (HOSPITAL_COMMUNITY): Payer: Self-pay

## 2024-03-12 ENCOUNTER — Other Ambulatory Visit: Payer: Self-pay

## 2024-03-12 ENCOUNTER — Encounter (HOSPITAL_COMMUNITY): Payer: Self-pay

## 2024-03-12 ENCOUNTER — Other Ambulatory Visit (HOSPITAL_COMMUNITY): Payer: Self-pay

## 2024-03-13 ENCOUNTER — Other Ambulatory Visit: Payer: Self-pay

## 2024-03-13 ENCOUNTER — Other Ambulatory Visit (HOSPITAL_COMMUNITY): Payer: Self-pay

## 2024-03-15 ENCOUNTER — Encounter: Payer: Self-pay | Admitting: Internal Medicine

## 2024-04-06 ENCOUNTER — Other Ambulatory Visit: Payer: Self-pay | Admitting: Internal Medicine

## 2024-04-06 ENCOUNTER — Encounter: Payer: Self-pay | Admitting: Internal Medicine

## 2024-04-06 ENCOUNTER — Other Ambulatory Visit (HOSPITAL_COMMUNITY): Payer: Self-pay

## 2024-04-06 ENCOUNTER — Other Ambulatory Visit: Payer: Self-pay

## 2024-04-06 ENCOUNTER — Other Ambulatory Visit (HOSPITAL_BASED_OUTPATIENT_CLINIC_OR_DEPARTMENT_OTHER): Payer: Self-pay

## 2024-04-06 DIAGNOSIS — E1042 Type 1 diabetes mellitus with diabetic polyneuropathy: Secondary | ICD-10-CM

## 2024-04-06 MED ORDER — INSULIN PEN NEEDLE 32G X 4 MM MISC
Freq: Four times a day (QID) | 3 refills | Status: AC
  Filled 2024-04-06: qty 100, 25d supply, fill #0

## 2024-04-06 MED ORDER — INSULIN LISPRO (1 UNIT DIAL) 100 UNIT/ML (KWIKPEN)
5.0000 [IU] | PEN_INJECTOR | Freq: Three times a day (TID) | SUBCUTANEOUS | 11 refills | Status: AC
  Filled 2024-04-06: qty 15, 50d supply, fill #0

## 2024-04-06 MED ORDER — INSULIN LISPRO (1 UNIT DIAL) 100 UNIT/ML (KWIKPEN)
5.0000 [IU] | PEN_INJECTOR | Freq: Three times a day (TID) | SUBCUTANEOUS | 11 refills | Status: DC
  Filled 2024-04-06: qty 15, 50d supply, fill #0

## 2024-04-06 MED ORDER — INSULIN PEN NEEDLE 32G X 4 MM MISC
Freq: Four times a day (QID) | 3 refills | Status: DC
  Filled 2024-04-06: qty 100, 25d supply, fill #0

## 2024-05-04 ENCOUNTER — Ambulatory Visit: Admitting: Internal Medicine

## 2024-07-31 ENCOUNTER — Encounter: Admitting: Physician Assistant
